# Patient Record
Sex: Female | Born: 1935 | Race: White | Hispanic: No | Marital: Married | Smoking: Former smoker
Health system: Southern US, Community
[De-identification: ages and names within clinical notes are randomized; demographics above are authoritative.]

## PROBLEM LIST (undated history)

## (undated) DIAGNOSIS — I2510 Atherosclerotic heart disease of native coronary artery without angina pectoris: Secondary | ICD-10-CM

## (undated) DIAGNOSIS — G9340 Encephalopathy, unspecified: Secondary | ICD-10-CM

## (undated) DIAGNOSIS — N390 Urinary tract infection, site not specified: Secondary | ICD-10-CM

## (undated) DIAGNOSIS — I7300 Raynaud's syndrome without gangrene: Secondary | ICD-10-CM

## (undated) DIAGNOSIS — M797 Fibromyalgia: Secondary | ICD-10-CM

## (undated) DIAGNOSIS — E785 Hyperlipidemia, unspecified: Secondary | ICD-10-CM

## (undated) DIAGNOSIS — I252 Old myocardial infarction: Secondary | ICD-10-CM

## (undated) DIAGNOSIS — E2740 Unspecified adrenocortical insufficiency: Secondary | ICD-10-CM

## (undated) DIAGNOSIS — C679 Malignant neoplasm of bladder, unspecified: Secondary | ICD-10-CM

## (undated) DIAGNOSIS — R600 Localized edema: Secondary | ICD-10-CM

## (undated) DIAGNOSIS — I10 Essential (primary) hypertension: Secondary | ICD-10-CM

## (undated) DIAGNOSIS — E119 Type 2 diabetes mellitus without complications: Secondary | ICD-10-CM

## (undated) DIAGNOSIS — G20 Parkinson's disease: Secondary | ICD-10-CM

## (undated) DIAGNOSIS — W19XXXA Unspecified fall, initial encounter: Secondary | ICD-10-CM

## (undated) DIAGNOSIS — K2270 Barrett's esophagus without dysplasia: Secondary | ICD-10-CM

## (undated) DIAGNOSIS — M1990 Unspecified osteoarthritis, unspecified site: Secondary | ICD-10-CM

## (undated) DIAGNOSIS — G20A1 Parkinson's disease without dyskinesia, without mention of fluctuations: Secondary | ICD-10-CM

## (undated) DIAGNOSIS — F419 Anxiety disorder, unspecified: Secondary | ICD-10-CM

## (undated) DIAGNOSIS — M419 Scoliosis, unspecified: Secondary | ICD-10-CM

## (undated) DIAGNOSIS — K279 Peptic ulcer, site unspecified, unspecified as acute or chronic, without hemorrhage or perforation: Secondary | ICD-10-CM

## (undated) DIAGNOSIS — Z8551 Personal history of malignant neoplasm of bladder: Secondary | ICD-10-CM

## (undated) HISTORY — PX: APPENDECTOMY: SHX54

## (undated) HISTORY — DX: Urinary tract infection, site not specified: N39.0

## (undated) HISTORY — PX: ABDOMINAL HYSTERECTOMY: SHX81

## (undated) HISTORY — PX: TONSILLECTOMY: SUR1361

---

## 1998-07-11 ENCOUNTER — Ambulatory Visit (HOSPITAL_COMMUNITY): Admission: RE | Admit: 1998-07-11 | Discharge: 1998-07-11 | Payer: Self-pay | Admitting: Gastroenterology

## 1998-08-30 ENCOUNTER — Ambulatory Visit (HOSPITAL_COMMUNITY): Admission: RE | Admit: 1998-08-30 | Discharge: 1998-08-30 | Payer: Self-pay | Admitting: Interventional Cardiology

## 2000-09-01 ENCOUNTER — Encounter: Admission: RE | Admit: 2000-09-01 | Discharge: 2000-09-01 | Payer: Self-pay | Admitting: Urology

## 2000-09-01 ENCOUNTER — Encounter: Payer: Self-pay | Admitting: Urology

## 2000-09-04 ENCOUNTER — Encounter: Payer: Self-pay | Admitting: Urology

## 2000-09-08 ENCOUNTER — Ambulatory Visit (HOSPITAL_COMMUNITY): Admission: RE | Admit: 2000-09-08 | Discharge: 2000-09-08 | Payer: Self-pay | Admitting: Urology

## 2000-09-08 ENCOUNTER — Encounter: Payer: Self-pay | Admitting: Urology

## 2001-06-15 ENCOUNTER — Encounter: Payer: Self-pay | Admitting: *Deleted

## 2001-06-15 ENCOUNTER — Ambulatory Visit (HOSPITAL_COMMUNITY): Admission: RE | Admit: 2001-06-15 | Discharge: 2001-06-15 | Payer: Self-pay | Admitting: *Deleted

## 2003-03-08 ENCOUNTER — Inpatient Hospital Stay (HOSPITAL_COMMUNITY): Admission: EM | Admit: 2003-03-08 | Discharge: 2003-03-11 | Payer: Self-pay | Admitting: Emergency Medicine

## 2003-03-08 ENCOUNTER — Encounter: Payer: Self-pay | Admitting: Emergency Medicine

## 2003-05-05 ENCOUNTER — Ambulatory Visit (HOSPITAL_COMMUNITY): Admission: RE | Admit: 2003-05-05 | Discharge: 2003-05-05 | Payer: Self-pay | Admitting: Family Medicine

## 2003-05-05 ENCOUNTER — Encounter: Payer: Self-pay | Admitting: Family Medicine

## 2003-05-11 ENCOUNTER — Ambulatory Visit (HOSPITAL_COMMUNITY): Admission: RE | Admit: 2003-05-11 | Discharge: 2003-05-11 | Payer: Self-pay | Admitting: Gastroenterology

## 2003-05-12 ENCOUNTER — Encounter (INDEPENDENT_AMBULATORY_CARE_PROVIDER_SITE_OTHER): Payer: Self-pay | Admitting: Specialist

## 2003-07-18 ENCOUNTER — Encounter: Payer: Self-pay | Admitting: *Deleted

## 2003-07-18 ENCOUNTER — Ambulatory Visit (HOSPITAL_COMMUNITY): Admission: RE | Admit: 2003-07-18 | Discharge: 2003-07-18 | Payer: Self-pay | Admitting: *Deleted

## 2003-08-21 ENCOUNTER — Ambulatory Visit (HOSPITAL_COMMUNITY): Admission: RE | Admit: 2003-08-21 | Discharge: 2003-08-21 | Payer: Self-pay | Admitting: Interventional Cardiology

## 2003-09-04 ENCOUNTER — Encounter (HOSPITAL_COMMUNITY): Admission: RE | Admit: 2003-09-04 | Discharge: 2003-11-04 | Payer: Self-pay | Admitting: Interventional Cardiology

## 2004-02-23 ENCOUNTER — Encounter: Admission: RE | Admit: 2004-02-23 | Discharge: 2004-05-07 | Payer: Self-pay | Admitting: Neurology

## 2004-03-01 ENCOUNTER — Ambulatory Visit (HOSPITAL_COMMUNITY): Admission: RE | Admit: 2004-03-01 | Discharge: 2004-03-01 | Payer: Self-pay | Admitting: Family Medicine

## 2004-03-14 ENCOUNTER — Other Ambulatory Visit: Admission: RE | Admit: 2004-03-14 | Discharge: 2004-03-14 | Payer: Self-pay | Admitting: Obstetrics and Gynecology

## 2004-05-22 ENCOUNTER — Observation Stay (HOSPITAL_COMMUNITY): Admission: EM | Admit: 2004-05-22 | Discharge: 2004-05-23 | Payer: Self-pay | Admitting: Emergency Medicine

## 2004-10-16 ENCOUNTER — Encounter: Admission: RE | Admit: 2004-10-16 | Discharge: 2004-10-16 | Payer: Self-pay | Admitting: Neurology

## 2004-10-30 ENCOUNTER — Encounter: Admission: RE | Admit: 2004-10-30 | Discharge: 2004-10-30 | Payer: Self-pay | Admitting: Neurology

## 2004-11-14 ENCOUNTER — Encounter: Admission: RE | Admit: 2004-11-14 | Discharge: 2004-11-14 | Payer: Self-pay | Admitting: Neurology

## 2004-12-28 ENCOUNTER — Observation Stay (HOSPITAL_COMMUNITY): Admission: EM | Admit: 2004-12-28 | Discharge: 2004-12-30 | Payer: Self-pay | Admitting: Emergency Medicine

## 2005-02-11 ENCOUNTER — Ambulatory Visit (HOSPITAL_COMMUNITY): Admission: RE | Admit: 2005-02-11 | Discharge: 2005-02-11 | Payer: Self-pay | Admitting: Interventional Cardiology

## 2005-08-16 ENCOUNTER — Emergency Department (HOSPITAL_COMMUNITY): Admission: EM | Admit: 2005-08-16 | Discharge: 2005-08-16 | Payer: Self-pay | Admitting: Emergency Medicine

## 2006-11-20 ENCOUNTER — Ambulatory Visit (HOSPITAL_COMMUNITY): Admission: RE | Admit: 2006-11-20 | Discharge: 2006-11-20 | Payer: Self-pay | Admitting: Family Medicine

## 2008-02-21 ENCOUNTER — Inpatient Hospital Stay (HOSPITAL_COMMUNITY): Admission: EM | Admit: 2008-02-21 | Discharge: 2008-03-01 | Payer: Self-pay | Admitting: Emergency Medicine

## 2008-02-22 ENCOUNTER — Encounter (INDEPENDENT_AMBULATORY_CARE_PROVIDER_SITE_OTHER): Payer: Self-pay | Admitting: Internal Medicine

## 2008-03-04 ENCOUNTER — Ambulatory Visit: Payer: Self-pay | Admitting: Internal Medicine

## 2008-03-04 ENCOUNTER — Inpatient Hospital Stay (HOSPITAL_COMMUNITY): Admission: EM | Admit: 2008-03-04 | Discharge: 2008-03-14 | Payer: Self-pay | Admitting: Emergency Medicine

## 2008-03-08 ENCOUNTER — Ambulatory Visit: Payer: Self-pay | Admitting: Physical Medicine & Rehabilitation

## 2008-04-29 ENCOUNTER — Emergency Department (HOSPITAL_COMMUNITY): Admission: EM | Admit: 2008-04-29 | Discharge: 2008-04-29 | Payer: Self-pay | Admitting: Emergency Medicine

## 2008-05-08 ENCOUNTER — Inpatient Hospital Stay (HOSPITAL_COMMUNITY): Admission: EM | Admit: 2008-05-08 | Discharge: 2008-05-18 | Payer: Self-pay | Admitting: Emergency Medicine

## 2008-05-08 ENCOUNTER — Ambulatory Visit: Payer: Self-pay | Admitting: Pulmonary Disease

## 2008-05-09 ENCOUNTER — Ambulatory Visit: Payer: Self-pay | Admitting: Infectious Diseases

## 2008-05-11 ENCOUNTER — Encounter (INDEPENDENT_AMBULATORY_CARE_PROVIDER_SITE_OTHER): Payer: Self-pay | Admitting: Interventional Cardiology

## 2008-05-17 ENCOUNTER — Ambulatory Visit: Payer: Self-pay | Admitting: Physical Medicine & Rehabilitation

## 2008-09-09 ENCOUNTER — Emergency Department (HOSPITAL_COMMUNITY): Admission: EM | Admit: 2008-09-09 | Discharge: 2008-09-09 | Payer: Self-pay | Admitting: Emergency Medicine

## 2008-10-07 ENCOUNTER — Emergency Department (HOSPITAL_COMMUNITY): Admission: EM | Admit: 2008-10-07 | Discharge: 2008-10-07 | Payer: Self-pay | Admitting: Emergency Medicine

## 2008-10-28 ENCOUNTER — Emergency Department (HOSPITAL_COMMUNITY): Admission: EM | Admit: 2008-10-28 | Discharge: 2008-10-29 | Payer: Self-pay | Admitting: Emergency Medicine

## 2008-11-16 ENCOUNTER — Inpatient Hospital Stay (HOSPITAL_COMMUNITY): Admission: EM | Admit: 2008-11-16 | Discharge: 2008-11-16 | Payer: Self-pay | Admitting: Emergency Medicine

## 2008-11-16 ENCOUNTER — Encounter (INDEPENDENT_AMBULATORY_CARE_PROVIDER_SITE_OTHER): Payer: Self-pay | Admitting: Internal Medicine

## 2008-11-16 ENCOUNTER — Ambulatory Visit: Payer: Self-pay | Admitting: Surgery

## 2008-12-18 ENCOUNTER — Ambulatory Visit (HOSPITAL_COMMUNITY): Admission: RE | Admit: 2008-12-18 | Discharge: 2008-12-18 | Payer: Self-pay | Admitting: Orthopedic Surgery

## 2009-02-15 ENCOUNTER — Ambulatory Visit (HOSPITAL_COMMUNITY): Admission: RE | Admit: 2009-02-15 | Discharge: 2009-02-15 | Payer: Self-pay | Admitting: Urology

## 2009-04-23 ENCOUNTER — Ambulatory Visit (HOSPITAL_COMMUNITY): Admission: RE | Admit: 2009-04-23 | Discharge: 2009-04-23 | Payer: Self-pay | Admitting: Urology

## 2009-06-05 ENCOUNTER — Emergency Department (HOSPITAL_COMMUNITY): Admission: EM | Admit: 2009-06-05 | Discharge: 2009-06-05 | Payer: Self-pay | Admitting: Emergency Medicine

## 2009-06-18 ENCOUNTER — Encounter: Admission: RE | Admit: 2009-06-18 | Discharge: 2009-06-18 | Payer: Self-pay | Admitting: Family Medicine

## 2009-06-24 ENCOUNTER — Inpatient Hospital Stay (HOSPITAL_COMMUNITY): Admission: EM | Admit: 2009-06-24 | Discharge: 2009-06-27 | Payer: Self-pay | Admitting: Emergency Medicine

## 2009-07-30 ENCOUNTER — Ambulatory Visit (HOSPITAL_COMMUNITY): Admission: RE | Admit: 2009-07-30 | Discharge: 2009-07-30 | Payer: Self-pay | Admitting: Urology

## 2010-01-15 ENCOUNTER — Ambulatory Visit (HOSPITAL_COMMUNITY): Admission: RE | Admit: 2010-01-15 | Discharge: 2010-01-15 | Payer: Self-pay | Admitting: Urology

## 2010-01-18 ENCOUNTER — Ambulatory Visit (HOSPITAL_COMMUNITY): Admission: RE | Admit: 2010-01-18 | Discharge: 2010-01-18 | Payer: Self-pay | Admitting: Orthopedic Surgery

## 2010-11-23 ENCOUNTER — Encounter: Payer: Self-pay | Admitting: Neurology

## 2011-01-26 LAB — CBC
HCT: 34.2 % — ABNORMAL LOW (ref 36.0–46.0)
Hemoglobin: 11 g/dL — ABNORMAL LOW (ref 12.0–15.0)
RBC: 3.72 MIL/uL — ABNORMAL LOW (ref 3.87–5.11)
RDW: 15.9 % — ABNORMAL HIGH (ref 11.5–15.5)

## 2011-01-26 LAB — BASIC METABOLIC PANEL
CO2: 25 mEq/L (ref 19–32)
Calcium: 8.9 mg/dL (ref 8.4–10.5)
GFR calc Af Amer: 42 mL/min — ABNORMAL LOW (ref >60)
GFR calc non Af Amer: 34 mL/min — ABNORMAL LOW (ref >60)
Glucose, Bld: 105 mg/dL — ABNORMAL HIGH (ref 70–99)
Potassium: 5 mEq/L (ref 3.5–5.1)
Sodium: 139 mEq/L (ref 135–145)

## 2011-02-07 LAB — BASIC METABOLIC PANEL
BUN: 75 mg/dL — ABNORMAL HIGH (ref 6–23)
CO2: 31 mEq/L (ref 19–32)
Glucose, Bld: 97 mg/dL (ref 70–99)
Potassium: 4.4 mEq/L (ref 3.5–5.1)
Sodium: 141 mEq/L (ref 135–145)

## 2011-02-07 LAB — URINE CULTURE: Colony Count: 60000

## 2011-02-08 LAB — COMPREHENSIVE METABOLIC PANEL
ALT: 10 U/L (ref 0–35)
ALT: 17 U/L (ref 0–35)
Albumin: 3 g/dL — ABNORMAL LOW (ref 3.5–5.2)
Alkaline Phosphatase: 60 U/L (ref 39–117)
BUN: 63 mg/dL — ABNORMAL HIGH (ref 6–23)
CO2: 21 mEq/L (ref 19–32)
Calcium: 8.7 mg/dL (ref 8.4–10.5)
Chloride: 101 mEq/L (ref 96–112)
GFR calc Af Amer: 23 mL/min — ABNORMAL LOW (ref >60)
Glucose, Bld: 108 mg/dL — ABNORMAL HIGH (ref 70–99)
Glucose, Bld: 77 mg/dL (ref 70–99)
Potassium: 4.2 mEq/L (ref 3.5–5.1)
Potassium: 5 mEq/L (ref 3.5–5.1)
Sodium: 128 mEq/L — ABNORMAL LOW (ref 135–145)
Sodium: 131 mEq/L — ABNORMAL LOW (ref 135–145)
Total Bilirubin: 0.8 mg/dL (ref 0.3–1.2)
Total Protein: 5.8 g/dL — ABNORMAL LOW (ref 6.0–8.3)
Total Protein: 6 g/dL (ref 6.0–8.3)

## 2011-02-08 LAB — CULTURE, BLOOD (ROUTINE X 2)

## 2011-02-08 LAB — CBC
HCT: 30.1 % — ABNORMAL LOW (ref 36.0–46.0)
HCT: 32.9 % — ABNORMAL LOW (ref 36.0–46.0)
Hemoglobin: 10.3 g/dL — ABNORMAL LOW (ref 12.0–15.0)
Hemoglobin: 10.5 g/dL — ABNORMAL LOW (ref 12.0–15.0)
Hemoglobin: 11.1 g/dL — ABNORMAL LOW (ref 12.0–15.0)
MCHC: 34.2 g/dL (ref 30.0–36.0)
MCHC: 33.7 g/dL (ref 30.0–36.0)
MCV: 94.4 fL (ref 78.0–100.0)
Platelets: 209 10*3/uL (ref 150–400)
Platelets: 254 10*3/uL (ref 150–400)
Platelets: 227 K/uL (ref 150–400)
RBC: 2.94 MIL/uL — ABNORMAL LOW (ref 3.87–5.11)
RBC: 3.21 MIL/uL — ABNORMAL LOW (ref 3.87–5.11)
RBC: 3.49 MIL/uL — ABNORMAL LOW (ref 3.87–5.11)
RDW: 13.6 % (ref 11.5–15.5)
RDW: 13.7 % (ref 11.5–15.5)
RDW: 13.8 % (ref 11.5–15.5)
RDW: 13.6 % (ref 11.5–15.5)
WBC: 5.1 10*3/uL (ref 4.0–10.5)
WBC: 9.4 10*3/uL (ref 4.0–10.5)
WBC: 9.6 10*3/uL (ref 4.0–10.5)
WBC: 4.5 10*3/uL (ref 4.0–10.5)

## 2011-02-08 LAB — POCT I-STAT, CHEM 8
BUN: 34 mg/dL — ABNORMAL HIGH (ref 6–23)
Calcium, Ion: 1.29 mmol/L (ref 1.12–1.32)
Chloride: 108 mEq/L (ref 96–112)
Creatinine, Ser: 1.3 mg/dL — ABNORMAL HIGH (ref 0.4–1.2)
Glucose, Bld: 101 mg/dL — ABNORMAL HIGH (ref 70–99)
HCT: 34 % — ABNORMAL LOW (ref 36.0–46.0)
Hemoglobin: 11.6 g/dL — ABNORMAL LOW (ref 12.0–15.0)
Potassium: 4.7 meq/L (ref 3.5–5.1)
Sodium: 141 meq/L (ref 135–145)
TCO2: 23 mmol/L (ref 0–100)

## 2011-02-08 LAB — BASIC METABOLIC PANEL
BUN: 40 mg/dL — ABNORMAL HIGH (ref 6–23)
Calcium: 7.8 mg/dL — ABNORMAL LOW (ref 8.4–10.5)
Calcium: 8.2 mg/dL — ABNORMAL LOW (ref 8.4–10.5)
Creatinine, Ser: 1.04 mg/dL (ref 0.4–1.2)
GFR calc Af Amer: >60 mL/min (ref >60)
GFR calc non Af Amer: 38 mL/min — ABNORMAL LOW (ref >60)
Glucose, Bld: 78 mg/dL (ref 70–99)
Sodium: 139 mEq/L (ref 135–145)

## 2011-02-08 LAB — URINALYSIS, ROUTINE W REFLEX MICROSCOPIC
Bilirubin Urine: NEGATIVE
Nitrite: NEGATIVE
Specific Gravity, Urine: 1.014 (ref 1.005–1.030)
Urobilinogen, UA: 0.2 mg/dL (ref 0.0–1.0)
pH: 5.5 (ref 5.0–8.0)

## 2011-02-08 LAB — DIFFERENTIAL
Basophils Absolute: 0 K/uL (ref 0.0–0.1)
Basophils Relative: 1 % (ref 0–1)
Eosinophils Absolute: 0 10*3/uL (ref 0.0–0.7)
Eosinophils Absolute: 0 K/uL (ref 0.0–0.7)
Eosinophils Relative: 0 % (ref 0–5)
Lymphocytes Relative: 29 % (ref 12–46)
Lymphs Abs: 1 10*3/uL (ref 0.7–4.0)
Lymphs Abs: 1.3 10*3/uL (ref 0.7–4.0)
Monocytes Absolute: 0.8 10*3/uL (ref 0.1–1.0)
Monocytes Absolute: 0.5 10*3/uL (ref 0.1–1.0)
Monocytes Relative: 8 % (ref 3–12)
Monocytes Relative: 11 % (ref 3–12)
Neutro Abs: 2.7 K/uL (ref 1.7–7.7)
Neutrophils Relative %: 83 % — ABNORMAL HIGH (ref 43–77)
Neutrophils Relative %: 60 % (ref 43–77)

## 2011-02-08 LAB — URINE MICROSCOPIC-ADD ON

## 2011-02-08 LAB — D-DIMER, QUANTITATIVE: D-Dimer, Quant: 0.25 ug/mL-FEU (ref 0.00–0.48)

## 2011-02-08 LAB — LIPASE, BLOOD: Lipase: 11 U/L (ref 11–59)

## 2011-02-08 LAB — SODIUM, URINE, RANDOM: Sodium, Ur: 17 mEq/L

## 2011-02-08 LAB — T4, FREE: Free T4: 1.4 ng/dL (ref 0.80–1.80)

## 2011-02-08 LAB — OSMOLALITY, URINE: Osmolality, Ur: 503 mOsm/kg (ref 390–1090)

## 2011-02-08 LAB — CORTISOL: Cortisol, Plasma: 13.8 ug/dL

## 2011-02-08 LAB — CREATININE, URINE, RANDOM: Creatinine, Urine: 48.8 mg/dL

## 2011-02-08 LAB — URINE CULTURE

## 2011-02-10 LAB — BASIC METABOLIC PANEL
CO2: 26 mEq/L (ref 19–32)
Chloride: 107 mEq/L (ref 96–112)
Creatinine, Ser: 1.39 mg/dL — ABNORMAL HIGH (ref 0.4–1.2)
GFR calc Af Amer: 44 mL/min — ABNORMAL LOW (ref >60)

## 2011-02-10 LAB — HEMOGLOBIN AND HEMATOCRIT, BLOOD: HCT: 34.8 % — ABNORMAL LOW (ref 36.0–46.0)

## 2011-02-12 LAB — BASIC METABOLIC PANEL
CO2: 22 mEq/L (ref 19–32)
Calcium: 9.3 mg/dL (ref 8.4–10.5)
Creatinine, Ser: 1.07 mg/dL (ref 0.4–1.2)
GFR calc Af Amer: 60 mL/min — ABNORMAL LOW (ref >60)
GFR calc non Af Amer: 49 mL/min — ABNORMAL LOW (ref >60)
Glucose, Bld: 89 mg/dL (ref 70–99)

## 2011-02-12 LAB — GLUCOSE, CAPILLARY
Glucose-Capillary: 72 mg/dL (ref 70–99)
Glucose-Capillary: 84 mg/dL (ref 70–99)

## 2011-02-17 LAB — COMPREHENSIVE METABOLIC PANEL
ALT: 13 U/L (ref 0–35)
AST: 23 U/L (ref 0–37)
Calcium: 9.6 mg/dL (ref 8.4–10.5)
Creatinine, Ser: 1.23 mg/dL — ABNORMAL HIGH (ref 0.4–1.2)
GFR calc Af Amer: 51 mL/min — ABNORMAL LOW (ref >60)
Sodium: 140 mEq/L (ref 135–145)
Total Protein: 6.5 g/dL (ref 6.0–8.3)

## 2011-02-17 LAB — RAPID URINE DRUG SCREEN, HOSP PERFORMED
Amphetamines: NOT DETECTED
Benzodiazepines: POSITIVE — AB
Cocaine: NOT DETECTED
Tetrahydrocannabinol: NOT DETECTED

## 2011-02-17 LAB — BLOOD GAS, ARTERIAL
Acid-Base Excess: 1.7 mmol/L (ref 0.0–2.0)
Bicarbonate: 25.7 mEq/L — ABNORMAL HIGH (ref 20.0–24.0)
O2 Saturation: 94.6 %
TCO2: 23.4 mmol/L (ref 0–100)
pO2, Arterial: 72.3 mmHg — ABNORMAL LOW (ref 80.0–100.0)

## 2011-02-17 LAB — CK TOTAL AND CKMB (NOT AT ARMC)
CK, MB: 2.2 ng/mL (ref 0.3–4.0)
Relative Index: INVALID (ref 0.0–2.5)
Total CK: 38 U/L (ref 7–177)

## 2011-02-17 LAB — URINALYSIS, ROUTINE W REFLEX MICROSCOPIC
Ketones, ur: NEGATIVE mg/dL
Nitrite: NEGATIVE
Protein, ur: NEGATIVE mg/dL

## 2011-02-17 LAB — BASIC METABOLIC PANEL
CO2: 26 mEq/L (ref 19–32)
GFR calc non Af Amer: 35 mL/min — ABNORMAL LOW (ref >60)
Glucose, Bld: 108 mg/dL — ABNORMAL HIGH (ref 70–99)
Potassium: 4.3 mEq/L (ref 3.5–5.1)
Sodium: 138 mEq/L (ref 135–145)

## 2011-02-17 LAB — POCT CARDIAC MARKERS

## 2011-02-17 LAB — CBC
HCT: 38.4 % (ref 36.0–46.0)
Hemoglobin: 12.8 g/dL (ref 12.0–15.0)
RDW: 15.9 % — ABNORMAL HIGH (ref 11.5–15.5)

## 2011-02-17 LAB — DIFFERENTIAL
Basophils Absolute: 0.1 10*3/uL (ref 0.0–0.1)
Eosinophils Relative: 0 % (ref 0–5)
Lymphocytes Relative: 24 % (ref 12–46)
Lymphs Abs: 1.5 10*3/uL (ref 0.7–4.0)
Monocytes Absolute: 0.6 10*3/uL (ref 0.1–1.0)
Monocytes Relative: 10 % (ref 3–12)

## 2011-02-17 LAB — HEMOGLOBIN A1C: Hgb A1c MFr Bld: 6.3 % — ABNORMAL HIGH (ref 4.6–6.1)

## 2011-02-17 LAB — APTT: aPTT: 35 seconds (ref 24–37)

## 2011-02-17 LAB — GLUCOSE, CAPILLARY: Glucose-Capillary: 111 mg/dL — ABNORMAL HIGH (ref 70–99)

## 2011-02-17 LAB — PROTIME-INR
INR: 1.1 (ref 0.00–1.49)
Prothrombin Time: 14.7 seconds (ref 11.6–15.2)

## 2011-02-17 LAB — URINE CULTURE: Culture: NO GROWTH

## 2011-03-18 NOTE — Discharge Summary (Signed)
[name], [name]                 ACCOUNT NO.:  [id number]   MEDICAL RECORD NO.:  [id number]          PATIENT TYPE:  INP   LOCATION:  1438                         FACILITY:  [facility]   PHYSICIAN:  Hollice Espy, M.D.DATE OF BIRTH:  08/23/1928   DATE OF ADMISSION:  02/21/2008  DATE OF DISCHARGE:  03/01/2008                               DISCHARGE SUMMARY   PRIMARY CARE PHYSICIAN:  Dr. [name].   CONSULTANTS:  Dr. Avie Echevaria, neurology   DISCHARGE DIAGNOSES:  1. Urinary tract infection.  2. Septic shock.  3. Parkinson's with acute flare leading to joint rigidity.  4. Deconditioning.  5. Shortness of breath, possibly volume overload.  6. Protein/calorie malnutrition.  7. Anasarca, improving.  8. Diabetes mellitus.  9. History of coronary artery disease.  10.Hypertension.  11.Multiple drug allergies.   HOSPITAL COURSE:  1. In regard to the patient's UTI and sepsis, the patient has a      history of multiple drug allergies and is a 77 year old white      female who had been started on Macrobid for a UTI, but started to      feel worse despite the antibiotics started for several days as an      outpatient.  When she came to the emergency room on February 21, 2008,      she was found to be hypotensive with a blood pressure of 70/40.      She was given 4 liters of IV fluid and transiently started on      dopamine which increased her blood pressure 190/100, and heart rate      up to 140 and thus her dopamine was stopped.  As soon as her      dopamine was stopped, her blood pressure fell back down into the      70s systolic, so she was brought into the hospitalist service and      sent over to the ICU.  Because of her multiple drug allergies, the      patient was started on IV Zosyn.  Because of previous outpatient      antibiotics her urine cultures never grew anything positive, but      she showed all signs of systemic inflammatory response and early      signs of sepsis  from his urinary infection.  After being started on      dopamine and IV fluids for several days her pressure began to      improve.  She was able to be weaned off of dopamine and she      remained stable.  After several days, she was able to be      transferred up to the floor.  She remained stable from an      infectious disease standpoint and by March 01, 2008, will have      completed 10 days of IV Zosyn.  We have been unable to transition      her to a p.o. medication because we do not have cultures nor do we      have a  p.o. medication that she tolerated without possible allergy.  2. In regard to her Parkinson disease, the patient already had      problems prior to admission with muscle rigidity.  Dr. Sandria Manly from      neurology was consulted who saw the patient.  She previously had      been on selegiline and Neurontin.  The selegiline was restarted at      a lower dose and as patient has progressed during her      hospitalization from severe weakness where she could barely move      her legs and arms, to the point where she has increased      significantly in her upper body and upper extremities in terms of      strength, motor control and function, Dr. Sandria Manly has continued to      follow the patient and slightly adjusted her medications.  Please      see below for medication list.  The patient was evaluated by PT and      OT.  In spite of her improvement, she is felt to still be severely      deconditioned and will go from the hospital to a skilled nursing      facility for rehabilitation.  3. In regards to the patient's anasarca and protein/calorie      malnutrition, the patient was complaining of generalized swelling.      Her IV fluids were able to be weaned off after her renal failure      had improved.  Her initial renal failure was felt to be secondary      to hypotension and poor perfusion.  Her renal function was restored      to normal and over the next several several days,  her albumin      stores were checked and she was found be malnourished with an      albumin of 2.0.  It was felt likely that a lot of her edema was      secondary to third spacing of fluids from poor protein      malnutrition.  She was started on Megace which she has been      tolerating well.  Her appetite has slowly improved and she started      to do some auto diuresis.  She received multiple doses of Lasix in      addition and that has helped with some of her swelling.  4. In regard to the patient's renal failure, again this is felt to be      secondary to initial hypotension and shock.  With IV fluids this      has restored back to normal function.  5. In regard to her diabetes mellitus, metformin was held given her      poor p.o. intake, her hypoglycemia, her slightly elevated liver      enzymes on admission, and her poor functional state, along with her      history of CAD.  Her blood sugars have remained stable without      anything other than sliding-scale insulin, ranging anywhere from      the 110s to 150s.  Plan will be for the patient to at this time      discontinue her metformin, continue to watch her sugars and should      her CBG start to trend up, she may be started on different      medication, but we  will defer this to her PCP.  6. In regards to her history of CAD, the patient is continued on her      Plavix, simvastatin and aspirin.  This has been a stable medical      issues.  7. In regards to her shortness of breath, the patient was noted to be      complaining of some increased episodes of wheezing and shortness of      breath during her hospitalization for the first few days.  There      was a concern about aspiration, so we had a swallowing evaluation      which was negative, although they did recommend that especially      with her initial bed bound status that she sit upright, continue a      regular diet with thin liquids and supervision.  This has since       improved, and this did not appear to be an issue.  Initially there      was concern about the possibility of antibiotic allergy, however      she has remained on Zosyn with no other antibiotics and her      wheezing has resolved.  Also concerned about the possibility of      volume overload and with multiple doses of Lasix, however this did      not seem to change things either.  It is possible she may have been      having some trace aspiration with her limited strength, but once      she was able to sit more upright in a chair and eat, and with her      strength increased then she was able to feed herself.  The wheezing      and shortness breath seems to have resolved.  There were no signs      of any type of pneumonia based on x-ray.   From initial presentation, her disposition has improved.   ACTIVITY:  Her activity will be as per skilled nursing.   DIET:  Her discharge diet at this time will be carb-modified, heart  healthy diet.   FOLLOWUP:  She is to follow up with Dr. Sandria Manly and with Dr. Katrinka Blazing in the  next 1-2 weeks following discharge.   PLAN:  Plan will be for the patient go to skilled nursing facility.  Again her overall disposition is improved and she will follow up with  Dr. Sandria Manly as an outpatient.   DISCHARGE MEDICATIONS:  As of this dictation, her discharge medications  are as follows:  1. Xanax 0.5 p.o. q.h.s.  2. Neurontin 100 mg p.o. t.i.d.  3. Lisinopril 5 p.o. daily.  4. She was on p.o. Macrobid prior to coming in, this has been      discontinued as she completed a full 10-day course of IV Zosyn in      the hospital.  5. Metformin is recommended to be discontinued, as she has poor p.o.      intake and her history of CAD.  6. Prilosec 20 mg p.o. daily.  7. Plavix 75 p.o. daily.  8. Aspirin 81 p.o. daily.  9. Simvastatin 40 p.o. daily.  10.Mirapex 1 mg p.o. t.i.d. given at 7:00 a.m., 12:00 p.m. and 4:00      p.m., started by Dr. Sandria Manly.  11.Megace 40 mg p.o.  daily.  12.Eldepryl will be increased to 5 mg p.o. twice a day at 8:00 a.m.  and 12:00 p.m.  13.Sinemet one-half tablet of 25/100 mg p.o. t.i.d.  14.Albuterol inhaler 2 puffs four times a day p.r.n.      Hollice Espy, M.D.  Electronically Signed     SKK/MEDQ  D:  02/29/2008  T:  02/29/2008  Job:  [id number]   cc:   Genene Churn. Love, M.D.  Fax: [phone]   Dario Guardian, M.D.  Fax: [phone]

## 2011-03-18 NOTE — Consult Note (Signed)
[name], [name] NO.:  [id number]   MEDICAL RECORD NO.:  [id number]          PATIENT TYPE:  INP   LOCATION:  1238                         FACILITY:  [facility]   PHYSICIAN:  Genene Churn. Love, M.D.    DATE OF BIRTH:  1928/01/04   DATE OF CONSULTATION:  02/22/2008  DATE OF DISCHARGE:                                 CONSULTATION   CHIEF COMPLAINT:  This 77 year old right-handed white married female is  admitted to [facility] February 23, 2008 for evaluation of  hypotension and suspected urosepsis.   HISTORY OF PRESENT ILLNESS:  [name] has a history of generalized  weakness associated with dizziness and 24 hours of hematuria beginning  February 16, 2008.  She was seen by her personal physician, Dr. [name] and on Thursday February 18, 2008 by Dr. Darvin Neighbours urologist PA.  At  that time urine cultures were obtained.  She was not started on  antibiotics.  She was seen by me in my office on February 20, 2008, the  next day, at which time her white blood cell count turned out to be  greater than 20,000. Creatinine was over 2.5.  Her blood pressures was  in the 90-100 range.  She was thought most likely to have a urinary  tract infection.  She has multiple drug allergies and cultures were  still pending.  She was admitted February 21, 2008 with evidence of  urosepsis.  She was hypotensive with blood pressures in the 70-90  systolic range.  She was placed on Zosyn and a cortisol level was  obtained.  Her initial white blood cell count was 12,300 with hemoglobin  11.1 and platelets to 281 K.  Today on February 22, 2008 white blood cell  count 9800, hemoglobin 9.2, and the platelet count was 233 K.  Initial  sodium was 130, potassium 3.7, chloride 99.9, CO2 content 19 and glucose  of 137.  Her SGOT and SGPT were slightly elevated with values of 67 and  55 respectively which have become 70 and 55.  Her INR was 1.4.  It was  felt that she may have shock liver with mildly  elevated liver function  tests.  During the evening time she was treated with Zosyn and IV fluid  pushes for blood pressures in the 70 systolic range.   MEDICATIONS:  Her medications at home have been:  1. Lisinopril 5 mg one p.o. b.i.d.  2. Mirapex 1.5 mg t.i.d.  3. Selegiline 5 mg b.i.d.  4. Lipitor 10 mg daily.  5. Metoprolol 50 mg daily.  6. Alprazolam 0.5 mg nightly.  7. Plavix 75 mg per day.  8. Prilosec 20 mg per day.  9. Metformin 500 mg daily.  10.Zocor 40 mg daily.  11.Ocuvite vitamins.   Her medications in the hospital include:  1. Zosyn 3.375 grams q. 8 hours.  2. MiraLax 17 grams p.o. b.i.d.  3. Tylenol 650 mg q. 4 hours p.r.n.  4. Zocor 40 mg daily.  5. Xanax 0.5 mg nightly.  6. Sodium chloride 1000 mg q. 4 hours.  7. Protonix 40 mg daily.  8. Prinivil 5 mg daily.  9. Plavix 75 mg daily.  10.NovoLog 1-9 units subcu q. 4 hours.  11.Neurontin 100 mg p.o. t.i.d.  12.Lovenox 40 mg subcu daily.  13.Eldepryl 5 mg p.o. b.i.d.  14.Colace 100 mg b.i.d.  15.Aspirin 81 mg p.o. daily.   PHYSICAL EXAMINATION:  GENERAL:  Examination revealed a well-developed  white female.  VITAL SIGNS:  Blood pressure 74/36, heart rate was 63.  She was  afebrile.  NECK:  No bruits heard.  MENTAL STATUS:  She was alert and oriented x3.  She followed one, two  and three-step commands.  NEUROLOGIC:  Her cranial nerve examination revealed visual fields to be  full.  Both disks were seen and flat.  She is status post cataract  surgery bilaterally.  Extraocular movements were full.  Face was  symmetric.  Tongue was midline.  The uvula was midline.  Gags were  present.  Her voice was strong.  Her motor examination revealed mild  increased tone.  No cogwheeling.  Strength was 4+/5 in her upper and  lower extremities.  She had no resting tremor but had some mild  outstretched hand arm tremor.  She had absent ankle reflexes.  Right  plantar response was upgoing.  The left plantar response was  downgoing.   IMPRESSION:  1. Parkinson's.  Code 332.0.  2. Urosepsis with urinary tract infection and hematuria. Code 599.0.  3. History of coronary artery disease, code 429.2.  4. Multiple drug allergies to CIPRO, NITROFURANTOIN, OXYCODONE,      CODEINE, TRIMETHOPRIM, ETC.  5. Remote history of bladder cancer.  6. Hypotension.  7. Diabetes mellitus. Code 250.6.   PLAN:  Plan at this time is to restart her Parkinson's medicines at low  dose.           ______________________________  Genene Churn. Sandria Manly, M.D.     JML/MEDQ  D:  02/22/2008  T:  02/22/2008  Job:  [id number]

## 2011-03-18 NOTE — H&P (Signed)
[name], COTRELL                 ACCOUNT NO.:  [id number]   MEDICAL RECORD NO.:  [id number]          PATIENT TYPE:  INP   LOCATION:  0104                         FACILITY:  [facility]   PHYSICIAN:  Michiel Cowboy, MDDATE OF BIRTH:  Nov 17, 1927   DATE OF ADMISSION:  11/15/2008  DATE OF DISCHARGE:                              HISTORY & PHYSICAL   PRIMARY CARE Eliza Green:  Dario Guardian, M.D.   CHIEF COMPLAINT:  Altered mental status, vertigo, confusion.   Patient is a 77 year old female with a history of Parkinson's disorder,  recurrent urinary tract infection with a few admissions for urosepsis,  recurrent orthostatic hypotension, thought to be partially secondary to  Parkinson's disorder, and also secondary to adrenal insufficiency.  Patient had a lumbar fracture since her last discharge in July.  Has  lost over 30 pounds, felt secondary to decreased p.o. intake and overall  not feeling well.  The reason why her family brought her in today, the  past few days have been feeling somewhat more sleepy and overall not  well but in particular for the past 1 day, patient had a lot of vertigo,  difficulty in walking, speech which was in unfinished sentences, and a  few words spoken but otherwise not a full conversation.  The patient  denies any fevers but endorses some chills.  Had neck pain in the past  but not currently.  No meningismus.  No current headache.  No chest pain  or shortness of breath.  The patient endorses a poor appetite and a hard  time walking.  Also severe fatigue.  She feels like she is constantly  falling asleep.  A sensation of vertigo.  Otherwise review of systems is  unremarkable.   PAST MEDICAL HISTORY:  1. Arthritis.  2. History of bladder cancer.  3. History of fibromyalgia.  4. Hypertension.  5. History of paroxysmal atrial fibrillation while ill. No      anticoagulation at that time, given that a short duration of A fib,      but that should be  reconsidered if the patient develops prolonged      recurrent A fib.  6. Recurrent E. coli urinary tract infection.  7. Congestive heart failure with EF of 40-45%.  8. Diabetes mellitus, type 2, diet-controlled.  9. History of coronary artery disease, status post statin in 2005.  10.Drug-eluting stent placement.  11.History of severe scoliosis.  12.Parkinson's disease.  13.Adrenal insufficiency.  14.GERD.  15.History of Barrett's esophagus.  16.Raynaud's syndrome.  17.History of renal failure in the past.  Of note, per her physician,      she had a recent creatinine up to 2, now down to 1.5 after      encouragement of fluids.   SOCIAL HISTORY:  Patient lives with her husband, who recently had  undergone surgery.  Does not smoke or drink currently.   FAMILY HISTORY:  Noncontributory.   ALLERGIES:  CIPRO, CODEINE, HYDROCODONE, MACROBID, OXYCODONE,  PENICILLIN, SULFA, TRIMETHOPRIM.   MEDICATIONS:  1. Alprazolam 0.5 mg p.o. q.p.m.  2. Amantidine 100 mg at 1 p.m.  3. Sinemet 25/100 mg b.i.d.  4. Lisinopril 5 mg p.o. daily.  5. Mirapex 1 mg at 9 a.m. and 1 p.m. and 0.5 mg at 6 p.m.  6. __________ 5 mg p.o. at 9 a.m. and 1 p.m.  7. Zocor 40 mg daily.  8. Furosemide 40 mg daily.  9. Midodrine 5 mg b.i.d.  10.KCL supplement over the counter.  11.Aspirin 81 mg daily.  12.Calcium with vitamin D.  13.Beta carotene.  14.Prilosec as needed.  15.Ocuvite.  16.Ferrous sulfate 325 mg daily.  17.Multivitamins.  18.Tums.  19.Patient uses p.r.n. hydrocodone, Skelaxin, Senna, and albuterol.  20.Patient is currently holding Neurontin and hydrochlorothiazide.   PHYSICAL EXAMINATION:  VITALS:  Temperature 95.9, blood pressure 114/63,  now up to 181/66.  Patient has a history of labile blood pressure.  Pulse 67, respirations 22, satting at 97% on room air.  Patient appears to be in no acute distress.  Currently, she is sleeping  but arousable.  Able to carry on a conversation, drifts off  back to  sleep.  Head nontraumatic.  Somewhat dry mucous membranes.  LUNGS:  Clear to auscultation bilaterally.  HEART:  Regular rate and rhythm.  No murmurs, rubs or gallops.  ABDOMEN:  Soft, nontender, nondistended.  LOWER EXTREMITIES:  Without clubbing, cyanosis or edema.  Patient appears to be neurologically intact.   LABS:  White blood cell count 6.2, hemoglobin 12.8.  Sodium 138,  potassium 4.3, creatinine 1.45, calcium 10.9.  UTOX positive for  benzodiazepines, which patient is prescribed.  Alcohol level less than  5.  UA negative.  ABGs 7.421/40/72.3.   CT scan of the head showed left maxillary sinus disease.   CT scan of the neck unchanged from prior.   Chest x-ray showing cardiomegaly, otherwise unremarkable.   Of note, no EKG was obtained.   ASSESSMENT/PLAN:  This is a 77 year old female with recurrent urinary  tract infections and overall loss of weight.  New symptoms of vertigo  and confusion.  Unsteady gait and difficulty speaking as well as a  history of paroxysmal atrial fibrillation in the past and a history of  severe Parkinson's disease and renal insufficiency with a labile blood  pressure.  1. Altered mental status:  Differential could  be brought, could be      underlying infection, although currently no sources.  Will hold off      antibiotics and to determine what the sources.  Also could be the      possibility related to a transient ischemic attack and      cerebrovascular accident, given unsteady gait, which is new, as      well as difficulty in speaking well.  Had a TIA/CVA workup done.      Patient had recent echocardiogram.  Will skip that but will do an      MRI/MRA of the brain.  Carotid Dopplers.  Patient has a history of      A fib in the past.  Will obtain a 12-lead EKG to confirm sinus      rhythm.  Does not appear to be irregular currently.  Will admit to      telemetry.  Cycle cardiac enzymes.  This could be typical anginal      symptoms,  consistent with myocardial infarction, but much less      likely.  Check fasting lipid panel, hemoglobin A1C.  Check TSH.  2. History of diabetes:  Will continue sliding scale.  3. Labile systolic blood pressure:  Will  avoid over-treatment.      Continue home meds.  Otherwise will not start on anything new.      Patient usually has very frequent fluctuations of her blood      pressure throughout the day.  4. Acute renal failure:  By comparison to labs before, seemed to be      improving, since down from 2 to 1.5.  Will continue gentle IV      fluids.  Will obtain renal ultrasound in the a.m. to determine if      there is any evidence of obstruction or current nephrotoxics that      could contribute to this.  5. Weight loss:  Patient will likely need to have a workup done for      this.  Will check morning pre-albumin to see where patient's stands      nutrionally.  Consider nutrition consult.  Also consider cancer      workup.  6. Parkinson's:  Continue her home medications as prescribed.  7. Prophylaxis:  Protonix.  SCDs.  8. Code status:  Per family, patient is DNR/DNI but allowed placement      of central line the last admission.      Michiel Cowboy, MD  Electronically Signed     AVD/MEDQ  D:  11/16/2008  T:  11/16/2008  Job:  [id number]   cc:   Dario Guardian, M.D.  Fax: [phone]

## 2011-03-18 NOTE — Op Note (Signed)
NAMEKEYERRA, LAMERE                 ACCOUNT NO.:  [id number]   MEDICAL RECORD NO.:  [id number]          PATIENT TYPE:  AMB   LOCATION:  DAY                          FACILITY[facility]  [facility]   PHYSICIAN:  Martina Sinner, MD DATE OF BIRTH:  [birth year]   DATE OF PROCEDURE:  02/15/2009  DATE OF DISCHARGE:                               OPERATIVE REPORT   PREOPERATIVE DIAGNOSIS:  Neurogenic bladder, refractory urge  incontinence, muscle spasm.   POSTOPERATIVE DIAGNOSIS:  Neurogenic bladder, refractory urge  incontinence, muscle spasm.   PROCEDURE PERFORMED:  Cystoscopy, hydrodistention, Botox injection  therapy.   DESCRIPTION OF PROCEDURE:  Ms. Nicol Herbig has refractory urge  incontinence with the above diagnosis.  She has multiple allergies.  She  was prepped and draped in usual fashion.  Gentamicin was given prior to  procedure.   The ACMI scope was utilized.  She had a few white flecks in her urine.  I sent her urine for culture.  Bladder mucosa and trigone were otherwise  normal.  She was hydrodistended to 550 mL and on reinspection there was  no glomerulations.  I injected 20 units of Botox instilled in 20 cc of  normal saline using my usual template at 5 and 7 o'clock and cephalad to  the trigone burying the trigone.  There was no bleeding.  Bladder was  emptied.  The patient was taken to recovery room.           ______________________________  Martina Sinner, MD  Electronically Signed     SAM/MEDQ  D:  02/15/2009  T:  02/15/2009  Job:  [id number]

## 2011-03-18 NOTE — Discharge Summary (Signed)
[name], [name]                 ACCOUNT NO.:  [id number]   MEDICAL RECORD NO.:  [id number]          PATIENT TYPE:  INP   LOCATION:  1424                         FACILITY:  [facility]   PHYSICIAN:  Hollice Espy, M.D.DATE OF BIRTH:  [birth year]   DATE OF ADMISSION:  06/24/2009  DATE OF DISCHARGE:  06/27/2009                               DISCHARGE SUMMARY   ATTENDING PHYSICIAN:  Hollice Espy, M.D.   PRIMARY CARE PHYSICIAN:  Dr. [name].   DISCHARGE DIAGNOSES:  1. Ischemic colitis.  2. Elevated TSH level.  3. Acute renal failure secondary #1, now resolved.  4. History of hypertension.  5. History of coronary artery disease with a minimally decreased      ejection fraction.  6. History of chronic back pain.  7. Hypotension secondary to #1, now resolved.   DISCHARGE MEDICATIONS:  The only medication change the patient will have  will be aspirin which is being increased from 81 to 325.  The patient  will continue the rest for medicines.   1. Xanax 0.5 p.o. q.h.s.  2. Amantadine 100 p.o. at 1:00 p.m.  3. Carbidopa/levodopa 25/100.  4. Lisinopril 5 p.o. daily,  5. Mirapex 0.75 at 9 a.m.,1 p.m., 6:00 p.m.  6. Eldepryl 5 mg p.o. b.i.d.  7. Zocor 40 p.o. q.h.s.  8. Os-Cal 500 p.o. b.i.d.  9. Fish oil p.o. b.i.d.  10.Prilosec over-the-counter daily,  11.Ocuvite p.o. daily.  12.Iron 325 p.o. daily.  13.Multivitamin p.o. daily.  14.Vicodin p.r.n.  15.Senokot p.r.n.  16.Albuterol p.r.n.  17.Meclizine p.r.n.   HOSPITAL COURSE:  The patient is a 77 year old white female, past  medical history of CAD, who presented on August 22, complaining of  weakness.  She was found to have abdominal pain, fevers, chills.  She  had no white count.  A CT scan of the abdomen showed signs consistent  with a colitis.  She had no diarrhea or blood in her stool and the  findings, given her heart history, were more consistent with ischemic  colitis, rather than an infectious process.  No  antibiotics were  started.  The patient was made n.p.o.  She was also found to be in acute  renal failure from severe dehydration.  Her BUN on admission was noted  to be 64  with creatinine 2.45.  Previously her renal function had been  normal.  The patient was started on IV fluids, made n.p.o.  Again,  antibiotics were held.  She had no fevers and over the next several days  her renal function continued to improve.  She started feeling better.  She was started on clear liquids on August 24.  This was advanced and by  the evening of August 24 she was tolerating solid food.  By August 25,  her renal function is completely normalized and she did quite well.  She  was continued on all of her medicines except for aspirin, which was  increased to 325, given signs of ischemic colitis, and her ACE  inhibitor, given her renal dysfunction.  By August 25 she was doing much  better,  tolerating p.o., and felt to be medically stable for discharge.  During initial workup, a TSH level was drawn.  This returned back on  August 24, elevated at 7.58.  Given her acute illness, we will go ahead  and check a free T4 level and have plans for the patient to follow-up  appointment with Dr. [name].  At that time Dr. Katrinka Blazing can  determine, based on her T4 level, whether or not to repeat labs or start  her on some low-dose Synthroid medication.  The rest of the patient's  medical issues were stable during this hospitalization.   DISPOSITION:  The patient's overall disposition is improved.   ACTIVITY:  Activity will be slowly increased.   DISCHARGE DIET:  Heart healthy diet.   She is being discharged to home.  She will follow up with PCP, Dr.  [name], in one week's time.      Hollice Espy, M.D.  Electronically Signed     SKK/MEDQ  D:  06/27/2009  T:  06/27/2009  Job:  [id number]   cc:   [name], M.D.  Fax: [phone]

## 2011-03-18 NOTE — H&P (Signed)
[name], [name] NO.:  [id number]   MEDICAL RECORD NO.:  [id number]          PATIENT TYPE:  EMS   LOCATION:  MAJO                         FACILITY:  [facility]   PHYSICIAN:  Hollice Espy, M.D.DATE OF BIRTH:  November 07, 1927   DATE OF ADMISSION:  05/08/2008  DATE OF DISCHARGE:                              HISTORY & PHYSICAL   PRIMARY CARE PHYSICIAN:  Dario Guardian, M.D.   NEUROLOGIST:  Evie Lacks, M.D.   ENDOCRINOLOGIST:  Dorisann Frames, M.D.   CHIEF COMPLAINT:  Drowsiness.   HISTORY OF PRESENT ILLNESS:  Patient is a 77 year old white female with  a past medical history of urosepsis.  In fact, she was just discharged  approximately six weeks ago for the same, at that time growing out VRE  as well as a history of Parkinson's disease and diabetes, who followed  up with her urologist one week ago.  At that time, she had lab work  done, including a urologist.  Her husband has noted that over the course  of July 4th weekend, she has been somewhat drowsy.  He was concerned  about the recurrence of another urinary infection.  She appeared to be  rallying and doing well on Sunday the 5th; however, today she appeared  to be more somnolent, so he brought her into the emergency room.  In the  emergency room, she had labs drawn, and she was found to have a BUN of  64 with a creatinine of 1.9 with her baseline being a creatinine of 1.2.  Her white count was elevated at 19.2 with a 97% shift.  A urinalysis was  done which noted large blood and moderate leukocytes; however, this was  nitrite negative.  Patient was noted to be initially hypotensive with a  systolic blood pressure in the 60s.  She received a fluid bolus and  still remained hypotensive, so we started briefly on Levophed.  Her  pressures climbed up into the 140s; however, when this was weaned off,  she fell back down into the 80s systolic.  She has been continued on IV  fluids since and pressures around 85  now currently.  The patient herself  is quite drowsy.  She is not able to give me any kind of review of  systems.  Her previous history is obtained both from old medical records  plus talking to her family.   The ER attending spoke with her urologist and reviewed her lab work.  She is found to have apparently sensitivities on the urine taken one  week ago, came back sensitive to tobramycin, which has been already  ordered in the emergency room.  Patient has had no elevated  temperatures, but she has had brief spikes in her heart rate in the 140s  while on the Levophed.   PAST MEDICAL HISTORY:  History of urosepsis with previous UTIs with VRE,  diabetes mellitus, Parkinson's disease, GERD, history of adrenal  insufficiency, labile blood pressure, history of CAD, hypertension,  fibromyalgia, Barrett's esophagus, dehydration, and Raynaud's syndrome.   MEDICATIONS:  1. Aspirin 81.  2.  Sinemet 25/100 3 times a day at 7, noon, and 5 p.m.  3. Xanax 0.5 mg p.o. nightly.  4. Iron 325 p.o. daily.  5. Neurontin 100 p.o. t.i.d.  6. Megace 400 p.o. daily.  7. Plavix 75, which is currently on hold, as the patient is getting      spinal injections.  8. Protonix 40.  9. Selegiline 5 mg p.o. b.i.d.  10.Mirapex 1 mg at 7, noon, and 0.5 mg at 5 p.m.  11.Beta-carotene 1 tab p.o. daily.  12.Prednisone 5 mg in the morning, 2.5 mg at noon.  13.Midodrine 2.5 at 7 a.m. and 5 p.m.  14.Zocor 40.  15.Ventolin inhaler q.4h. p.r.n.   Patient has multiple allergies, including CIPRO, CODEINE, MACRODANTIN,  OXYCODONE, SULFA, ADHESIVE, and LATEX.   SOCIAL HISTORY:  She currently lives at home, cared by family.  No  tobacco, alcohol, or drug use.   FAMILY HISTORY:  Noncontributory.   PHYSICAL EXAMINATION:  VITALS ON ADMISSION:  O2 sat 96% on 4 liters.  Blood pressure initially 68/48, up to 145/62 on Levophed, now down to  80/50.  Respirations 26, now down to 18.  Heart rate 88.  GENERAL:  She is somewhat  drowsy.  Briskly alert and oriented x1.  HEENT:  Normocephalic and atraumatic.  Mucous membranes are dry.  She has no carotid bruits.  HEART:  Regular rate and rhythm.  S1 and S2.  LUNGS:  Decreased breath sounds secondary to body habitus.  ABDOMEN:  Soft, obese, nontender.  Positive bowel sounds.  EXTREMITIES:  No clubbing or cyanosis.  She has about 1+ pitting edema  from the knees down.   LAB WORK:  Sodium 132, potassium 3.8, chloride 107, bicarb 17.  She has  no elevated anion gap.  BUN 64, creatinine 1.97, glucose 146.  White  count 19.2, H&H 11.6 and 35, MCV 91, platelet count 192.  UA notes 100  of glucose, large hemoglobin, small bilirubin, greater than 300 protein,  moderate leukocyte esterase with too-numerous-to-count red cells, 21-50  white cells, many bacteria, and few epithelial.  Reportedly at the  urologist's, her sensitivities were sensitive to tobramycin.   ASSESSMENT/PLAN:  1. Urinary tract infection with early signs of sepsis, intravenous      fluids, intravenous antibiotics.  No signs of vancomycin-resistant      enterococci yet.  Placement to step-down.  2. Acute renal failure secondary to #1:  Hydrate.  3. Parkinson's disease:  Sinemet, if she is able to take it.  4. Diabetes mellitus:  Sliding scale insulin.      Hollice Espy, M.D.  Electronically Signed     SKK/MEDQ  D:  05/08/2008  T:  05/08/2008  Job:  [id number]   cc:   Evie Lacks, MD  Lucrezia Starch. Earlene Plater, M.D.  Dario Guardian, M.D.

## 2011-03-18 NOTE — H&P (Signed)
NAMEALVIRA, [name]                 ACCOUNT NO.:  [id number]   MEDICAL RECORD NO.:  [id number]          PATIENT TYPE:  INP   LOCATION:  0102                         [facility]ACILITY:  [facility]   PHYSICIAN:  Donalynn Furlong, MD      DATE OF BIRTH:  [birth year]   DATE OF ADMISSION:  06/24/2009  DATE OF DISCHARGE:                              HISTORY & PHYSICAL   PRIMARY CARE PHYSICIAN:  Dario Guardian, M.D.   CHIEF COMPLAINT:  Fever, chills, abdominal pain, diarrhea.   HISTORY OF PRESENT ILLNESS:  [name] is a 77 year old Caucasian  female who lives with her husband.  She presented to [facility]  Emergency Department today after having a 3-day episode of diarrhea.  Initially she had diarrhea x 15 times on the first day, then 2 to 3  bowel movements on the second and none today.  She also has some  weakness, and her blood pressure was found to be on the lower side at  home.  She also complained of fever up to 100.8 Fahrenheit associated  with the chills.  She has cramping type of abdominal pain in the lower  abdomen on the lower left side and right side, which is improving now.  She denies any pain at the time of encounter.  The patient mentions that  she also noticed some blood yesterday in the stool, otherwise she has  been stable.  She denies any cough or sputum production, chest pain.  She does have some shortness of breath due to dehydration and weakness.  The patient denies any urinary complaint.  She has chronic left more  than right swelling due to chronic phlebitis in both lower extremities.   PAST MEDICAL HISTORY:  Hypertension, arthritis, chronic back pain,  bladder cancer, fibromyalgia, myocardial infarction, osteoporosis,  Parkinson's disease, urinary tract infection, curvature of spine.   PAST SURGICAL HISTORY:  Bladder tacking, hysterectomy, and as per past  medical history.   FAMILY HISTORY:  Nothing remarkable.   SOCIAL HISTORY:  The patient denies any alcohol, drug,  tobacco use.  Lives with her husband.   DRUG ALLERGIES:  OXYCODONE, SULFA.  The patient denies any other drug  allergies at this time, even though she has listed many drug allergies.   PHYSICAL EXAMINATION:  VITAL SIGNS:  Blood pressure 82/52, pulse 68,  respirations 22, temperature 97.5, oxygen saturation 96% on room air.  GENERAL:  Alert, oriented x3.  Laying in bed without any acute distress.  CARDIOVASCULAR:  S1 and S2.  Regular.  No murmur, rub, gallop.  LUNGS:  Clear to auscultation bilaterally.  No wheezing, rhonchi, or  crackles.  ABDOMEN:  Nontender in the upper part.  There is tenderness in the lower  part of the abdomen, nondistended.  Bowel sounds minimal.  No deep organ  enlargement.  No guarding, rigidity or extra tenderness.  No CVA  tenderness.  EXTREMITIES:  Swelling of both lower extremities, left more than right.  Redness over the lower part of both shins noted with upper extremity  with good pulses.  No clubbing or cyanosis noted.  HEAD:  Normocephalic nontraumatic.  EYES:  Pupils react to light and accommodation.  Extraocular muscles  intact.  Oral cavity mucosa dry.  No thrush noted.  NECK:  No thyromegaly or JVD.  SKIN:  No rash or bruises.  NEUROLOGICAL:  Shows intracranial muscular strength, sensation, and  reflexes.   REVIEW OF SYSTEMS:  Positive as per HPI, otherwise negative review of  systems done for 14 system.   Lab work:  Urinalysis shows WBCs 21-50, few bacteria.  Lipase 11.  Chronic comprehensive metabolic panel unremarkable except sodium 131,  glucose 108, BUN 64, creatinine 2.45, albumin 3, SGOT 56, GFR 19.  WBC  10.6, hemoglobin 10.3, platelets 209.   Abdominal x-ray shows chronic wall thickness suspicious for colitis but  no evidence of bowel obstruction.  No pneumoperitoneum or bibasilar  atelectasis.   ASSESSMENT AND PLAN:  1. Abdominal pain, diarrhea, fever, chills:  Abdominal x-rays show      history of colitis in patient with history  of recent egg ingestion,      suggestive of acute gastroenteritis with colitis.  2. Leukocytosis.  3. Acute renal insufficiency, most likely due to dehydration.  4. Hyponatremia.  5. History of gastroesophageal reflux disease.  6. Hypertension.  7. Osteoarthritis.  8. Chronic back pain.  9. Bladder cancer.  10.Fibromyalgia.  11.Coronary artery disease.  12.Myocardial infarction.  13.Osteoporosis.  14.Parkinson's disease.  15.History of urinary tract infection.  16.History of curvature of the spine.  17.History of congestive heart failure.  18.History of bladder tacking.  19.History of hysterectomy.   PLAN:  Will admit the patient on telemetry bed under [facility] team with  the diagnoses of colitis, leukocytosis, acute renal insufficiency,  hyponatremia.  Will check CBC, CMP, urine lytes, urine creatinine.  Will  check renal ultrasound.  Will check stool for culture, ova and  parasites, C. Diff toxin.  Will get abdominal x-ray, 2 view, in the  morning.  Will get CT abdomen/pelvis with oral contrast now.  Will put  her on bedrest.  Will check vital signs q.4h.  Will keep her n.p.o.  except medicine, ice chips and water, normal saline for hydration to  improve her creatinine.  Will provide IV Nexium SCDs on the legs for GI  and DVT prophylaxis.  Will provide IV Zofran and Phenergan,  p.o. Tylenol, Ambien 81 p.r.n. for symptomatic control.  Further plan  according to workup pending.  She will continue to hold her own  medication at this time.  The patient is full code.  CT abdomen/pelvis  has been ordered and results pending.      Donalynn Furlong, MD  Electronically Signed     TVP/MEDQ  D:  06/24/2009  T:  06/24/2009  Job:  [id number]   cc:   Dario Guardian, M.D.  Fax: [phone]

## 2011-03-18 NOTE — H&P (Signed)
NAMEALIXIS, [name]                 ACCOUNT NO.:  [id number]   MEDICAL RECORD NO.:  [id number]          PATIENT TYPE:  INP   LOCATION:                               FACILITY:  [facility]   PHYSICIAN:  Michiel Cowboy, MDDATE OF BIRTH:  [birth year]   DATE OF ADMISSION:  02/21/2008  DATE OF DISCHARGE:                              HISTORY & PHYSICAL   ADDENDUM:  Please add to the work number [id number]. Please add medications:  1. Mirapex 1.5 mg p.o. daily.   1. Plavix 75 mg p.o. daily.  2. Lisinopril 5 mg p.o. daily, currently held.  3. L-thyroxine.  4. Iron.  5. Ocuvite.  6. B12.  7. Prilosec 20 mg p.o. daily.  8. Alprazolam 0.5 mg p.o. every night.  9. Metformin 500 mg p.o. daily.  10.Simvastatin 40 mg p.o. once a day.   THE PATIENT IS ALLERGIC TO OXYCONTIN, CEPHALEXIN, NITROFURANTOIN,  CIPROFLOXACIN, AND BACTRIM.      Michiel Cowboy, MD  Electronically Signed     AVD/MEDQ  D:  02/21/2008  T:  02/21/2008  Job:  [id number]

## 2011-03-18 NOTE — H&P (Signed)
NAMEJANESA, [name]                 ACCOUNT NO.:  [id number]   MEDICAL RECORD NO.:  [id number]          PATIENT TYPE:  INP   LOCATION:                               [facility]ACILITY:  [facility]   PHYSICIAN:  Michiel Cowboy, MDDATE OF BIRTH:  January 15, 1928   DATE OF ADMISSION:  02/21/2008  DATE OF DISCHARGE:                              HISTORY & PHYSICAL   PRIMARY CARE PHYSICIAN:  Dario Guardian, M.D.   CHIEF COMPLAINT:  Weakness.   HISTORY OF PRESENT ILLNESS:  The patient is a 77 year old female with  history of fibromyalgia and Parkinson's disease who, for the past few  weeks, has been progressively feeling more and more weak with worsening  difficulty walking.  She was seen by her neurologist as well as by her  primary care physician who felt she had a UTI and started her on  nitrofurantoin as well as Macrobid, and urologist felt that her weakness  overall was secondary to urinary tract infection.  The patient  progressed and then started to feel worse.  She presented to the  emergency department where she was found to be hypotensive down to  70s/40s.  The patient was given 4 units of IV fluids and transiently  started on dopamine after which blood pressure went to 190s/100, and her  heart rate went up to the 140s.  Dopamine was stopped.  At the time of  initial evaluation by [facility], blood pressure was up to 140s  but then transiently went down again to 70s/60s, and has been basically  up and down throughout her visit here in the ED.   PAST MEDICAL HISTORY:  Significant for:  1. Coronary artery disease.  2. Hypertension.  3. Dyslipidemia.  4. Parkinson's disease.  5. Fibromyalgia.  6. Barrett's esophagus.   SOCIAL HISTORY:  The patient currently lives at home with her husband.  Does not work, does not smoke or drink.   FAMILY HISTORY:  Noncontributory.   REVIEW OF SYSTEMS:  Unable to obtain complete Review of Systems but  otherwise negative except for as in HPI.   Negative for chills or fevers.  Positive for difficulty walking and constipation.  No chest pain, no  shortness of breath.  The patient is lethargic.   PHYSICAL EXAMINATION:  VITAL SIGNS:  Heart rate up to 140s, blood  pressure 70s/40s and up to 149/73.  Respirations 18.  Temperature 97.2.  GENERAL:  The patient appears to be in no acute distress.  HEART:  Rapid and somewhat irregular.  No murmurs, rubs, or gallops.  LUNGS:  Clear to auscultation anteriorly.  EXTREMITIES:  Lower extremities without edema.  ABDOMEN:  Soft, nontender, nondistended.   LABORATORY DATA:  White blood cell count 12.3, hemoglobin 11.1,  platelets 281.  Sodium 130, potassium 3.7, creatinine 3.21, AST slightly  elevated at 62, ALT 53.  UA:  Rare bacteria, 3-6 white blood cells,  leukocyte esterase positive.   Chest x-ray shows nodule which are new since CT scan 2 months ago.   EKG shows heart rate of 120s, irregularly irregular, occasional P waves  noted,  but majority of QRS did not have P waves.  Unclear but suspect  possible atrial fibrillation.  Will defer to cardiology for further  evaluation.   ASSESSMENT AND PLAN:  This is a 77 year old female with history of  Parkinson's disease and fibromyalgia with frequently fluctuating blood  pressure.  Etiology unclear, could be sepsis versus cardiac, although  less likely.  The patient responded to dopamine with heart rate up to  160s and blood pressure up to 190s.  Per further discussion with family,  per patient and family, they do not wish patient to be resuscitated or  to be on any pressors for a prolonged period of time.   Will treat for presumptive sepsis with antibiotics and IV fluids.  We  will admit to step-down.  Hold off on pressors for right now and see how  patient does.  If her blood pressure improves again, resolves  requirement for pressors.  Will discuss with family options of using  pressors alone instead of for resuscitation if necessary.   Will order  blood cultures and KUB.   For pulmonary nodules, will repeat chest x-ray.  May need followup CT of  chest in the next few months to see if there is progression.   Elevated liver function tests could be secondary to shock liver but will  get ultrasound.   For Parkinson's, continue home medications.   For coronary artery disease, continue aspirin and Plavix.  Would  recommend cardiology consult in the morning given transient hypotension  to see if there is a cardiogenic etiology to this.   For tachycardia, will repeat EKG.  Once in step-down, avoid metoprolol  for now since blood pressure is down.  Will discuss with cardiology in  the morning.  The patient did not want to have any defibrillation or  cardioversion done.  Will attempt to support with fluids and start beta  blocker if possible if blood pressure tolerates.   Prophylaxis with Protonix and Lovenox.   Dependency.  Will have PT and OT consult.  The patient may need to be  placed.   CODE STATUS:  The patient is limited code at this point, still  discussing if it is okay to do transient pressors,  but patient does not  wish to have a central line placed and does not wish to be intubated,  does not wish chest compression or defibrillation or cardioversion.      Michiel Cowboy, MD  Electronically Signed     AVD/MEDQ  D:  02/21/2008  T:  02/21/2008  Job:  [id number]   cc:   Dario Guardian, M.D.  Fax: [phone]

## 2011-03-18 NOTE — H&P (Signed)
NAMEALEXIE, LANNI NO.:  [id number]   MEDICAL RECORD NO.:  [id number]           PATIENT TYPE:   LOCATION:                                 FACILITY:   PHYSICIAN:  Gardiner Barefoot, MD    DATE OF BIRTH:  [birth year]   DATE OF ADMISSION:  DATE OF DISCHARGE:                              HISTORY & PHYSICAL   PRIMARY CARE PHYSICIAN:  Dario Guardian, M.D.   CHIEF COMPLAINT:  Syncope.   HISTORY OF PRESENT ILLNESS:  This is a 77 year old female with  Parkinson's disease and recently discharged from the hospital with  sepsis syndrome, who presents here with syncope episode that lasted  about 2 minutes.  The patient's husband reports that she was found  unresponsive for an episode that lasted about 2 minutes, and when the  EMS arrived, they found the patient notably hypotensive.  The patient,  otherwise, reports no recent fevers or other significant illnesses since  her hospitalization several days ago.  The patient never had an episode  like this in the past.  Of note, the only medication change in the  recent past has been her Eldepryl, which was increased during in her  hospitalization.   PAST MEDICAL HISTORY:  1. Parkinson's.  2. Malnutrition.  3. Diabetes.  4. CAD.  5. Hypotension.  6. Fibromyalgia.  7. Barrett esophagus.   MEDICATIONS:  Previous discharge include:  1. Xanax 0.5 q.h.s.  2. Neurontin 100 mg p.o. t.i.d.  3. Fosinopril 5 mg daily.  4. Prilosec 20 mg.  5. Plavix 75 mg.  6. Aspirin 81 mg daily.  7. Simvastatin 40 mg daily.  8. Mirapex 1 mg t.i.d.  9. Megace 40 mg daily.  10.Eldepryl 5 mg twice a day, which was increased.  11.Sinemet 1/2 tablet 25/100 p.o. t.i.d.  12.Albuterol inhaler 2 puffs p.r.n.   ALLERGIES:  Includes:  1. CODEINE.  2. CIPRO.  3. NITROFURANTOIN.  4. OXYCODONE.  5. TMP-SMZ.  6. LATEX.  7. ADHESIVE.   SOCIAL HISTORY:  Otherwise, her husband denies any alcohol, tobacco, or  drugs.   FAMILY HISTORY:   Noncontributory.   REVIEW OF SYSTEMS:  Negative except as per the history of present  illness.   PHYSICAL EXAMINATION:  VITAL SIGNS:  Temperature is 96.6, pulse is 63,  and blood pressure is 76/47, which increased to 114 systolic.  GENERAL:  The patient is awake, alert, oriented x3, and appears in no  acute distress.  CARDIOVASCULAR:  Bradycardia with regular rhythm.  No murmurs, rubs, or  gallops.  LUNGS:  Clear to auscultation bilaterally.  ABDOMEN:  Soft, nontender, and nondistended.  Positive bowel sounds and  no hepatosplenomegaly.  EXTREMITIES:  Without edema.   LABORATORY DATA:  WBC of 9.4, hemoglobin 10, and platelet 375.  Sodium  139, potassium 4.0, and glucose 120.  Creatinine 1.9 and BUN 43.  Chest x-ray, no acute disease.   ASSESSMENT AND PLAN:  1. Syncope.  We will admit the patient to assure that she has not had      myocardial infraction.  However, less likely,  this represents      medication-induced effect, but she has had increased in her      medication including the Eldepryl and was I believe started on      Mirapex during her last hospitalization.  We will consider      discussion with neurology regarding her medications with these non-      side effects particularly Eldepryl if this may be the cause.  We      will assure that her cardiac enzymes remain negative.  She,      otherwise, remained stable on the monitor.  We also hydrate the      patient as she does report decreased p.o. in the recent past..  2. Coronary artery disease.  We will continue with the patient  on      Plavix and aspirin as well as simvastatin.  3. Parkinson's.  We will hold her Parkinson's medications at this      time, pending review by neurology and rest of her workup.  Also, we      will hold her Xanax and other sedating medications while she is      monitored.      Gardiner Barefoot, MD  Electronically Signed     RWC/MEDQ  D:  03/04/2008  T:  03/05/2008  Job:  [id number]

## 2011-03-18 NOTE — Discharge Summary (Signed)
[name], [name]                 ACCOUNT NO.:  [id number]   MEDICAL RECORD NO.:  [id number]          PATIENT TYPE:  INP   LOCATION:  3710                         FACILITY:  [facility]   PHYSICIAN:  Kela Millin, M.D.DATE OF BIRTH:  May 15, 1928   DATE OF ADMISSION:  05/08/2008  DATE OF DISCHARGE:  05/18/2008                               DISCHARGE SUMMARY   DISCHARGE DIAGNOSES:  1. E-coli urinary tract infection.  2. E-coli sepsis/shock - secondary to #1.  3. Paroxysmal atrial fibrillation, new onset  4. Congestive heart failure, systolic - EF 40-45%.  5. Sacral insufficiency fracture.  6. Degenerative disk disease with radiculopathy.  7. Diabetes mellitus, type 2.  8. History of coronary artery disease.  9. History of severe scoliosis of lumbar spine.  10.Parkinson's disease.  11.History of adrenal insufficiency.  12.GERD.  13.Fibromyalgia.  14.History of Barrettes esophagus.  15.History of Raynaud's syndrome.  16.Acute renal failure, resolved.   PROCEDURES/STUDIES:  1. Left internal jugular central venous line on May 09, 2008, by Dr.      Marchelle [name] on May 09, 2008.  2. A 2-D echo on May 11, 2008 - overall left ventricular systolic      function moderately decreased.  EF 40-45%.  There was akinesis of      the entire inferior posterior/inferior apical wall.  The left      ventricular wall thickness was mildly increased.  Doppler      parameters consistent with elevated mean left arterial filling      pressure.  The inferior vena cava mildly dilated.  3. Renal ultrasound - normal appearance of the kidneys.  4. MRI of the lumbar spine without contrast - sacral insufficiency      fracture which appears acute/subacute.  Scoliosis and multilevel      disk degeneration.   CONSULTATIONS:  1. Infectious disease.  2. Critical care - Dr. Marchelle [name].  3. Cardiology - Dr. Verdis [name].   BRIEF HISTORY:  The patient is a 77 year old white female with the above-  listed medical  problems as well as a history of VRE, urinary tract  infections/urosepsis who presented with drowsiness.  It was noted that  she had been hospitalized about 6 weeks ago for urosepsis and she  followed up with her urologist the week prior to presentation.  Over the  July 4 weekend, her husband noted that she was more drowsy and was  concerned about recurrence of a urinary tract infection.  She seemed to  improve, but thereafter, again became more somnolent, and so he brought  her to the ER.  In the ER, she was found to have a BUN of 64 with a  creatinine of 1.9 - her baseline 1.2.  Her white cell count was elevated  at 19.2 with a neutrophil count of 97% and a urinalysis was consistent  with a urinary tract infection.  The patient was initially hypotensive  with her systolic blood pressure in the 60s, and after receiving a fluid  bolus, she remained hypotensive and so was started on Levophed, but  because she became very tachycardiac  with heart rate up to the 140s, she  was weaned off the Levophed and her systolic blood pressure again  dropped down to the 80s, and she was then continued on IV fluids  maintaining systolic blood pressure around 85.  The ER physician spoke  with the patient's urologist, who reviewed her lab work and indicated  that the urine that had been done about a week ago was sent for  sensitivities which was sensitive to tobramycin and the patient was  started on this while in the ER and admitted for further evaluation and  management.  Please see the full admission history and physical dictated  on May 08, 2008 by Dr. Rito [name] for the details of the admission  physical exam as well as the laboratory data.   HOSPITAL COURSE:  1. E-coli urinary tract infection and septic shock - upon admission,      the patient was started on a tobramycin as discussed above.      Infectious disease was consulted and they saw the patient and      discontinued the Tobramycin and the  patient was placed on imipenem      instead.  Urine as well as blood cultures were obtained.  As      already discussed above, she was hypotensive and received fluid      boluses and was started on Levophed, but because she became      tachycardiac, this was weaned off.  She was placed on      hydrocortisone as it was noted that she did have a history of      adrenal insufficiency and was on long-term hydrocortisone.      Critical Care was consulted and a central line was placed and they      followed the patient as well.  Subsequently, the urine and blood      cultures grew E-coli which were sensitive to Cipro.  The patient's      antibiotics were then changed to Cipro.  With the above      interventions, her blood pressures stabilized.  Her leukocytosis      resolved.  Her last white cell count prior to discharge is 8.4.      She has remained afebrile and hemodynamically stable.  She is      tolerating p.o. well and she will be discharged on oral Cipro to      complete the antibiotic course.  2. Paroxysmal atrial fibrillation - while being monitored in the      hospital, the patient developed atrial fibrillation.  Cardiac      enzymes were done and the troponins were elevated 0.10 and 0.14      with normal CKs.  A TSH was done and was within normal limits at      1.53.  Cardiology was consulted and Dr. Katrinka [name] followed the patient.      A 2-D echo cardiogram was done, and the results are as stated      above.  She was started on Lopressor, and her rate was controlled      on this.  Subsequently, the patient spontaneously converted to      normal sinus rhythm and has remained in normal sinus rhythm up to      the time of discharge today.  Dr. Michaelle [name] impression was that the      atrial fibrillation was likely precipitated by the stress of      illness and so he  stated that no chronic Coumadin is recommended      unless she spontaneously develops atrial fibrillation without      stress  cost.  3. Congestive heart failure - the patient developed dyspnea and the      BNP was done which was elevated.  She was diuresed with IV Lasix.      Cardiac enzymes were done as well as a 2-D echo and the results are      as stated above.  Following diuresis with Lasix, the patient's      symptoms resolved and Cardiology recommended that she be maintained      on hydrochlorothiazide upon discharge.  Her CHF is compensated at      this time.  4. Sacral insufficiency fracture - while in the hospital, the patient      was complaining of back pain and an MRI was done and the results as      stated above.  [facility], Dr. Thomasena Edis, saw the patient      and they recommended bed rest/wheelchair and pain management.  The      patient is to follow up with orthopedics.  5. Parkinson's - the patient was maintained on outpatient medications      during her hospital stay.  6. History of adrenal insufficiency - she was maintained on her      hydrocortisone during her hospital stay.  7. Diabetes mellitus - her Accu-Cheks were monitored and she was      covered with insulin during her hospital stay.  8. Acute renal failure - as stated above, the patient's BUN was      elevated at 64 with a creatinine of 1.9 on admission.  Renal      ultrasound was done and the results as stated above.  The      impression was that this the likely component of ATN secondary to      the severe hypotension upon admission.  The patient was hydrated      and the urinary tract infection treated as discussed above.  With      this intervention, the acute renal failure resolved.  Her BUN prior      to discharge 15 with a creatinine of 1.06.   DISCHARGE MEDICATIONS:  1. Cipro 500 mg one p.o. b.i.d. through July 24 (that is eight more      days).  2. Hydrochlorothiazide 12.5 mg p.o. daily.  3. Carbidopa/Levo 25/100 mg one p.o. t.i.d. (7:00 a.m., 12 noon and      5:00 p.m.).  4. Gabapentin 100 mg one p.o. t.i.d.   5. Hydrocortisone 5 mg p.o. q.a.m. and 2.5 mg at noon.  6. Iron sulfate 325 mg daily.  7. Mirapex 1 mg p.o. q. 7 a.m. and 0.5 mg p.o. q. 5 p.m.  8. Xanax 0.5 mg p.o. q.h.s.  9. Protonix 40 mg p.o. daily.  10.Zocor 40 mg p.o. q.h.s.  11.Selegiline 5 mg p.o. b.i.d.  12.Megace 400 mg p.o. daily.  13.Midodrine 2.5 mg p.o. q.a.m. and 5 mg p.o. q.p.m.  14.Albuterol MDI two puffs q.4 h p.r.n.  15.Beta carotene one p.o. daily.  16.Senokot one p.o. q.h.s. p.r.n.  17.Vicodin one p.o. q.4 h p.r.n.  18.Sliding scale insulin.  19.KCl 20 mEq p.o. daily.  20.The patient was previously on Plavix 75 mg and aspirin 81 mg, but      they were put on hold prior to admission per patient's report as  she was getting spinal injections.   FOLLOW-UP CARE:  1. [facility]/Dr. Thomasena Edis call [phone] for follow up      appointment.  2. Cardiology, Dr. Verdis [name] call [phone] for follow up      appointment.  3. Nursing home physician/primary care physician as scheduled.   DISCHARGE CONDITION:  Improved/stable.      Kela Millin, M.D.  Electronically Signed     ACV/MEDQ  D:  05/18/2008  T:  05/18/2008  Job:  [id number]   cc:   Dario Guardian, M.D.  Evie Lacks, MD  Dorisann Frames, M.D.  Lyn Records, M.D.

## 2011-03-18 NOTE — Discharge Summary (Signed)
[name], [name]                 ACCOUNT NO.:  [id number]   MEDICAL RECORD NO.:  [id number]          PATIENT TYPE:  INP   LOCATION:  1239                         FACILITY:  [facility]   PHYSICIAN:  Corinna L. Lendell Caprice, MDDATE OF BIRTH:  11-29-1927   DATE OF ADMISSION:  11/15/2008  DATE OF DISCHARGE:  11/16/2008                               DISCHARGE SUMMARY   DISCHARGE DIAGNOSES:  1. Resolved of vertigo.  2. Altered mental status probably secondary to medications.  3. Resolving acute renal insufficiency, most likely prerenal azotemia.      Needs outpatient followup.  4. Weight loss, follow up as an outpatient.  5. Parkinson's disease.  6. Polypharmacy.  7. History of bladder cancer.  8. Fibromyalgia.  9. Hypertension.  10.History of paroxysmal atrial fibrillation.  11.Recurrent urinary tract infections.  12.Congestive heart failure with ejection fraction of 40-45%.  13.Type 2 diabetes, diet control.  14.History of coronary artery disease with stent placement.  15.Adrenal insufficiency.  16.Gastroesophageal reflux disease.  17.History of Barrett's esophagus.  18.Raynaud's syndrome.   DISCHARGE MEDICATIONS:  Meclizine 25 mg every 6 hours as needed for  vertigo.  I recommend tapering down or off the Xanax, hydrocodone,  Skelaxin as tolerated.  Otherwise medications remain the same.  Please  see H and P and medicine reconciliation form for details.   LABS AND OTHER STUDIES:  Carotid Dopplers showed antegrade vertebral  artery flow.  No significant carotid artery stenosis.  Chest x-ray on  admission showed cardiomegaly, nothing acute.  CT brain on admission  showed nothing acute.  CT C-spine showed diffuse degenerative disk  disease.  MRI of the brain showed no acute intracranial abnormality,  acute and chronic sinusitis.  MRA of the brain is negative.  Renal  ultrasound negative.   HISTORY AND HOSPITAL COURSE:  Ms. Karam is a 77 year old white female  patient of Dr. Katrinka Blazing who  presented with altered mental status and  vertigo.  Please see H and P for details.  She is on multiple sedating  medications.  She was hypothermic on admission according to H and P with  a temperature of 95.9.  I am not sure how this temperature was taken.  Blood pressure 114/63, pulse 67, respiratory rate 22, oxygen saturation  97% on room air.  She was sleepy, but arousable.  Slightly dry mucous  membranes.  She had a nonfocal neurologic  examination.  She was admitted for further workup.  Her vertigo and  altered mental status resolved and she was requesting to go home.  She  had stable vital signs and was ambulating and tolerating a diet at the  time of discharge.  She had unremarkable labs and is stable to go home.      Corinna L. Lendell Caprice, MD  Electronically Signed     CLS/MEDQ  D:  12/14/2008  T:  12/14/2008  Job:  [id number]

## 2011-03-18 NOTE — Op Note (Signed)
[name], [name]                 ACCOUNT NO.:  [id number]   MEDICAL RECORD NO.:  [id number]          PATIENT TYPE:  AMB   LOCATION:  DAY                          FACILITY:  [facility]   PHYSICIAN:  Martina Sinner, MD DATE OF BIRTH:  [birth year]   DATE OF PROCEDURE:  04/23/2009  DATE OF DISCHARGE:                               OPERATIVE REPORT   PREOPERATIVE DIAGNOSIS:  Stress incontinence.   POSTOPERATIVE DIAGNOSIS:  Stress incontinence.   OPERATION/PROCEDURE:  1. Cystoscopy.  2. Transurethral collagen injection therapy.   INDICATIONS:  [name] has stress incontinence.  She has a lot of  urge symptoms __________ point pressure on urodynamics.  She has failed  __________treatments.   DESCRIPTION OF PROCEDURE:  The patient was prepped and draped in the  usual fashion.  The [facility] injection scope was utilized.  Bladder mucosa  and trigone were normal.  Clinically she did not have a urinary tract  infection.  With the bladder quite full, her urethra almost looked like  a stove pipe urethra, though it did coapt better at lower volumes.  I  injected three syringes of collagen at 5 and 7 o'clock.  The second  injection at 5 o'clock coapted the urethra beautifully.  I did a third  injection at 7 o'clock though things were closed down quite well with  the second injection.  Bladder was emptied through the red rubber  catheter.  The patient was given preoperative antibiotics day.  The  patient was sent to recovery room and followed as per protocol.           ______________________________  Martina Sinner, MD  Electronically Signed     SAM/MEDQ  D:  04/23/2009  T:  04/23/2009  Job:  [id number]

## 2011-03-18 NOTE — Consult Note (Signed)
[name], [name] NO.:  [id number]   MEDICAL RECORD NO.:  [id number]          PATIENT TYPE:  INP   LOCATION:  3710                         FACILITY:  [facility]   PHYSICIAN:  Lyn Records, M.D.   DATE OF BIRTH:  [birth year]   DATE OF CONSULTATION:  05/10/2008  DATE OF DISCHARGE:                                 CONSULTATION   CONCLUSIONS:  1. Paroxysmal atrial fibrillation with rapid ventricular response.  2. Urosepsis with gram-negative rod bacteremia.  3. Diabetes mellitus.  4. Hypertension.  5. History of parkinsonism.  6. Renal insufficiency.  7. Fibromyalgia.  8. Coronary atherosclerotic heart disease.  9. Barrett esophagus.  10.Gastroesophageal reflux disease.   RECOMMENDATIONS:  1. Metoprolol 5 mg IV and repeat in 10 minutes.  2. Metoprolol 25 mg p.o. q.8 h. as tolerated by heart rate and blood      pressure, keeping the heart rate greater than 55 and the systolic      pressure greater than 100.  3. Transthoracic echocardiogram to assess LV size and function and      valvular function.  Rule out vegetation and pericardial disease in      this patient with urosepsis.  4. Check BNP and cardiac ischemic markers given the patient's history      of coronary atherosclerosis.  5. Serial EKGs.   COMMENT:  The patient has had recurring hospital admissions for urinary  tract infections and urosepsis.  She is 77 years of age and has a  history of coronary artery disease, having previously undergone PTCA and  stenting of the circumflex in 2004, and the right coronary in 2005.  The  right coronary PCI was performed with a drug-eluting stent.  The patient  has residual known coronary disease with an 80% OM1 50% LAD, 50% mid to  distal right coronary.   The patient is short of breath, has had intermittent brief episodes of  shortness of breath over the past 6 months.  When she was noted to be in  atrial fibrillation today, she says only thing she felt was  shortness of  breath.  She did not have chest discomfort or palpitations.   Medications at the time of this consultation include Primaxin,  midodrine, Protonix, hydrocortisone, Lovenox, Xanax, and IV antibiotics  for urosepsis.   ALLERGIES:  CODEINE, CIPRO, NITROFURANTOIN, OXYCODONE, BACTRIM,  ADHESIVE, LATEX, PENICILLIN, and SULFA.   FAMILY HISTORY:  Positive for CAD.   SOCIAL HISTORY:  Negative for illicit drugs.  Does not drink alcohol.  Denies tobacco.   PHYSICAL EXAMINATION:  GENERAL:  The patient appears somewhat short of  breath.  VITAL SIGNS:  Her blood pressure is 130/60, heart rate initially 110  subsequently in the 80s, but varying with intermittent atrial  fibrillation noted on monitor.  HEENT:  Reveals pupils that are equal and reactive.  The patient's neck  is stubby and fat.  We are unable to fully assess the neck veins.  CHEST:  Clear.  CARDIAC:  No murmur.  No rub.  ABDOMEN:  Soft.  EXTREMITIES:  No edema.  Laboratory data reveals creatinine of 1.05, BUN is 38, potassium is 3.8,  TSH is 1.49.  Blood culture has been positive for E. coli.  Chest x-ray  reveals an enlarged heart size with evidence of a left central venous  line in place.  EKG demonstrates AFib with a rapid ventricular response,  but a subsequent EKG done at 5:25 demonstrates normal sinus rhythm,  atrial abnormality, no acute ST-T wave change.  Cardiac markers are  significant for BNP of 987.  Hemoglobin of 10.2, white blood cell count  of 21,900.  Troponin I is 0.1, CK-MB 4.3.   DISCUSSION:  Overall, the patient is ill.  Atrial fibrillation is likely  secondary to her intercurrent illness.  Given gram-negative sepsis, we  need to rule out endocarditis.  The patient is also in a degree of heart  failure currently and need some IV Lasix.  An echocardiogram will be  done to assess for valvular vegetations and pericardial effusion.      Lyn Records, M.D.  Electronically Signed      HWS/MEDQ  D:  05/10/2008  T:  05/11/2008  Job:  [id number]   cc:   Ramiro Harvest, MD  Dario Guardian, M.D.

## 2011-03-18 NOTE — Discharge Summary (Signed)
[name], [name]                 ACCOUNT NO.:  [id number]   MEDICAL RECORD NO.:  [id number]          PATIENT TYPE:  INP   LOCATION:  6741                         FACILITY:  [facility]   PHYSICIAN:  Michiel Cowboy, MDDATE OF BIRTH:  [birth year]   DATE OF ADMISSION:  03/04/2008  DATE OF DISCHARGE:  03/14/2008                               DISCHARGE SUMMARY   CONSULTATIONS:  Evie Lacks, M.D.   DISCHARGE DIAGNOSES:  1. Adrenal insufficiency.  2. Parkinson's disease.  3. Liable blood pressure.  4. Diabetes.  5. History of coronary artery disease.  6. Hypertension.  7. Fibromyalgia.  8. Barrett's esophagus.  9. Dehydration.  10.Urinary tract infection, grew VRE.  11.Raynaud's syndrome.   STUDIES:  Chest x-ray done Mar 04, 2008 showing no active cardiopulmonary  disease. CT scan of the abdomen done on Mar 09, 2008 showing no adrenal  masses but extensive atherosclerotic disease.  MRI of the brain done on  Mar 11, 2008 showing no pituitary abnormality.  Minimal small vessel  disease. No sign of irreversible process.   LABORATORY DATA:  At the time of discharge, creatinine 1.32, potassium  4.6, sodium 141, potassium 9.8.  The patient was noted to have a  cortisone level 1.7, increasing after 30 minutes to 13.2, and after 60  minutes to 16 after stimulation test.  ACTH level was noted to be less  than 5 showing resistant VRE (Enterococcus resistant to vancomycin).   HOSPITAL COURSE:  1. The patient is a 77 year old female with history of Parkinson's      disease, recent admission for hypertension, originally thought to      be secondary to sepsis. Presented again with syncopal-like event,      noted to be severely orthostatic.  Further work-up showed cortisone      level down to 1.7.  A cosyntropin stimulation test was performed      and was significant for adrenal insufficiency.  The patient had      ACTH checked that was low  which is consistent with central      (pituitary)   adrenal insufficiency at which point the patient had a      CT scan of her abdomen and MRI of her head which did not show any      pituitary lesions or adrenal abnormalities.  The patient was      started on prednisone 5 mg in the morning and 2.5 in the afternoon      as well as required midodrine to support her blood pressure.  We      stopped all her blood pressure medications secondary to severe      hypotension when the patient was standing up.  This improved her      symptomatically.  The patient continues to be technically      orthostatic but no longer feels lightheaded when she stands up.      Will continue to discharge her on midodrine and prednisone and have      her follow up with Dr. Talmage Nap in three to four weeks.  I have  discussed this case with Dr. Talmage Nap who is expecting the patient.      The patient to schedule the appointment.  2. History of Parkinson's disease.  Could be also contributing to the      orthostasis.  Dr. Sandria Manly was consulted.  The patient had an episode      of hallucinations which was also attributed to her medications for      Parkinson's disease.  Dr. Sandria Manly has adjusted her medication regimen      at the time of discharge.  The patient is asymptomatic.  Discharged      on Sinemet, selegiline and Mirapex according to schedule as      prescribed by Dr. Sandria Manly.  The patient is to follow up with Dr. Sandria Manly      in two weeks.  3. Urinary tract infection.  The patient has noted a UTI.  Urine      culture sent and grew VRE.  The patient was started on Linezolid      and needs to finish a two-week course.  4. Anxiety.  Continue Xanax as needed.  5. History of Barrett's esophagus.  Continue Protonix 40 mg p.o.      b.i.d.  6. History of iron-deficiency.  Continue iron.  7. History of coronary artery disease.  Continue Plavix and aspirin.      Currently off beta blockers.  8. History of Hyperlipidemia  Continue Zocor.  9. Raynaud's syndrome.  Etiology at this point  unclear.  The patient      will have further outpatient work-up for autoimmune disorders which      deferred to Dr. Katrinka Blazing as per discussion with family and patient.      Instructed the patient not to touch cold things if it can be      avoided, warm up hands in warm water and call M.D. if she has pain.      Will follow up with Dr. Sandria Manly.  10.Diabetes.  Mild.  Diet controlled.  The patient is to have her      blood sugars checked q.a.m.   DISCHARGE MEDICATIONS:  1.Xanax 0.5mg  po qhs  1. Aspirin 81mg  po qd  2. Sinemet 25/100 mg at 7:00 a.m., noon and 5:00 p.m.  3. Ferrous sulfate 325 mg p.o. daily.  4. Neurontin 100 mg p.o. t.i.d.  5. Linezolid 600 mg p.o. b.i.d. until May 20 the patient is to have 8      days.  6. Megace 400 mg p.o. daily.  7. Plavix 75mg  po qd  8. Protonix 40mg  Po q day  9. Selegiline 5 mg at 7 am and noon  10.Mirapex 1mg  po at 7 am and non and 0.5mg  at 5 pm  11.Betacarotene one tablet p.o. daily.  12.Prednisone 5 mg p.o. q.a.m. and 2.5 mg p.o. at noon.  14 Midodrin 2.5mg  at 7 am noon and 5 pm  1. Zocor 40mg  po qd  2. Ventolyn 5mg  INH q 4h PRN   FOLLOW UP:  1. The patient to follow up with Dr. Talmage Nap in three to four weeks.  2. The patient to follow up with Dr. Sandria Manly in two weeks.  3. The patient the follow up with Dr. Katrinka Blazing next week.   The patient is DNR/DNI.  Concerning physical therapy, the patient is to  have assisted supervision when trying to stand up. The patient to wear  compression stockings as soon as she wakes up and take them off before  she goes to  bed.  The patient to have assistance with that daily.      Michiel Cowboy, MD  Electronically Signed     AVD/MEDQ  D:  03/14/2008  T:  03/14/2008  Job:  [id number]   cc:   Dorisann Frames, M.D.  Evie Lacks, MD

## 2011-03-21 NOTE — Discharge Summary (Signed)
[name], [name]                           ACCOUNT NO.:  [id number]   MEDICAL RECORD NO.:  [id number]                   PATIENT TYPE:  INP   LOCATION:  2009                                 FACILITY:  [facility]   PHYSICIAN:  Lyn Records, M.D.                DATE OF BIRTH:  1928-01-07   DATE OF ADMISSION:  03/08/2003  DATE OF DISCHARGE:  03/11/2003                                 DISCHARGE SUMMARY   ADMISSION DIAGNOSES:  1. Acute ST-elevation lateral myocardial infarction.  2. Fibromyalgia.  3. Parkinson's.  4. History of hypertension.  5. Remote tobacco use.  6. Intolerance to codeine causing a rash.   DISCHARGE DIAGNOSES:  1. Acute ST-elevation lateral myocardial infarction  2. ERPCI circumflex (thrombus).  3. Ejection fraction about 40%.  4. Transient bradycardia/hypotension, resolved.  5. Favorable lipid profile-low dose statin therapy.  6. Fibromyalgia.  7. Parkinson's.  8. History of hypertension.  9. Remote tobacco use.  10.      Intolerance to codeine causing a rash.   HISTORY OF PRESENT ILLNESS:  Ms. [name] is a 77 year old married white  female remote patient of Dr. Katrinka [name]. She has had chest pains in the past and  had unremarkable heart catheterization several years ago. On the morning of  admission, she was woken with severe chest pain. Presented to the emergency  room and was found to have EKG changes consistent with acute lateral  myocardial infarction. She was brought to the catheterization lab by Dr.  Elease Hashimoto (on call for Dr. Katrinka [name]) for further evaluation.   PROCEDURE:  Emergent cardiac catheterization with intervention to the  circumflex on 03/08/03 by Dr. Elease Hashimoto.   COMPLICATIONS:  None.   CONSULTATIONS:  Cardiac rehabilitation Phase I and II.   COURSE IN HOSPITAL:  Ms. [name] was admitted to [facility] on  03/08/03 with acute myocardial infarction. She was taken urgently to the  cardiac catheterization by Dr. Elease Hashimoto. Preprocedure laboratories  showed a  potassium of 4.0, BUN of 23, creatinine 0.6; LFTs within normal limits; and  a hemoglobin of 14.0. INR 1.0. In the emergency room, EKG revealed sinus  rhythm with inferolateral ST elevation of approximately 3 mm and reciprocal  T wave inversion in I, aVL, and the anterior precordial leads. Dr. Elease Hashimoto  was on call and took the patient emergently to the cardiac catheterization.  This revealed an EF of 40% with apical lateral akinesis. Left main was  smooth and normal. The LAD was small with moderate disease-40 to 50%  proximal. Diagonals were small. The circumflex was a large vessel with  proximal 20 to 30% lesion. There is a 99% subtotal occlusion after the OM1.  There was sluggish flow TIMI II. The OM1 had a 70% stenosis at its origin,  and the OM2 had diffuse distal disease. The circumflex was large and  dominant with a native 50% lesion. PDA was very  small and had 70 to 80%  proximal stenosis, and the PLA had mild irregularities. Dr. Elease Hashimoto proceeded  with intervention to the circumflex lesion. Integrilin was bolused and  infused. He got a good angiographic result despite some persistent slow flow  during the procedure. At the end of the procedure, the patient's estimated  flow was improved. A Taxus Express II Monorail 3.0 x 20 mm stent was placed.   Postprocedure, the patient had several bursts of nonsustained ventricular  tachycardia-reperfusion arrhythmias for approximately 30 beats each. She had  borderline low blood pressure in the systolics of 80s and pulse in the 60s,  occasionally dropping below 50 but asymptomatic. We were limited with  medication treatment options as a result. Potassium levels remained stable  during the entire hospital stay.   Cardiac enzyme series as follows:  CK 104, 2,305, 2,487, 2,010. MB 3.6,  270.2, 274.6, 228.8. Relative index 3.5, 11.7, 11.0, 11.4. Troponin I of  0.01, 21.18, 55.64, 44.40.   Total cholesterol was 150, triglycerides 97, HDL  56, and LDL 75.   The patient was seen by cardiac rehab beginning 03/09/03. The patient  tolerated ambulation well and had no significant increase in arrhythmia or  ectopy. The patient progressed very well and later on 03/09/03 was transferred  to a telemetry bed out of the intensive care unit.   Dr. Katrinka [name] examined the patient on 03/10/03 (Friday) and felt that the patient  was doing extremely well. She had no chest pain or dyspnea, no rub on  cardiac exam, and groin has remained stable. He felt that there was no  evidence of mechanical complication despite no reflow and marked enzyme  elevation. He recommended optimizing ACE inhibitor and beta blocker therapy  over time. Plavix and aspirin. We will scheduled her for discharge to home  Saturday morning, 03/11/03, if no problems arise. We recommend Phase II  outpatient cardiac rehab and followup.   DISCHARGE MEDICATIONS:  1. Altace 2.5 mg a day.  2. Coreg  3.125 mg b.i.d.  3. Lipitor 10 mg a day at bedtime.  4. Protonix 40 mg a day.  5. Enteric-coated aspirin 325 mg a day.  6. Plavix 75 mg a day for nine months.  7. Nitroglycerin 0.4 mg one under the tongue every five minutes as needed     for chest pain. Call 911 if no relief after three.  8. Selegiline 5 mg one in the morning and one at lunch.  9. Pergolide 0.25 mg four times a day.  10.      Stool softener choice as needed.   ACTIVITY:  No strenuous activity, lifting over five pounds, or driving for  two days. No strenuous activity until see back by Dr. Katrinka [name].   DIET:  Low fat, low cholesterol, low salt diet (4 g).   WOUND CARE:  May shower. Hydrocortisone cream to irritated areas from  electrode patches twice daily until resolved.   FOLLOW UP:  She is asked to call the office with any problems or questions.   We recommend cardiac rehabilitation Phase II outpatient program.   She will follow up with Dr. Katrinka [name] Monday 5/17 at 2:30 or sooner if any problems or questions.  Prescriptions were written for nitroglycerin, Altace,  Coreg, Lipitor, Protonix, and Plavix.     Georgiann Cocker Jernejcic, P.A.                   Lyn Records, M.D.    TCJ/MEDQ  D:  03/10/2003  T:  03/13/2003  Job:  [id number]   cc:   Tama Headings. Marina Goodell, M.D.  [address]  [place]  [place] [address]  Fax: [phone]

## 2011-03-21 NOTE — Op Note (Signed)
[facility]  Patient:    [name], [name]                        MRN: [id number] Proc. Date: 09/08/00 Adm. Date:  [id number] Disc. Date: [id number] Attending:  Nelma Rothman Iii                           Operative Report  DIAGNOSIS:  Hematuria.  OPERATIVE PROCEDURE:  Cystourethroscopy, bilateral retrograde ureteral pyelograms, right ureteroscopy with basket stone extraction.  SURGEON:  Lucrezia Starch. Ovidio Hanger, M.D.  ANESTHESIA:  General endotracheal.  ESTIMATED BLOOD LOSS:  Negligible.  TUBES:  None.  COMPLICATIONS:  None.  INDICATIONS:   [name] is a lovely 77 year old white female with a history of transitional cell carcinoma of the bladder.  She presented for a tumor check and was found to have significant microscopic hematuria.  She underwent a work-up consisting of a cystourethroscopy, which revealed a normal bladder with some bloody efflux from the right ureteral orifice.  In addition, she underwent a CT scan of the abdomen and pelvis, which revealed a 1 cm cyst of the right lobe of the liver, a tiny lower pole left renal calculus, a duplex right kidney and some mild, chronic lung changes additionally.  No urinary calculi were seen in the ureteral tubes.  She has considered all options and after discussing risks, benefits and alternatives, elected to proceed with the above procedure.  PROCEDURE IN DETAIL:  Patient was placed in supine position.  After proper general endotracheal anesthesia, was placed in the dorsal lithotomy position. Prepped and draped with Betadine in a sterile fashion.  Cystourethroscopy was performed with a 22.5-French Olympus panendoscope utilizing the 12 and 70 degree lens.  The bladder was carefully inspected.  Efflux of clear urine was noted from the left ureteral orifice and clear urine was noted to be effluxing from the right ureteral orifice.  The bladder was smooth walled and there were no lesions.  She  has a significant descensus and prolapse, but the bladder and cystocele, but bladder was otherwise without lesions.  Under fluoroscopic guidance, bilateral retrograde ureteral pyelograms were performed with an 8-French cone-tip catheter.  The left pyelocalyceal and ureteral systems appeared to be normal and evacuated readily.  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  There was a question of a filling defect distally in the ureter, but the system drained well.  A 0.038-French Teflon-coated guidewire was placed into the right ureter and up to the collecting system and a short, thin Wolf ureteroscope was used to visualize the lower ureter and a yellowish stone was noted to be present without tumor.  The stone was grasped with a Segura basket and extracted intact and submitted for stone analysis and inspection of the lower third ureter with the ureteroscope revealed that there were no other lesions.  The ______ section of the upper ureteral orifices ureters were noted at the common stem and the Y-junction was well-visualized with the ureteroscope noted to be without lesions.  The ureteroscope was visually removed, the bladder was drained, the wire was removed and the patient was taken to recovery room stable. DD:  09/08/00 TD:  09/08/00 Job: [id number]

## 2011-03-21 NOTE — Cardiovascular Report (Signed)
NAMEGERRICA, [name]                           ACCOUNT NO.:  [id number]   MEDICAL RECORD NO.:  [id number]                   PATIENT TYPE:  OBV   LOCATION:  6532                                 FACILITY:  [facility]   PHYSICIAN:  Lesleigh Noe, M.D.            DATE OF BIRTH:  September 21, 1928   DATE OF PROCEDURE:  05/22/2004  DATE OF DISCHARGE:                              CARDIAC CATHETERIZATION   INDICATION:  Unstable angina pectoris 12-48 hours.   PROCEDURE PERFORMED:  1. Left heart catheterization.  2. Selective coronary angiography.  3. Left ventriculography.  4. Stent distal right coronary, Cypher drug-eluting.  5. Angio-Seal arteriotomy closure.   DESCRIPTION:  After informed consent, a 6-French sheath was placed in the  right femoral artery using modified Seldinger technique.  A 6-French A2  multipurpose catheter was used for hemodynamic recordings, left  ventriculography by hand injection and selective left and right coronary  angiography.  The patient tolerated the procedure without complications.  We  reviewed the digital displays in the room and realized that there was a new  distal right coronary stenosis that was not present at the time of cath in  October 2004.  After discussing the situation with the patient and her  husband, we decided to proceed with intervention on the distal right  coronary which had a new greater than 90% thrombus-containing lesion.  The  stent in the previously stented circumflex was widely patent.   We gave a single bolus followed by an infusion of integrilin, 4300 units of  IV heparin and 300 mg of Plavix orally.   We used a JR-4 6-French side hole guide catheter and an [employer] medium wire.  We performed angioplasty with a 2.5 x 13-mm long Voyager balloon.  We then  deployed a 13 x 2.5 mm Cypher stent to 15 atmospheres.  Two balloon  inflations were performed.  The angiographic result was felt to be very nice  with TIMI-3 flow noted.  Angio-Seal  arteriotomy closure was performed  without complications following documentation of sheath entry site with  angiography.   RESULTS:   I. HEMODYNAMIC DATA:  A.  Aortic pressure 190/70.   II. LEFT VENTRICULOGRAPHY:  The left ventricle is by hand injection.  There  is mild inferior wall hypokinesis.  EF is 65%.  No MR.   III. CORONARY ANGIOGRAPHY:  A.  Left main coronary:  Widely patent.  B.  Left anterior descending coronary:  The LAD is relatively small.  It  reaches the  left ventricular apex.  There is 60% mid vessel narrowing.  There is distal high grade obstruction proximal to the apex.  No significant  change in anatomy is noted compared to the prior study.  C.  Circumflex artery:  The circumflex artery is large.  It gives origin to  two obtuse marginals.  The first obtuse marginal contains an ostial 85-95%  stenosis.  The stent  margin starts beyond this branch and is widely patent.  The second obtuse marginal bifurcates.  The distal more proximal obtuse  marginal branch contains a 90% stenosis before further trifurcating.  D.  Right coronary:  The right coronary was noted to have some left-to-right  collateral flow with competitive flow.  The vessel contained an ostial 60%  narrowing, mid vessel 60% narrowing and a new 85-90% distal narrowing before  two left ventricular branches.  The PDA is not visible.  PDA on previous  angiograms is relatively small and maybe the branches filling by  collaterals.   IV. PERCUTANEOUS CORONARY INTERVENTION:  Distal right coronary 90%, 0%  following Cypher drug-eluting stent, TIMI-3 flow re-established.   CONCLUSIONS:  1. Successful stent of the distal right coronary from 90% to 0%.  2. Wide patency of the stent in the circumflex.  3. Moderate ostial and mid right coronary disease as well as mid and distal     LAD disease.  There is severe disease in the second obtuse marginal     branch beyond the stented region.  4. Normal left ventricular  function.   PLAN:  Plavix x6 months.  This should be continued prior to elective  surgery.                                               Lesleigh Noe, M.D.    HWS/MEDQ  D:  05/22/2004  T:  05/23/2004  Job:  [id number]   cc:   Tasia Catchings, M.D.  [address]  [place]  [place] [address]  Fax: [phone]

## 2011-03-21 NOTE — Consult Note (Signed)
[name], [name] NO.:  [id number]   MEDICAL RECORD NO.:  [id number]          PATIENT TYPE:  INP   LOCATION:  3735                         FACILITY:  [facility]   PHYSICIAN:  Elmore Guise., M.D.DATE OF BIRTH:  1928-03-19   DATE OF CONSULTATION:  12/29/2004  DATE OF DISCHARGE:                                   CONSULTATION   REASON FOR CONSULTATION:  Increasing lower extremity edema and abnormal  troponin.   HISTORY OF PRESENT ILLNESS:  The patient is a 77 year old white female with  past medical history of coronary artery disease (status post PCI March  2004), dyslipidemia, Parkinson's disease, who presents with a two-day  history of increasing lower extremity swelling as well as facial swelling.  The patient reports a recent vaginal hysterectomy last Monday, which she  described as a prolonged surgery lasting approximately 3-1/2 to four hours.  She reports that she continued to have IV fluid until she was discharged  from the hospital.  On initial discharge she did okay; however, over the  last two days she reports approximately a five-pound weight gain as well as  mild dyspnea on exertion and orthopnea.  She reports tooth pain yesterday;  however, this was improved with her pain medication.  No significant  exertional chest pain.  She does report wheezing last evening.  Husband  reports that other than her swelling, she initially did well.   REVIEW OF SYSTEMS:  As above.   CURRENT MEDICATIONS:  Aspirin, Mirapex, Lipitor, selegiline and Xanax.   ALLERGIES:  CODEINE.   FAMILY HISTORY:  Positive for coronary disease.   SOCIAL HISTORY:  She is married.  No tobacco or alcohol.   PHYSICAL EXAMINATION:  VITAL SIGNS:  She is afebrile, heart rate is 65,  blood pressure is 109/46, saturating 96% on room air.  GENERAL:  She is a very pleasant elderly white female, alert and oriented  x4, in no acute distress.  NECK:  Supple, no lymphadenopathy, 2+ carotids,  no JVD.  CHEST:  Lungs are clear.  CARDIAC:  Heart is regular with a soft 2/6 systolic ejection murmur, no S3  or S4 noted.  ABDOMEN:  Soft, nontender, nondistended.  EXTREMITIES:  Warm with 1+ edema.   LABORATORY DATA:  White count of 6.8, hemoglobin of 8.9, platelets of 320.  PT is 13.4, INR is 1.0.  BUN and creatinine are 20 and 1.0.  Potassium is  4.2.  BNP was 262.  Troponin I was 0.11, went to 0.09, CPK was 250 to 180  range, with an MB of 5.4 and 4.5.  Point of care markers in the ER were all  negative.  Chest x-ray showed no acute cardiopulmonary disease.  EKG showed  normal sinus rhythm with old lateral wall MI, no acute ST or T-wave changes.   IMPRESSION:  1.  Volume overload, most likely secondary to volume shifts from recent      surgery.  2.  History of coronary disease.   PLAN:  1.  Agree with her current therapy.  After PE is ruled out, would  discontinue Lovenox, continue Lasix and aspirin.  2.  Will check surface electrocardiogram in the morning to evaluate both      systolic and diastolic function.  3.  Her troponins are consistent with mild volume overload state, and will      treat this aggressively with diuresis.  4.  Please call with any clinical changes.  The patient will be followed by      Lyn Records, M.D., in the morning.      TWK/MEDQ  D:  12/29/2004  T:  12/30/2004  Job:  [id number]   cc:   Lesleigh Noe, M.D.  [address]  [place]  [place] [address]  Fax: [phone]

## 2011-03-21 NOTE — H&P (Signed)
   NAMEPRESLEY, GORA                           ACCOUNT NO.:  [id number]   MEDICAL RECORD NO.:  [id number]                   PATIENT TYPE:  INP   LOCATION:  2009                                 FACILITY:  [facility]   PHYSICIAN:  Lyn Records, M.D.                DATE OF BIRTH:  [birth year]   DATE OF ADMISSION:  03/08/2003  DATE OF DISCHARGE:  03/11/2003                                HISTORY & PHYSICAL   REASON FOR ADMISSION:  The patient is a 77 year old female with a history of  chest pain in the past.  She presented to the emergency room with chest pain  and EKG changes consistent with an acute lateral myocardial infarction.  The  patient states that her chest pain had started several hours before.  It was  not relieved with nitroglycerin.  The pain was substernal in quality and  described as a deep indigestion-like pain.  It woke her up from sleep.  She  was brought to the hospital for further evaluation.   CURRENT MEDICATIONS:  Not known.   ALLERGIES:  Not known.   PAST MEDICAL HISTORY:  Coronary artery disease.   FAMILY/SOCIAL HISTORY:  Not obtained due to the urgency of the admission.   PHYSICAL EXAMINATION:  GENERAL:  She is an elderly female in moderate  distress.  Her blood pressure was 147/78 with heart rate of 60.  HEENT:  Reveals 2+ carotids, no bruit.  There is no JVD or thyromegaly.  CHEST:  Lungs are clear to auscultation.  CARDIAC:  Heart regular rate.  S1, S2.  ABDOMEN:  Good bowel sounds, soft and nontender.  EXTREMITIES:  She has no clubbing, cyanosis or edema.  NEUROLOGIC:  Nonfocal.   LABORATORY AND ACCESSORY DATA:  EKG reveals normal sinus rhythm.  She has ST  segment elevation in the lateral leads.  Laboratory data is pending.    IMPRESSION AND PLAN:  The patient presents with some clinical symptoms and  EKG findings consistent with acute anterolateral myocardial infarction.  We  will proceed directly to heart catheterization.  We have discussed the  risks, benefits and options of the procedure and she understands and agrees.     Vesta Mixer, M.D.                    Lyn Records, M.D.    PJN/MEDQ  D:  04/12/2003  T:  04/12/2003  Job:  [id number]   cc:   Lesleigh Noe, M.D.  [address]  [address]  [place]  [place] [address]  Fax: [phone]   Tama Headings. Marina Goodell, M.D.  [address]  [place]  [place] [address]  Fax: [phone]

## 2011-03-21 NOTE — Cardiovascular Report (Signed)
[name], [name]                           ACCOUNT NO.:  [id number]   MEDICAL RECORD NO.:  [id number]                   PATIENT TYPE:  INP   LOCATION:  1823                                 FACILITY:  [facility]   PHYSICIAN:  Vesta Mixer, M.D.              DATE OF BIRTH:  1927-12-06   DATE OF PROCEDURE:  03/08/2003  DATE OF DISCHARGE:                              CARDIAC CATHETERIZATION   INDICATIONS FOR PROCEDURE:  The patient is an elderly female with a history  of fibromyalgia.  She has had chest pains in the past and had an  unremarkable heart catheterization several years ago.  This morning, she  awoke with severe chest pain.  She presented to the emergency room and was  found to have EKG changes consistent with an acute lateral wall myocardial  infarction.  She was brought to the catheterization lab for further  evaluation.   PROCEDURE:  Left heart catheterization with coronary angiography.   HEMODYNAMICS:  Left ventricular pressure was 136/28 with an aortic pressure  of 133/71.   ANGIOGRAPHY:   CORONARY ANGIOGRAPHY:  1. The left main coronary artery is relatively smooth and normal.  2. The left anterior descending artery is relatively small.  There is     moderate to severe calcification in the LAD and left main.  The LAD has     moderate irregularities in the proximal segment of about 50%.  The mid     LAD has a 40% to 50% stenosis, and then the LAD tapers fairly quickly.  3. The left circumflex artery is a very large vessel.  There is a 20%     proximal stenosis.  Just after giving off a moderate-sized first marginal     branch, there is a 99.9% subtotal occlusion.  This appears to be a plaque     rupture.  There is extensive thrombus formation within this lesion.  The     remainder of the circumflex provides flow to a second obtuse marginal.     There are diffuse irregularities in the second obtuse marginal artery     with narrowings between 70% and 80%.  The flow  through this lesion is     somewhat sluggish, and it would be graded TIMI grade 2.  4. The right coronary artery is a fairly large vessel.  There are mild to     moderate irregularities in the proximal segment.  The mid RCA has a 50%     stenosis.  The distal right coronary artery has minor luminal     irregularities.  There is a 70% stenosis in the takeoff of a very small     post descending artery.  This vessel is quite small and is not a     candidate for angioplasty.   LEFT VENTRICULOGRAPHY:  The left ventriculogram was performed in a 30-degree  RAO position.  It revealed  anteroapical akinesis.  The ejection fraction is  approximately 40.   PERCUTANEOUS TRANSLUMINAL CORONARY ANGIOPLASTY:  The left main was engaged  using a 7-French Judkins left 4 guide.  The patient was given a double bolus  Integrilin drip.  She had already been given heparin in the emergency room.  Her ACT was 271.   The left circumflex was easily wired using a Trooper guidewire.  This was  followed by a 3.5 x 15-mm Quantum Maverick.  A total of 3 inflations were  performed at the circumflex lesion, 5 atmospheres for 21 seconds, 8  atmospheres for 30 seconds, and 8 atmospheres for 26 seconds.  This resulted  in significant improvement of the vessel lumen but with some sluggish flow.  Verapamil 50 mcg was given down the left circumflex artery.  This improved  the flow to some degree.  At this point, a 3.0 x 20-mm Taxus stent was  placed across the lesion.  It was deployed at 16 atmospheres for 37 seconds.  Post dilatation was achieved using the 3.5 x 15-mm Quantum Maverick.  It was  inflated up to 12 atmospheres for 27 seconds in the mid stent.  It was then  pulled proximally and was inflated up to 8 atmospheres for 25 seconds.  This  resulted in a very nice angiographic result.  The flow was still somewhat  sluggish but was noted to be gradually improving.  More and more of the  distal circumflex artery was now  able to be visualized.  The patient did  have some pain following the procedure, but the pain was gradually  improving.   COMPLICATIONS:  None.   CONCLUSIONS:  1. Successful percutaneous transluminal coronary angioplasty and stenting of     the left circumflex artery.  2. Mild to moderate left ventricular dysfunction secondary to this acute     lateral wall myocardial infarction.  3. Diffuse coronary artery disease involving the left anterior descending     artery and right coronary artery.  None of these other lesions are     amenable to angioplasty at this time.  She also has significant     calcification in the proximal vessel.   We will continue with aggressive medical therapy.                                               Vesta Mixer, M.D.    PJN/MEDQ  D:  03/08/2003  T:  03/08/2003  Job:  [id number]   cc:   Lesleigh Noe, M.D.  [address]  [address]  [place]  [place] [address]  Fax: [phone]   Tama Headings. Marina Goodell, M.D.  [address]  [place]  [place] [address]  Fax: [phone]

## 2011-03-21 NOTE — Op Note (Signed)
NAME:  [name], [name]                           ACCOUNT NO.:  [id number]   MEDICAL RECORD NO.:  [id number]                   PATIENT TYPE:  AMB   LOCATION:  ENDO                                 FACILITY:  [facility]   PHYSICIAN:  Danise Edge, M.D.                DATE OF BIRTH:  [birth year]   DATE OF PROCEDURE:  05/11/2003  DATE OF DISCHARGE:                                 OPERATIVE REPORT   INDICATIONS FOR PROCEDURE:  The patient is a 77 year old female born [birth year].  The patient has coronary artery disease and chronically takes  Plavix and aspirin.  She has recently also been taking ibuprofen. She has  developed epigastric pain and is passing melenic appearing stool.   ENDOSCOPIST:  Danise Edge, M.D.   PREMEDICATION:  Versed 5 mg, Demerol 50 mg.   DESCRIPTION OF PROCEDURE:  After obtaining informed consent, the patient was  placed in the left lateral decubitus position.  I administered intravenous  Demerol and intravenous Versed to achieve conscious sedation for the  procedure.  The patient's blood pressure, oxygen saturation, and cardiac  rhythm were monitored throughout the procedure and documented in the medical  record.   The Olympus gastroscope was passed through the posterior hypopharynx and  into the proximal esophagus without difficulty.  The hypopharynx, larynx,  and vocal cords appeared normal.   Esophagoscopy.  The proximal and midsegments of the esophagus appear normal.  The new squamocolumnar junction is noted at 32 cm from the incisor teeth.  There is Barrett's appearing mucosa extending from 32 cm from the incisor  teeth to the esophagogastric junction at 35 cm from the incisor teeth.  There is an area of mucosal erosion in the Barrett's mucosa which could  represent either a Barrett's mucosal ulcer or a resolving Mallory-Weiss  tear.  Multiple biopsies were taken from the Barrett's segment.   Gastroscopy.  There is a hiatal hernia.  Retroflexed view  of the gastric  cardia and fundus was normal.  The gastric body appeared normal.  In the mid  gastric antrum on the posterior wall, there is a 3 mm prepyloric gastric  ulcer with small central exudate area surrounded by intensely red, eroded  mucosa, but no bleeding or visible vessel.  Biopsies were taken.  A biopsy  was also taken from the distal gastric antrum for CLOtest.   Duodenoscopy.  The duodenal bulb and descending duodenum appeared normal.   ASSESSMENT:  1. Barrett's appearing mucosa in the distal esophagus with an associated     erosion with exudative base, representing either a Barrett's ulcer or a     healing Mallory-Weiss tear.  2. A 3 mm ulcer in the gastric antrum which was biopsied, but appears     benign.  3. CLOtest to rule out Helicobacter pylori antral gastritis pending.    RECOMMENDATIONS:  1. Discontinue aspirin, but continue Plavix.  2. Start Prilosec 20 mg daily for 12 weeks.  3. Office visit with me in approximately two-three weeks.                                               Danise Edge, M.D.    MJ/MEDQ  D:  05/11/2003  T:  05/12/2003  Job:  [id number]   cc:   Lyn Records III, M.D.  [address]  [address]  [place]  [place] [address]  Fax: [phone]

## 2011-03-21 NOTE — H&P (Signed)
NAME:  [name], [name]                           ACCOUNT NO.:  [id number]   MEDICAL RECORD NO.:  [id number]                   PATIENT TYPE:  OBV   LOCATION:  1825                                 FACILITY:  [facility]   PHYSICIAN:  Lyn Records, M.D.                DATE OF BIRTH:  1928/08/29   DATE OF ADMISSION:  05/22/2004  DATE OF DISCHARGE:                                HISTORY & PHYSICAL   HISTORY OF PRESENT ILLNESS:  [name] is a 77 year old white woman who is  admitted to [facility] for further evaluation of chest and jaw  pain; the jaw pain has been her anginal equivalent in the past.   The patient has a history of coronary artery disease which dates back to May  of 2004.  At that time, she suffered an acute lateral myocardial infarction.  She went on to cardiac catheterization and then stenting of her circumflex.  In October of 2004, she underwent repeat cardiac catheterization because an  adenosine Cardiolite demonstrated ischemia.  This catheterization  demonstrated an ejection fraction of approximately 55%.  There was a mid LAD  lesion of 50%, a patent circumflex stent, a 70% first obtuse marginal  lesion, a 60% mid right coronary artery lesion and a 70% PDA lesion.  Medical therapy was advised.  Her course has been uncomplicated until  tonight.  While packing the dishwasher, she experienced the onset of jaw  pain.  This was soon followed by an ache across her upper anterior chest.  There was no other radiation.  There was no dyspnea, diaphoresis, or nausea.  There appeared to be no exacerbating or ameliorating factors.  The  discomfort was unrelated to position, activity, meals, or respirations.  Three nitroglycerin tablets had no effect on her pain.  Hence, she presented  to the emergency department.  At this time, her chest and jaw pain have  improved, though not resolved.  The total duration of discomfort has been  approximately 3 hours.  The patient reports  that the jaw discomfort is the  same as that which heralded her acute myocardial infarction.   There is no history of congestive heart failure or arrhythmia.   The patient has a history of hypertension and dyslipidemia, both currently  under treatment.  There is also a family history of coronary artery disease.  There is no history of smoking or diabetes mellitus.   PAST MEDICAL HISTORY:  Other medical problems include:  1. Parkinson's disease.  2. Fibromyalgia.  3. Barrett's esophagus.  4. A history of peptic ulcer disease.  5. A history of chronic lower extremity edema.   MEDICATIONS:  The patient is on a number of medications for the  aforementioned problems.  These include:  1. Mirapex 1.5 mg p.o. t.i.d.  2. Selegiline 5 mg p.o. b.i.d.  3. Plavix 75 mg p.o. daily.  4. Aspirin 81 mg p.o.  daily.  5. Lipitor 20 mg p.o. daily.   ALLERGIES:  She is reportedly allergic to CODEINE.   PREVIOUS OPERATIONS:  None.  The patient is scheduled to undergo a  hysterectomy in 2 days at [facility].   SIGNIFICANT INJURIES:  None.   SOCIAL HISTORY:  The patient lives with her husband.  She does not work.  She neither smokes nor drinks.   FAMILY HISTORY:  As described above.   REVIEW OF SYSTEMS:  Review of systems reveals no new problems related to her  head, eyes, ears, nose, mouth, throat, lungs, gastrointestinal system,  genitourinary system, or extremities.  There is no history of neurologic or  psychiatric disorder.  There is no history of fever, chills, or weight loss.   PHYSICAL EXAMINATION:  VITAL SIGNS:  Blood pressure 154/71.  Pulse 66 and  regular.  Respirations 22.  Temperature 96.7.  GENERAL:  The patient was an obese, elderly white woman in no discomfort.  She was alert, oriented, appropriate, and responsive.  HEENT:  Head, eyes, nose, and mouth were normal.  NECK:  The neck was without thyromegaly or adenopathy.  Carotid pulses were  palpable  bilaterally and without bruits.  CARDIAC:  Examination revealed a normal S1 and S2.  An S4 was present.  There was no S3, murmur, rub, or click.  Cardiac rhythm was regular.  No  chest wall tenderness was noted.  LUNGS:  The lungs were clear.  ABDOMEN:  The abdomen was soft and nontender.  There was no mass,  tenderness, hepatosplenomegaly, bruit, distention, rebound, guarding, or  rigidity.  Bowel sounds were normal.  BREASTS, PELVIC, AND RECTAL:  Examinations were not performed as they were  not pertinent to the reason for acute care hospitalization.  EXTREMITIES:  The extremities were without edema, deviation, or deformity.  Radial and dorsalis pedal pulses were palpable bilaterally.  NEUROLOGIC:  Brief screening neurologic survey was unremarkable.   LABORATORY AND ACCESSORY CLINICAL DATA:  The first set of cardiac markers  revealed a CK-MB of 1.4, myoglobin 73.4, troponin less than 0.05.  The  second set of markers revealed a CK-MB of 1.1, myoglobin 60.2, and troponin  less than 0.05.  The third set revealed a CK-MB of 1.6, myoglobin 78.6, and  troponin less than 0.05.  Potassium is 4.4, BUN 21, and creatinine 0.8.   The chest radiograph revealed cardiomegaly but no evidence of active  disease, according to the radiologist.   The electrocardiogram revealed normal sinus rhythm with evidence of a prior  lateral myocardial infarction.  The remaining studies were pending at the  time of this dictation.   IMPRESSION:  1. Jaw and chest pain; rule out unstable angina.  2. Coronary artery disease.  Status post lateral myocardial infarction in     May of 2004 resulting in circumflex stent.  Last cardiac catheterization     in October of 2004 due to known positive adenosine Cardiolite; see     details above.  3. Hypertension.  4. Dyslipidemia.  5. Parkinson's disease.  6. Fibromyalgia.  7. Barrett's esophagus and history of peptic ulcer disease. 8. Chronic lower extremity edema.    PLAN:  1. Telemetry.  2. Serial cardiac enzymes.  3. Aspirin.  4. Intravenous nitroglycerin.  5. Intravenous heparin.  6. Further measures per Dr. Lyn Records.      Quita Skye. Waldon Reining, MD  Lyn Records, M.D.    MSC/MEDQ  D:  05/22/2004  T:  05/22/2004  Job:  [id number]   cc:   Lyn Records III, M.D.  [address]  [address]  [place]  [place] [address]  Fax: [phone]

## 2011-03-21 NOTE — Cardiovascular Report (Signed)
[name], WIN NO.:  [id number]   MEDICAL RECORD NO.:  [id number]          PATIENT TYPE:  OIB   LOCATION:  2899                         FACILITY:  [facility]   PHYSICIAN:  Lyn Records III, M.D.DATE OF BIRTH:  11/02/1928   DATE OF PROCEDURE:  02/11/2005  DATE OF DISCHARGE:                              CARDIAC CATHETERIZATION   INDICATIONS FOR PROCEDURE:  Ms. Revels has a history of coronary  atherosclerosis. She underwent circumflex stenting during an acute  myocardial infarction in May 2004. She had distal right coronary stent  implantation in July 2005. Over the past 5 days, has had some recurring  discomfort in the chest. This radiated up to her neck, and this caused  hospitalization at [facility] in [place] over the weekend. Her markers  were negative for myocardial infarction, but the doctors recommended she  have a repeat catheterization performed.   PROCEDURE PERFORMED:  1.  Left heart catheterization.  2.  Selective coronary angiography.  3.  Left ventriculography.  4.  AngioSeal arteriotomy closure.   DESCRIPTION:  After informed consent, a 6-French sheath was placed in the  right femoral artery using modified Seldinger technique. A 6-French A2  multipurpose catheter was used for hemodynamic recordings, left  ventriculography by hand injection, and selective right coronary  angiography. A #4 left Judkins catheter was used for left coronary  angiography. The patient tolerated the procedure without significant  complications. AngioSeal arteriotomy closure was performed after iliac  visualization indicated appropriate anatomy.   RESULTS:  1.  Hemodynamic data.      1.  Aortic pressure 148/59.      2.  Left ventricular pressure 152/18.  2.  Left ventriculography:  Overall LV function was normal. No mitral      regurgitation was noted,  3.  Coronary angiography.      1.  Left main coronary:  Calcification is noted. The vessel is widely   patent.      2.  Left anterior descending coronary:  Calcification is noted. The          vessel contains proximal mid and distal diffuse disease. Proximally          in the mid vessel, there is up to 50% narrowing, distally up to 70%          narrowing. No high-grade obstruction is noted. Two diagonal branches          arise from the LAD, the first arising proximally, and then there is          a more distal diagonal branch that is smaller. No significant          obstruction is seen.      3.  Circumflex artery:  Circumflex artery is large. It gives origin to a          small to moderate first obtuse marginal and contains ostial 85-90%          narrowing. This is chronic and was present at the time of her to          infarction  in 2004. The stent just below this is widely patent as          was the distal vessel that branches into a second obtuse marginal          and the continuation of the circumflex. The continuation of the          circumflex contains ostial 99% stenosis and is jailed by the stent.          The bifurcating second obtuse marginal is patent with the exception          of the medial most branch which contains a distal 99% stenosis          before a trifurcation. This is too small to angioplasty. Distal left-          to-right collaterals to the right coronary are noted, filling a LV          branch or possibly the PDA. This is a chronic finding in comparison          to the prior angiograms.      4.  Right coronary:  The right coronary artery is a dominant vessel. It          has been previously stented distally. It appears that the PDA is          occluded. The distal LV branches are patent. The stented region is          widely patent. There is mid vessel 40% narrowing. There is distal 50-          60% narrowing. There is proximal eccentric 60-70% narrowing. None of          these regions of stenosis are significantly different when compared          to the post-stent  angiogram performed in July 2005. The proximal          most lesion in the right coronary appears somewhat suspicious          although angiographically this is unchanged compared to July.   CONCLUSION:  1.  No significant change in coronary anatomy when compared to the post      stent pictures performed in July of 2005.  2.  Normal left ventricular function.  3.  An 80% first obtuse marginal, diffuse 50% left anterior descending      stenosis, multiple 50-70% stenoses in the proximal mid and distal right      coronary. The distal right coronary artery stent is widely patent. The      posterior descending fills late left-to-right. This is a chronic      finding.   PLAN:  Continue medical therapy, add Plavix, clinical follow-up. If  continued recurrent symptoms, consider stent of the proximal right coronary.      HWS/MEDQ  D:  02/11/2005  T:  02/11/2005  Job:  [id number]   cc:   Tasia Catchings, M.D.  [address]  [place]  [place] [address]  Fax: [phone]

## 2011-03-28 ENCOUNTER — Ambulatory Visit (HOSPITAL_COMMUNITY): Payer: Medicare Other | Attending: Orthopedic Surgery

## 2011-03-28 ENCOUNTER — Other Ambulatory Visit: Payer: Self-pay | Admitting: Orthopedic Surgery

## 2011-03-28 DIAGNOSIS — M810 Age-related osteoporosis without current pathological fracture: Secondary | ICD-10-CM | POA: Insufficient documentation

## 2011-03-28 LAB — CREATININE, SERUM: GFR calc Af Amer: 39 mL/min — ABNORMAL LOW (ref >60)

## 2011-05-23 ENCOUNTER — Other Ambulatory Visit: Payer: Self-pay | Admitting: Family Medicine

## 2011-05-23 DIAGNOSIS — R634 Abnormal weight loss: Secondary | ICD-10-CM

## 2011-05-28 ENCOUNTER — Other Ambulatory Visit: Payer: Self-pay | Admitting: Family Medicine

## 2011-05-28 ENCOUNTER — Ambulatory Visit
Admission: RE | Admit: 2011-05-28 | Discharge: 2011-05-28 | Disposition: A | Discharge status: Home / Self Care | Payer: Medicare Other | Source: Ambulatory Visit | Attending: Family Medicine | Admitting: Family Medicine

## 2011-05-28 DIAGNOSIS — R634 Abnormal weight loss: Secondary | ICD-10-CM

## 2011-07-29 LAB — COMPREHENSIVE METABOLIC PANEL
ALT: 55 — ABNORMAL HIGH
AST: 20
Albumin: 1.8 — ABNORMAL LOW
Albumin: 2 — ABNORMAL LOW
Alkaline Phosphatase: 87
BUN: 13
BUN: 77 — ABNORMAL HIGH
CO2: 19
Chloride: 115 — ABNORMAL HIGH
Chloride: 99
Creatinine, Ser: 0.96
Creatinine, Ser: 2.56 — ABNORMAL HIGH
GFR calc Af Amer: >60
GFR calc non Af Amer: 14 — ABNORMAL LOW
Glucose, Bld: 137 — ABNORMAL HIGH
Potassium: 3.7
Sodium: 130 — ABNORMAL LOW
Total Bilirubin: 0.7
Total Bilirubin: 0.9
Total Protein: 4.2 — ABNORMAL LOW
Total Protein: 4.9 — ABNORMAL LOW
Total Protein: 5.2 — ABNORMAL LOW

## 2011-07-29 LAB — LIPID PANEL
Cholesterol: 67
HDL: 18 — ABNORMAL LOW
Triglycerides: 107

## 2011-07-29 LAB — CBC
HCT: 26.1 — ABNORMAL LOW
HCT: 27.4 — ABNORMAL LOW
HCT: 32.5 — ABNORMAL LOW
Hemoglobin: 11.1 — ABNORMAL LOW
MCV: 88.1
MCV: 88.9
Platelets: 233
Platelets: 305
Platelets: 395
RBC: 3.6 — ABNORMAL LOW
RBC: 3.7 — ABNORMAL LOW
RDW: 14.8
RDW: 15.1
WBC: 12.3 — ABNORMAL HIGH
WBC: 9
WBC: 9.6

## 2011-07-29 LAB — BASIC METABOLIC PANEL
BUN: 36 — ABNORMAL HIGH
BUN: 50 — ABNORMAL HIGH
BUN: 62 — ABNORMAL HIGH
BUN: 9
CO2: 18 — ABNORMAL LOW
CO2: 32
Calcium: 7.9 — ABNORMAL LOW
Calcium: 8.1 — ABNORMAL LOW
Chloride: 115 — ABNORMAL HIGH
Chloride: 117 — ABNORMAL HIGH
Chloride: 99
Creatinine, Ser: 1.44 — ABNORMAL HIGH
Creatinine, Ser: 1.87 — ABNORMAL HIGH
GFR calc Af Amer: 32 — ABNORMAL LOW
GFR calc non Af Amer: 26 — ABNORMAL LOW
GFR calc non Af Amer: 29 — ABNORMAL LOW
GFR calc non Af Amer: 35 — ABNORMAL LOW
Glucose, Bld: 104 — ABNORMAL HIGH
Glucose, Bld: 136 — ABNORMAL HIGH
Glucose, Bld: 175 — ABNORMAL HIGH
Potassium: 3.2 — ABNORMAL LOW
Potassium: 3.8
Sodium: 140

## 2011-07-29 LAB — CULTURE, BLOOD (ROUTINE X 2): Culture: NO GROWTH

## 2011-07-29 LAB — FERRITIN: Ferritin: 202 (ref 10–291)

## 2011-07-29 LAB — URINE CULTURE: Colony Count: NO GROWTH

## 2011-07-29 LAB — POCT CARDIAC MARKERS
Myoglobin, poc: >500
Myoglobin, poc: >500
Myoglobin, poc: >500
Operator id: 4661
Operator id: 5362

## 2011-07-29 LAB — DIFFERENTIAL
Basophils Absolute: 0
Basophils Relative: 0
Eosinophils Absolute: 0
Monocytes Relative: 11
Neutrophils Relative %: 81 — ABNORMAL HIGH

## 2011-07-29 LAB — CARDIAC PANEL(CRET KIN+CKTOT+MB+TROPI)
CK, MB: 16.3 — ABNORMAL HIGH
Relative Index: 5.2 — ABNORMAL HIGH
Total CK: 315 — ABNORMAL HIGH
Troponin I: 0.05

## 2011-07-29 LAB — URINE MICROSCOPIC-ADD ON

## 2011-07-29 LAB — IRON AND TIBC
Iron: 20 — ABNORMAL LOW
Saturation Ratios: 12 — ABNORMAL LOW
TIBC: 168 — ABNORMAL LOW
UIBC: 148

## 2011-07-29 LAB — VITAMIN B12: Vitamin B-12: >2000 — ABNORMAL HIGH (ref 211–911)

## 2011-07-29 LAB — URINALYSIS, ROUTINE W REFLEX MICROSCOPIC
Bilirubin Urine: NEGATIVE
Ketones, ur: NEGATIVE
Specific Gravity, Urine: 1.016
pH: 5.5

## 2011-07-29 LAB — PROTIME-INR: INR: 1.4

## 2011-07-29 LAB — CK TOTAL AND CKMB (NOT AT ARMC): Total CK: 498 — ABNORMAL HIGH

## 2011-07-29 LAB — CORTISOL: Cortisol, Plasma: 20.3

## 2011-07-29 LAB — TSH: TSH: 1.498

## 2011-07-30 LAB — BASIC METABOLIC PANEL
BUN: 22
BUN: 22
BUN: 23
BUN: 28 — ABNORMAL HIGH
CO2: 20
CO2: 20
CO2: 20
CO2: 22
Calcium: 10
Calcium: 9
Calcium: 9.3
Calcium: 9.5
Calcium: 9.6
Calcium: 9.6
Calcium: 9.7
Chloride: 108
Chloride: 108
Chloride: 112
Creatinine, Ser: 0.86
Creatinine, Ser: 1.03
Creatinine, Ser: 1.05
Creatinine, Ser: 1.32 — ABNORMAL HIGH
GFR calc Af Amer: 47 — ABNORMAL LOW
GFR calc Af Amer: >60
GFR calc Af Amer: >60
GFR calc Af Amer: >60
GFR calc non Af Amer: 39 — ABNORMAL LOW
GFR calc non Af Amer: 39 — ABNORMAL LOW
GFR calc non Af Amer: 51 — ABNORMAL LOW
GFR calc non Af Amer: 52 — ABNORMAL LOW
GFR calc non Af Amer: >60
Glucose, Bld: 109 — ABNORMAL HIGH
Glucose, Bld: 109 — ABNORMAL HIGH
Glucose, Bld: 88
Glucose, Bld: 95
Potassium: 4
Potassium: 4.3
Potassium: 4.5
Sodium: 139
Sodium: 139
Sodium: 140
Sodium: 140
Sodium: 141

## 2011-07-30 LAB — POCT CARDIAC MARKERS
CKMB, poc: 2.8
Myoglobin, poc: 250
Operator id: 151321
Troponin i, poc: <0.05

## 2011-07-30 LAB — URINE CULTURE: Colony Count: >=100000

## 2011-07-30 LAB — CBC
HCT: 28.1 — ABNORMAL LOW
HCT: 32.1 — ABNORMAL LOW
HCT: 34 — ABNORMAL LOW
HCT: 36
Hemoglobin: 10.6 — ABNORMAL LOW
Hemoglobin: 10.7 — ABNORMAL LOW
Hemoglobin: 11.3 — ABNORMAL LOW
Hemoglobin: 11.4 — ABNORMAL LOW
Hemoglobin: 11.9 — ABNORMAL LOW
Hemoglobin: 9.5 — ABNORMAL LOW
MCHC: 33.1
MCHC: 33.3
MCHC: 33.4
MCHC: 34.2
MCHC: 34.3
MCHC: 34.5
MCV: 88.7
MCV: 89
MCV: 89.3
MCV: 89.5
Platelets: 364
Platelets: 367
Platelets: 375
RBC: 3.16 — ABNORMAL LOW
RBC: 3.36 — ABNORMAL LOW
RBC: 3.45 — ABNORMAL LOW
RBC: 3.62 — ABNORMAL LOW
RBC: 3.63 — ABNORMAL LOW
RBC: 3.72 — ABNORMAL LOW
RBC: 4.01
RDW: 14.4
RDW: 14.9
RDW: 15.1
WBC: 6.7
WBC: 7
WBC: 9
WBC: 9.4

## 2011-07-30 LAB — URINALYSIS, ROUTINE W REFLEX MICROSCOPIC
Bilirubin Urine: NEGATIVE
Glucose, UA: NEGATIVE
Hgb urine dipstick: NEGATIVE
Ketones, ur: NEGATIVE
Nitrite: NEGATIVE
Protein, ur: 30 — AB
Specific Gravity, Urine: 1.018
Urobilinogen, UA: 0.2
pH: 5.5

## 2011-07-30 LAB — CULTURE, BLOOD (ROUTINE X 2)
Culture: NO GROWTH
Culture: NO GROWTH
Culture: NO GROWTH

## 2011-07-30 LAB — POCT I-STAT, CHEM 8
BUN: 43 — ABNORMAL HIGH
Calcium, Ion: 1.05 — ABNORMAL LOW
Chloride: 106
Creatinine, Ser: 1.9 — ABNORMAL HIGH
Glucose, Bld: 120 — ABNORMAL HIGH
HCT: 32 — ABNORMAL LOW
Hemoglobin: 10.9 — ABNORMAL LOW
Potassium: 4
Sodium: 139
TCO2: 21

## 2011-07-30 LAB — COMPREHENSIVE METABOLIC PANEL
AST: 21
BUN: 37 — ABNORMAL HIGH
CO2: 21
Chloride: 108
Creatinine, Ser: 1.43 — ABNORMAL HIGH
GFR calc non Af Amer: 35 — ABNORMAL LOW
Total Bilirubin: 0.5

## 2011-07-30 LAB — DIFFERENTIAL
Basophils Absolute: 0.1
Basophils Relative: 1
Eosinophils Absolute: 0
Eosinophils Relative: 0
Lymphocytes Relative: 15
Lymphocytes Relative: 16
Lymphs Abs: 1.3
Lymphs Abs: 1.5
Monocytes Absolute: 0.7
Monocytes Relative: 10
Monocytes Relative: 7
Neutro Abs: 6.7
Neutro Abs: 7.1
Neutrophils Relative %: 74
Neutrophils Relative %: 76

## 2011-07-30 LAB — CARDIAC PANEL(CRET KIN+CKTOT+MB+TROPI)
CK, MB: 4.9 — ABNORMAL HIGH
CK, MB: 5.3 — ABNORMAL HIGH
Troponin I: 0.07 — ABNORMAL HIGH

## 2011-07-30 LAB — HEPATIC FUNCTION PANEL
ALT: 16
AST: 18
Albumin: 2.6 — ABNORMAL LOW
Bilirubin, Direct: 0.2

## 2011-07-30 LAB — URINE MICROSCOPIC-ADD ON

## 2011-07-30 LAB — SAMPLE TO BLOOD BANK

## 2011-07-30 LAB — ACTH: C206 ACTH: <5 — ABNORMAL LOW

## 2011-07-30 LAB — ACTH STIMULATION, 3 TIME POINTS: Cortisol, Base: 2

## 2011-07-30 LAB — CORTISOL-AM, BLOOD: Cortisol - AM: 1.7 — ABNORMAL LOW

## 2011-07-30 LAB — OCCULT BLOOD X 1 CARD TO LAB, STOOL: Fecal Occult Bld: NEGATIVE

## 2011-07-31 LAB — BASIC METABOLIC PANEL
BUN: 15
BUN: 38 — ABNORMAL HIGH
BUN: 55 — ABNORMAL HIGH
CO2: 21
CO2: 24
Calcium: 6.9 — ABNORMAL LOW
Calcium: 7.8 — ABNORMAL LOW
Calcium: 8 — ABNORMAL LOW
Calcium: 8.1 — ABNORMAL LOW
Calcium: 8.1 — ABNORMAL LOW
Calcium: 8.4
Chloride: 102
Chloride: 110
Chloride: 99
Creatinine, Ser: 0.76
Creatinine, Ser: 0.82
Creatinine, Ser: 1.59 — ABNORMAL HIGH
Creatinine, Ser: 1.97 — ABNORMAL HIGH
GFR calc Af Amer: 30 — ABNORMAL LOW
GFR calc Af Amer: >60
GFR calc Af Amer: >60
GFR calc Af Amer: >60
GFR calc non Af Amer: 31 — ABNORMAL LOW
GFR calc non Af Amer: 51 — ABNORMAL LOW
GFR calc non Af Amer: 58 — ABNORMAL LOW
GFR calc non Af Amer: 58 — ABNORMAL LOW
GFR calc non Af Amer: 58 — ABNORMAL LOW
GFR calc non Af Amer: >60
Glucose, Bld: 116 — ABNORMAL HIGH
Glucose, Bld: 120 — ABNORMAL HIGH
Glucose, Bld: 122 — ABNORMAL HIGH
Glucose, Bld: 133 — ABNORMAL HIGH
Potassium: 3.3 — ABNORMAL LOW
Potassium: 3.8
Potassium: 3.8
Potassium: 3.9
Potassium: 4.1
Sodium: 135
Sodium: 136
Sodium: 137
Sodium: 138
Sodium: 138
Sodium: 140

## 2011-07-31 LAB — DIFFERENTIAL
Basophils Absolute: 0
Basophils Absolute: 0
Basophils Absolute: 0
Basophils Absolute: 0
Basophils Absolute: 0
Basophils Absolute: 0
Basophils Relative: 0
Basophils Relative: 0
Eosinophils Absolute: 0
Eosinophils Absolute: 0.1
Eosinophils Absolute: 0.1
Eosinophils Relative: 1
Eosinophils Relative: 1
Lymphocytes Relative: 19
Lymphocytes Relative: 23
Lymphocytes Relative: 3 — ABNORMAL LOW
Lymphocytes Relative: 8 — ABNORMAL LOW
Lymphs Abs: 1.9
Lymphs Abs: 1.9
Monocytes Absolute: 0.5
Monocytes Absolute: 0.6
Monocytes Absolute: 0.6
Monocytes Relative: 0 — ABNORMAL LOW
Monocytes Relative: 7
Neutro Abs: 16.8 — ABNORMAL HIGH
Neutro Abs: 18.6 — ABNORMAL HIGH
Neutro Abs: 5.8
Neutro Abs: 7.4
Neutro Abs: 9.5 — ABNORMAL HIGH
Neutro Abs: 9.9 — ABNORMAL HIGH
Neutrophils Relative %: 80 — ABNORMAL HIGH
WBC Morphology: INCREASED

## 2011-07-31 LAB — MAGNESIUM
Magnesium: 1.7
Magnesium: 2.3

## 2011-07-31 LAB — CARDIAC PANEL(CRET KIN+CKTOT+MB+TROPI)
CK, MB: 3.3
CK, MB: 4.3 — ABNORMAL HIGH
Relative Index: INVALID
Relative Index: INVALID
Total CK: 12
Total CK: 12
Total CK: 18
Troponin I: 0.14 — ABNORMAL HIGH

## 2011-07-31 LAB — URINE CULTURE: Colony Count: >=100000

## 2011-07-31 LAB — URINALYSIS, ROUTINE W REFLEX MICROSCOPIC
Glucose, UA: 100 — AB
Protein, ur: >300 — AB
Specific Gravity, Urine: 1.019
Urobilinogen, UA: 1

## 2011-07-31 LAB — CBC
HCT: 26.6 — ABNORMAL LOW
HCT: 30.2 — ABNORMAL LOW
HCT: 31.3 — ABNORMAL LOW
HCT: 34.4 — ABNORMAL LOW
Hemoglobin: 10.2 — ABNORMAL LOW
Hemoglobin: 10.3 — ABNORMAL LOW
Hemoglobin: 10.6 — ABNORMAL LOW
Hemoglobin: 9.5 — ABNORMAL LOW
MCHC: 33.8
MCHC: 34.5
MCHC: 35.2
MCV: 90.7
MCV: 91
MCV: 91.8
Platelets: 135 — ABNORMAL LOW
Platelets: 148 — ABNORMAL LOW
Platelets: 148 — ABNORMAL LOW
Platelets: 159
Platelets: 182
Platelets: 189
Platelets: 192
RBC: 3.31 — ABNORMAL LOW
RDW: 15.9 — ABNORMAL HIGH
RDW: 16 — ABNORMAL HIGH
RDW: 16.1 — ABNORMAL HIGH
RDW: 16.2 — ABNORMAL HIGH
RDW: 16.2 — ABNORMAL HIGH
RDW: 16.3 — ABNORMAL HIGH
RDW: 16.4 — ABNORMAL HIGH
WBC: 10
WBC: 19.2 — ABNORMAL HIGH
WBC: 21.5 — ABNORMAL HIGH
WBC: 21.9 — ABNORMAL HIGH
WBC: 8.4

## 2011-07-31 LAB — CULTURE, BLOOD (ROUTINE X 2)
Culture: NO GROWTH
Culture: NO GROWTH

## 2011-07-31 LAB — B-NATRIURETIC PEPTIDE (CONVERTED LAB)
Pro B Natriuretic peptide (BNP): 1070 — ABNORMAL HIGH
Pro B Natriuretic peptide (BNP): 987 — ABNORMAL HIGH

## 2011-07-31 LAB — URINE MICROSCOPIC-ADD ON

## 2011-08-01 LAB — CBC
HCT: 31 — ABNORMAL LOW
Hemoglobin: 10.6 — ABNORMAL LOW
MCV: 91.9
RDW: 16.1 — ABNORMAL HIGH

## 2011-08-01 LAB — BASIC METABOLIC PANEL
Chloride: 99
GFR calc non Af Amer: 50 — ABNORMAL LOW
Glucose, Bld: 102 — ABNORMAL HIGH
Potassium: 4.4
Sodium: 135

## 2011-08-05 LAB — URINALYSIS, ROUTINE W REFLEX MICROSCOPIC
Glucose, UA: NEGATIVE
Ketones, ur: NEGATIVE
Nitrite: NEGATIVE
Specific Gravity, Urine: 1.019
pH: 5.5

## 2011-08-05 LAB — DIFFERENTIAL
Lymphocytes Relative: 22
Lymphs Abs: 1.4
Neutrophils Relative %: 66

## 2011-08-05 LAB — URINE CULTURE: Colony Count: 5000

## 2011-08-05 LAB — COMPREHENSIVE METABOLIC PANEL
CO2: 26
Calcium: 10.9 — ABNORMAL HIGH
Creatinine, Ser: 1.75 — ABNORMAL HIGH
GFR calc Af Amer: 34 — ABNORMAL LOW
GFR calc non Af Amer: 28 — ABNORMAL LOW
Glucose, Bld: 141 — ABNORMAL HIGH

## 2011-08-05 LAB — CBC
Hemoglobin: 13.5
MCHC: 32.8
MCV: 91.2
RBC: 4.52
RDW: 15.4

## 2011-08-08 LAB — DIFFERENTIAL
Basophils Absolute: 0 10*3/uL (ref 0.0–0.1)
Eosinophils Relative: 0 % (ref 0–5)
Lymphocytes Relative: 21 % (ref 12–46)
Lymphs Abs: 1.3 10*3/uL (ref 0.7–4.0)
Monocytes Absolute: 0.7 10*3/uL (ref 0.1–1.0)
Neutro Abs: 4.1 10*3/uL (ref 1.7–7.7)

## 2011-08-08 LAB — CBC
HCT: 35.7 % — ABNORMAL LOW (ref 36.0–46.0)
Hemoglobin: 11.7 g/dL — ABNORMAL LOW (ref 12.0–15.0)
RBC: 3.97 MIL/uL (ref 3.87–5.11)
RDW: 15.4 % (ref 11.5–15.5)
WBC: 6.1 10*3/uL (ref 4.0–10.5)

## 2011-08-08 LAB — POCT I-STAT, CHEM 8
BUN: 38 mg/dL — ABNORMAL HIGH (ref 6–23)
Calcium, Ion: 1.25 mmol/L (ref 1.12–1.32)
Chloride: 102 mEq/L (ref 96–112)
Creatinine, Ser: 1.9 mg/dL — ABNORMAL HIGH (ref 0.4–1.2)
TCO2: 25 mmol/L (ref 0–100)

## 2011-08-08 LAB — URINALYSIS, ROUTINE W REFLEX MICROSCOPIC
Bilirubin Urine: NEGATIVE
Glucose, UA: NEGATIVE mg/dL
Hgb urine dipstick: NEGATIVE
Ketones, ur: NEGATIVE mg/dL
Protein, ur: NEGATIVE mg/dL
Urobilinogen, UA: 0.2 mg/dL (ref 0.0–1.0)

## 2011-08-08 LAB — B-NATRIURETIC PEPTIDE (CONVERTED LAB): Pro B Natriuretic peptide (BNP): 392 pg/mL — ABNORMAL HIGH (ref 0.0–100.0)

## 2011-08-08 LAB — URINE CULTURE: Culture: NO GROWTH

## 2011-09-04 ENCOUNTER — Encounter: Payer: Self-pay | Admitting: *Deleted

## 2011-09-04 ENCOUNTER — Emergency Department (INDEPENDENT_AMBULATORY_CARE_PROVIDER_SITE_OTHER): Payer: Medicare Other

## 2011-09-04 ENCOUNTER — Emergency Department (HOSPITAL_BASED_OUTPATIENT_CLINIC_OR_DEPARTMENT_OTHER)
Admission: EM | Admit: 2011-09-04 | Discharge: 2011-09-04 | Disposition: A | Discharge status: Home / Self Care | Payer: Medicare Other | Attending: Emergency Medicine | Admitting: Emergency Medicine

## 2011-09-04 DIAGNOSIS — Y92009 Unspecified place in unspecified non-institutional (private) residence as the place of occurrence of the external cause: Secondary | ICD-10-CM | POA: Insufficient documentation

## 2011-09-04 DIAGNOSIS — T148XXA Other injury of unspecified body region, initial encounter: Secondary | ICD-10-CM

## 2011-09-04 DIAGNOSIS — Z8739 Personal history of other diseases of the musculoskeletal system and connective tissue: Secondary | ICD-10-CM | POA: Insufficient documentation

## 2011-09-04 DIAGNOSIS — G20 Parkinson's disease: Secondary | ICD-10-CM | POA: Insufficient documentation

## 2011-09-04 DIAGNOSIS — G20A1 Parkinson's disease without dyskinesia, without mention of fluctuations: Secondary | ICD-10-CM | POA: Insufficient documentation

## 2011-09-04 DIAGNOSIS — I252 Old myocardial infarction: Secondary | ICD-10-CM | POA: Insufficient documentation

## 2011-09-04 DIAGNOSIS — W64XXXA Exposure to other animate mechanical forces, initial encounter: Secondary | ICD-10-CM | POA: Insufficient documentation

## 2011-09-04 DIAGNOSIS — S8010XA Contusion of unspecified lower leg, initial encounter: Secondary | ICD-10-CM | POA: Insufficient documentation

## 2011-09-04 DIAGNOSIS — M79609 Pain in unspecified limb: Secondary | ICD-10-CM

## 2011-09-04 HISTORY — DX: Parkinson's disease without dyskinesia, without mention of fluctuations: G20.A1

## 2011-09-04 HISTORY — DX: Old myocardial infarction: I25.2

## 2011-09-04 HISTORY — DX: Parkinson's disease: G20

## 2011-09-04 HISTORY — DX: Scoliosis, unspecified: M41.9

## 2011-09-04 HISTORY — DX: Unspecified osteoarthritis, unspecified site: M19.90

## 2011-09-04 HISTORY — DX: Malignant neoplasm of bladder, unspecified: C67.9
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
## 2011-10-13 ENCOUNTER — Ambulatory Visit (INDEPENDENT_AMBULATORY_CARE_PROVIDER_SITE_OTHER): Payer: Medicare Other

## 2011-10-13 DIAGNOSIS — D649 Anemia, unspecified: Secondary | ICD-10-CM

## 2011-10-13 DIAGNOSIS — R404 Transient alteration of awareness: Secondary | ICD-10-CM

## 2011-10-13 DIAGNOSIS — R609 Edema, unspecified: Secondary | ICD-10-CM

## 2011-10-13 DIAGNOSIS — R4182 Altered mental status, unspecified: Secondary | ICD-10-CM

## 2012-03-22 ENCOUNTER — Other Ambulatory Visit: Payer: Self-pay | Admitting: Family Medicine

## 2012-03-22 DIAGNOSIS — R410 Disorientation, unspecified: Secondary | ICD-10-CM

## 2012-03-22 NOTE — Progress Notes (Signed)
Her daughter [name] reports that [name] is again having some dizziness/ fatigue which can indicate a UTI for her- they will bring her in for a UA/ micro today

## 2012-04-20 ENCOUNTER — Emergency Department (HOSPITAL_BASED_OUTPATIENT_CLINIC_OR_DEPARTMENT_OTHER): Payer: Medicare Other

## 2012-04-20 ENCOUNTER — Emergency Department (HOSPITAL_BASED_OUTPATIENT_CLINIC_OR_DEPARTMENT_OTHER)
Admission: EM | Admit: 2012-04-20 | Discharge: 2012-04-20 | Disposition: A | Discharge status: Home / Self Care | Payer: Medicare Other | Source: Home / Self Care | Attending: Emergency Medicine | Admitting: Emergency Medicine

## 2012-04-20 ENCOUNTER — Encounter (HOSPITAL_BASED_OUTPATIENT_CLINIC_OR_DEPARTMENT_OTHER): Payer: Self-pay | Admitting: *Deleted

## 2012-04-20 DIAGNOSIS — Y92009 Unspecified place in unspecified non-institutional (private) residence as the place of occurrence of the external cause: Secondary | ICD-10-CM | POA: Insufficient documentation

## 2012-04-20 DIAGNOSIS — M129 Arthropathy, unspecified: Secondary | ICD-10-CM | POA: Insufficient documentation

## 2012-04-20 DIAGNOSIS — S0083XA Contusion of other part of head, initial encounter: Secondary | ICD-10-CM

## 2012-04-20 DIAGNOSIS — W010XXA Fall on same level from slipping, tripping and stumbling without subsequent striking against object, initial encounter: Secondary | ICD-10-CM | POA: Insufficient documentation

## 2012-04-20 DIAGNOSIS — G20A1 Parkinson's disease without dyskinesia, without mention of fluctuations: Secondary | ICD-10-CM | POA: Insufficient documentation

## 2012-04-20 DIAGNOSIS — G20 Parkinson's disease: Secondary | ICD-10-CM | POA: Insufficient documentation

## 2012-04-20 DIAGNOSIS — S0003XA Contusion of scalp, initial encounter: Secondary | ICD-10-CM | POA: Insufficient documentation

## 2012-04-20 DIAGNOSIS — I252 Old myocardial infarction: Secondary | ICD-10-CM | POA: Insufficient documentation

## 2012-04-20 DIAGNOSIS — Z8551 Personal history of malignant neoplasm of bladder: Secondary | ICD-10-CM | POA: Insufficient documentation

## 2012-04-20 DIAGNOSIS — M4120 Other idiopathic scoliosis, site unspecified: Secondary | ICD-10-CM | POA: Insufficient documentation

## 2012-04-20 NOTE — ED Notes (Signed)
Pt to room 7 in w/c, pt and husband report that she got up to use the restroom this morning and lost her balance, hitting her head on the tile floor. Pt and husband deny that pt had loc, pt has large contusion/hematoma to right temple/forhead area. Moe + x 4 ext, husband states pt has seemed at her baseline ms since fall. Pt is awake and alert and denies any pain, or ha.

## 2012-04-20 NOTE — ED Provider Notes (Signed)
History     CSN: [id number]  Arrival date & time 04/20/12  1224   First MD Initiated Contact with Patient 04/20/12 1258      Chief Complaint  Patient presents with  . Head Injury  . Fall     HPI Pt to room 7 in w/c, pt and husband report that she got up to use the restroom this morning and lost her balance, hitting her head on the tile floor. Pt and husband deny that pt had loc, pt has large contusion/hematoma to right temple/forhead area. Moe + x 4 ext, husband states pt has seemed at her baseline ms since fall. Pt is awake and alert and denies any pain, or ha  Past Medical History  Diagnosis Date  . MI, old   . Parkinson disease   . Scoliosis   . Arthritis   . Bladder cancer     Past Surgical History  Procedure Date  . Tonsillectomy   . Appendectomy   . Abdominal hysterectomy     History reviewed. No pertinent family history.  History  Substance Use Topics  . Smoking status: Never Smoker   . Smokeless tobacco: Not on file  . Alcohol Use: No    OB History    Grav Para Term Preterm Abortions TAB SAB Ect Mult Living                  Review of Systems  All other systems reviewed and are negative.    Allergies  Codeine  Home Medications   Current Outpatient Rx  Name Route Sig Dispense Refill  . AMANTADINE HCL 100 MG PO CAPS Oral Take 100 mg by mouth 2 (two) times daily.    . ASPIRIN 325 MG PO TABS Oral Take 325 mg by mouth daily.    Marland Kitchen CALCIUM CARBONATE 1250 MG PO TABS Oral Take 1 tablet by mouth daily.    Marland Kitchen CARBIDOPA-LEVODOPA 10-100 MG PO TABS Oral Take 1 tablet by mouth 3 (three) times daily.    Marland Kitchen CLONAZEPAM 0.5 MG PO TABS Oral Take 0.5 mg by mouth at bedtime as needed.    . DONEPEZIL HCL 5 MG PO TABS Oral Take 5 mg by mouth at bedtime as needed.    . FUROSEMIDE 40 MG PO TABS Oral Take 40 mg by mouth daily.    Marland Kitchen MIRABEGRON ER 50 MG PO TB24 Oral Take by mouth daily.    Marland Kitchen OMEPRAZOLE 10 MG PO CPDR Oral Take 10 mg by mouth daily.    . TRAMADOL HCL 50 MG  PO TABS Oral Take 50 mg by mouth every 6 (six) hours as needed.      Pulse 79  Temp 98.4 F (36.9 C) (Oral)  Resp 18  Physical Exam  Nursing note and vitals reviewed. Constitutional: She is oriented to person, place, and time. She appears well-developed and well-nourished. No distress.  HENT:  Head: Normocephalic.    Eyes: Pupils are equal, round, and reactive to light.  Neck: Normal range of motion.  Cardiovascular: Normal rate and intact distal pulses.   Pulmonary/Chest: No respiratory distress.  Abdominal: Normal appearance. She exhibits no distension.  Musculoskeletal: Normal range of motion.  Neurological: She is alert and oriented to person, place, and time. She has normal strength. No cranial nerve deficit or sensory deficit. GCS eye subscore is 4. GCS verbal subscore is 5. GCS motor subscore is 6.  Skin: Skin is warm and dry. No rash noted.  Psychiatric: She has a normal  mood and affect. Her behavior is normal.    ED Course  Procedures (including critical care time)  Labs Reviewed - No data to display Ct Head Wo Contrast  04/20/2012  *RADIOLOGY REPORT*  Clinical Data: Fall, swelling and bruising  CT HEAD WITHOUT CONTRAST  Technique:  Contiguous axial images were obtained from the base of the skull through the vertex without contrast.  Comparison: 1/49/10  Findings: There is scalp swelling and subcutaneous stranding in the right frontal region.  A  subcutaneous hematoma in the right frontal region measures 2.8 x 1 cm.  No intracranial hemorrhage, mass effect or midline shift. No skull fracture is identified.  No acute infarction.  No mass lesion is noted on this unenhanced scan.  Stable cerebral atrophy.  There is mucosal thickening with almost complete opacification of the right maxillary sinus.  The mastoid air cells are unremarkable.  Atherosclerotic calcifications of carotid siphon are again noted.  Atherosclerotic calcifications of vertebral arteries.  Stable  periventricular chronic white matter disease.  IMPRESSION: No acute intracranial abnormality.  There is soft tissue swelling and scalp hematoma in the right frontal region, hematoma measures 2.8 x 1 cm.  Stable cerebral atrophy and chronic white matter disease. Basilar thickening with almost complete opacification of the right maxillary sinus.  Original Report Authenticated By: Natasha Mead, M.D.     1. Traumatic hematoma of forehead       MDM         Nelia Shi, MD 04/20/12 [id number]

## 2012-04-20 NOTE — ED Notes (Signed)
Pt husband states that pt has had mental status decline over the last few weeks due to her parkinson's and has seen her neurologist and her pcp and had her medications adjusted several times. Mr. Torbert states that pt ms has not changed from her baseline in the last 3 weeks. Pt noted to be sleepy, arouses to spoken name and light tactile stim, follows commands, but is confused to time, place, and situation.

## 2012-04-20 NOTE — Discharge Instructions (Signed)
Contusion A contusion is a deep bruise. Contusions happen when an injury causes bleeding under the skin. Signs of bruising include pain, puffiness (swelling), and discolored skin. The contusion may turn blue, purple, or yellow. HOME CARE   Put ice on the injured area.   Put ice in a plastic bag.   Place a towel between your skin and the bag.   Leave the ice on for 15 to 20 minutes, 3 to 4 times a day.   Only take medicine as told by your doctor.   Rest the injured area.   If possible, raise (elevate) the injured area to lessen puffiness.  GET HELP RIGHT AWAY IF:   You have more bruising or puffiness.   You have pain that is getting worse.   Your puffiness or pain is not helped by medicine.  MAKE SURE YOU:   Understand these instructions.   Will watch your condition.   Will get help right away if you are not doing well or get worse.  Document Released: 04/07/2008 Document Revised: 10/09/2011 Document Reviewed: 08/25/2011 [employer] Patient Information 2012 [employer].Head Injury, Adult You have had a head injury that does not appear serious at this time. A concussion is a state of changed mental ability, usually from a blow to the head. You should take clear liquids for the rest of the day and then resume your regular diet. You should not take sedatives or alcoholic beverages for as long as directed by your caregiver after discharge. After injuries such as yours, most problems occur within the first 24 hours. SYMPTOMS These minor symptoms may be experienced after discharge:  Memory difficulties.   Dizziness.   Headaches.   Double vision.   Hearing difficulties.   Depression.   Tiredness.   Weakness.   Difficulty with concentration.  If you experience any of these problems, you should not be alarmed. A concussion requires a few days for recovery. Many patients with head injuries frequently experience such symptoms. Usually, these problems disappear without  medical care. If symptoms last for more than one day, notify your caregiver. See your caregiver sooner if symptoms are becoming worse rather than better. HOME CARE INSTRUCTIONS   During the next 24 hours you must stay with someone who can watch you for the warning signs listed below.  Although it is unlikely that serious side effects will occur, you should be aware of signs and symptoms which may necessitate your return to this location. Side effects may occur up to 7 - 10 days following the injury. It is important for you to carefully monitor your condition and contact your caregiver or seek immediate medical attention if there is a change in your condition. SEEK IMMEDIATE MEDICAL CARE IF:   There is confusion or drowsiness.   You can not awaken the injured person.   There is nausea (feeling sick to your stomach) or continued, forceful vomiting.   You notice dizziness or unsteadiness which is getting worse, or inability to walk.   You have convulsions or unconsciousness.   You experience severe, persistent headaches not relieved by over-the-counter or prescription medicines for pain. (Do not take aspirin as this impairs clotting abilities). Take other pain medications only as directed.   You can not use arms or legs normally.   There is clear or bloody discharge from the nose or ears.  MAKE SURE YOU:   Understand these instructions.   Will watch your condition.   Will get help right away if you are   not doing well or get worse.  Document Released: 10/20/2005 Document Revised: 10/09/2011 Document Reviewed: 09/07/2009 [employer] Patient Information 2012 [employer] 

## 2012-04-21 ENCOUNTER — Emergency Department (HOSPITAL_COMMUNITY): Payer: Medicare Other

## 2012-04-21 ENCOUNTER — Inpatient Hospital Stay (HOSPITAL_COMMUNITY): Payer: Medicare Other

## 2012-04-21 ENCOUNTER — Inpatient Hospital Stay (HOSPITAL_COMMUNITY)
Admission: EM | Admit: 2012-04-21 | Discharge: 2012-04-28 | DRG: 682 | Disposition: A | Discharge status: Skilled Nursing Facility | Payer: Medicare Other | Attending: Family Medicine | Admitting: Family Medicine

## 2012-04-21 ENCOUNTER — Encounter (HOSPITAL_COMMUNITY): Payer: Self-pay

## 2012-04-21 DIAGNOSIS — E119 Type 2 diabetes mellitus without complications: Secondary | ICD-10-CM

## 2012-04-21 DIAGNOSIS — Z66 Do not resuscitate: Secondary | ICD-10-CM | POA: Diagnosis present

## 2012-04-21 DIAGNOSIS — D649 Anemia, unspecified: Secondary | ICD-10-CM | POA: Diagnosis present

## 2012-04-21 DIAGNOSIS — E860 Dehydration: Secondary | ICD-10-CM | POA: Diagnosis present

## 2012-04-21 DIAGNOSIS — I129 Hypertensive chronic kidney disease with stage 1 through stage 4 chronic kidney disease, or unspecified chronic kidney disease: Secondary | ICD-10-CM | POA: Diagnosis present

## 2012-04-21 DIAGNOSIS — R296 Repeated falls: Secondary | ICD-10-CM

## 2012-04-21 DIAGNOSIS — E876 Hypokalemia: Secondary | ICD-10-CM | POA: Diagnosis present

## 2012-04-21 DIAGNOSIS — G9340 Encephalopathy, unspecified: Secondary | ICD-10-CM | POA: Diagnosis present

## 2012-04-21 DIAGNOSIS — G20A1 Parkinson's disease without dyskinesia, without mention of fluctuations: Secondary | ICD-10-CM | POA: Diagnosis present

## 2012-04-21 DIAGNOSIS — I7300 Raynaud's syndrome without gangrene: Secondary | ICD-10-CM

## 2012-04-21 DIAGNOSIS — E785 Hyperlipidemia, unspecified: Secondary | ICD-10-CM

## 2012-04-21 DIAGNOSIS — K279 Peptic ulcer, site unspecified, unspecified as acute or chronic, without hemorrhage or perforation: Secondary | ICD-10-CM

## 2012-04-21 DIAGNOSIS — E2740 Unspecified adrenocortical insufficiency: Secondary | ICD-10-CM

## 2012-04-21 DIAGNOSIS — Z8551 Personal history of malignant neoplasm of bladder: Secondary | ICD-10-CM

## 2012-04-21 DIAGNOSIS — N289 Disorder of kidney and ureter, unspecified: Secondary | ICD-10-CM

## 2012-04-21 DIAGNOSIS — K5900 Constipation, unspecified: Secondary | ICD-10-CM | POA: Diagnosis present

## 2012-04-21 DIAGNOSIS — G20 Parkinson's disease: Secondary | ICD-10-CM

## 2012-04-21 DIAGNOSIS — I2510 Atherosclerotic heart disease of native coronary artery without angina pectoris: Secondary | ICD-10-CM

## 2012-04-21 DIAGNOSIS — F29 Unspecified psychosis not due to a substance or known physiological condition: Secondary | ICD-10-CM

## 2012-04-21 DIAGNOSIS — F411 Generalized anxiety disorder: Secondary | ICD-10-CM | POA: Diagnosis present

## 2012-04-21 DIAGNOSIS — E43 Unspecified severe protein-calorie malnutrition: Secondary | ICD-10-CM | POA: Diagnosis present

## 2012-04-21 DIAGNOSIS — R4182 Altered mental status, unspecified: Secondary | ICD-10-CM | POA: Diagnosis present

## 2012-04-21 DIAGNOSIS — N179 Acute kidney failure, unspecified: Principal | ICD-10-CM | POA: Diagnosis present

## 2012-04-21 DIAGNOSIS — F419 Anxiety disorder, unspecified: Secondary | ICD-10-CM | POA: Diagnosis present

## 2012-04-21 DIAGNOSIS — M797 Fibromyalgia: Secondary | ICD-10-CM

## 2012-04-21 DIAGNOSIS — N189 Chronic kidney disease, unspecified: Secondary | ICD-10-CM | POA: Diagnosis present

## 2012-04-21 DIAGNOSIS — K2270 Barrett's esophagus without dysplasia: Secondary | ICD-10-CM

## 2012-04-21 DIAGNOSIS — R600 Localized edema: Secondary | ICD-10-CM

## 2012-04-21 DIAGNOSIS — I10 Essential (primary) hypertension: Secondary | ICD-10-CM

## 2012-04-21 DIAGNOSIS — IMO0001 Reserved for inherently not codable concepts without codable children: Secondary | ICD-10-CM | POA: Diagnosis present

## 2012-04-21 DIAGNOSIS — W19XXXA Unspecified fall, initial encounter: Secondary | ICD-10-CM

## 2012-04-21 HISTORY — DX: Essential (primary) hypertension: I10

## 2012-04-21 HISTORY — DX: Type 2 diabetes mellitus without complications: E11.9

## 2012-04-21 HISTORY — DX: Peptic ulcer, site unspecified, unspecified as acute or chronic, without hemorrhage or perforation: K27.9

## 2012-04-21 HISTORY — DX: Raynaud's syndrome without gangrene: I73.00

## 2012-04-21 HISTORY — DX: Unspecified adrenocortical insufficiency: E27.40

## 2012-04-21 HISTORY — DX: Hyperlipidemia, unspecified: E78.5

## 2012-04-21 HISTORY — DX: Unspecified fall, initial encounter: W19.XXXA

## 2012-04-21 HISTORY — DX: Repeated falls: R29.6

## 2012-04-21 HISTORY — DX: Barrett's esophagus without dysplasia: K22.70

## 2012-04-21 HISTORY — DX: Localized edema: R60.0

## 2012-04-21 HISTORY — DX: Personal history of malignant neoplasm of bladder: Z85.51

## 2012-04-21 HISTORY — DX: Anxiety disorder, unspecified: F41.9

## 2012-04-21 HISTORY — DX: Fibromyalgia: M79.7

## 2012-04-21 HISTORY — DX: Atherosclerotic heart disease of native coronary artery without angina pectoris: I25.10

## 2012-04-21 LAB — COMPREHENSIVE METABOLIC PANEL
ALT: 6 U/L (ref 0–35)
Alkaline Phosphatase: 92 U/L (ref 39–117)
BUN: 60 mg/dL — ABNORMAL HIGH (ref 6–23)
CO2: 27 mEq/L (ref 19–32)
GFR calc Af Amer: 13 mL/min — ABNORMAL LOW (ref >90)
GFR calc non Af Amer: 11 mL/min — ABNORMAL LOW (ref >90)
Glucose, Bld: 80 mg/dL (ref 70–99)
Potassium: 3.6 mEq/L (ref 3.5–5.1)
Sodium: 140 mEq/L (ref 135–145)
Total Bilirubin: 0.4 mg/dL (ref 0.3–1.2)

## 2012-04-21 LAB — URINALYSIS, ROUTINE W REFLEX MICROSCOPIC
Bilirubin Urine: NEGATIVE
Glucose, UA: NEGATIVE mg/dL
Nitrite: NEGATIVE
Specific Gravity, Urine: 1.015 (ref 1.005–1.030)
pH: 6.5 (ref 5.0–8.0)

## 2012-04-21 LAB — URINE MICROSCOPIC-ADD ON

## 2012-04-21 LAB — CBC
HCT: 36.8 % (ref 36.0–46.0)
Hemoglobin: 12.1 g/dL (ref 12.0–15.0)
Hemoglobin: 11.9 g/dL — ABNORMAL LOW (ref 12.0–15.0)
MCV: 84.4 fL (ref 78.0–100.0)
Platelets: 276 10*3/uL (ref 150–400)
RBC: 4.32 MIL/uL (ref 3.87–5.11)
RBC: 4.29 MIL/uL (ref 3.87–5.11)
WBC: 6.3 10*3/uL (ref 4.0–10.5)

## 2012-04-21 LAB — PHOSPHORUS: Phosphorus: 4 mg/dL (ref 2.3–4.6)

## 2012-04-21 LAB — GLUCOSE, CAPILLARY
Glucose-Capillary: 69 mg/dL — ABNORMAL LOW (ref 70–99)
Glucose-Capillary: 181 mg/dL — ABNORMAL HIGH (ref 70–99)

## 2012-04-21 LAB — CREATININE, SERUM: GFR calc Af Amer: 16 mL/min — ABNORMAL LOW (ref >90)

## 2012-04-21 LAB — MAGNESIUM: Magnesium: 2.2 mg/dL (ref 1.5–2.5)

## 2012-04-21 LAB — HEMOGLOBIN A1C: Hgb A1c MFr Bld: 6.4 % — ABNORMAL HIGH (ref <5.7)

## 2012-04-21 MED ORDER — SODIUM CHLORIDE 0.9 % IV BOLUS (SEPSIS)
1000.0000 mL | Freq: Once | INTRAVENOUS | Status: AC
  Administered 2012-04-21: 1000 mL via INTRAVENOUS

## 2012-04-21 MED ORDER — ACETAMINOPHEN 650 MG RE SUPP: 650.0000 mg | Freq: Four times a day (QID) | RECTAL | Status: DC | PRN

## 2012-04-21 MED ORDER — NONFORMULARY OR COMPOUNDED ITEM
0.5000 | Freq: Every day | Status: DC
  Filled 2012-04-21 (×2): qty 1

## 2012-04-21 MED ORDER — LORAZEPAM 2 MG/ML IJ SOLN
0.5000 mg | Freq: Once | INTRAMUSCULAR | Status: AC
  Administered 2012-04-21: 0.5 mg via INTRAVENOUS
  Filled 2012-04-21: qty 1

## 2012-04-21 MED ORDER — DEXTROSE 50 % IV SOLN
1.0000 | Freq: Once | INTRAVENOUS | Status: AC
  Administered 2012-04-21: 50 mL via INTRAVENOUS
  Filled 2012-04-21: qty 50

## 2012-04-21 MED ORDER — SODIUM CHLORIDE 0.9 % IV SOLN
INTRAVENOUS | Status: DC
  Administered 2012-04-21: 100 mL/h via INTRAVENOUS
  Administered 2012-04-22: 02:00:00 via INTRAVENOUS

## 2012-04-21 MED ORDER — INSULIN ASPART 100 UNIT/ML ~~LOC~~ SOLN
0.0000 [IU] | Freq: Three times a day (TID) | SUBCUTANEOUS | Status: DC
  Administered 2012-04-22: 1 [IU] via SUBCUTANEOUS

## 2012-04-21 MED ORDER — NONFORMULARY OR COMPOUNDED ITEM
0.5000 | Freq: Every day | Status: DC
  Filled 2012-04-21: qty 1

## 2012-04-21 MED ORDER — ONDANSETRON HCL 4 MG PO TABS: 4.0000 mg | ORAL_TABLET | Freq: Four times a day (QID) | ORAL | Status: DC | PRN

## 2012-04-21 MED ORDER — ACETAMINOPHEN 325 MG PO TABS: 650.0000 mg | ORAL_TABLET | Freq: Four times a day (QID) | ORAL | Status: DC | PRN

## 2012-04-21 MED ORDER — SODIUM CHLORIDE 0.9 % IV SOLN
60.0000 mg | Freq: Once | INTRAVENOUS | Status: AC
  Administered 2012-04-21: 60 mg via INTRAVENOUS
  Filled 2012-04-21: qty 500

## 2012-04-21 MED ORDER — PANTOPRAZOLE SODIUM 40 MG PO TBEC
40.0000 mg | DELAYED_RELEASE_TABLET | Freq: Every day | ORAL | Status: DC
  Administered 2012-04-25 – 2012-04-28 (×4): 40 mg via ORAL
  Filled 2012-04-21 (×6): qty 1

## 2012-04-21 MED ORDER — METOPROLOL TARTRATE 12.5 MG HALF TABLET
12.5000 mg | ORAL_TABLET | Freq: Two times a day (BID) | ORAL | Status: DC
  Filled 2012-04-21 (×2): qty 1

## 2012-04-21 MED ORDER — HEPARIN SODIUM (PORCINE) 5000 UNIT/ML IJ SOLN
5000.0000 [IU] | Freq: Three times a day (TID) | INTRAMUSCULAR | Status: DC
  Administered 2012-04-21 – 2012-04-28 (×18): 5000 [IU] via SUBCUTANEOUS
  Filled 2012-04-21 (×23): qty 1

## 2012-04-21 MED ORDER — CARBIDOPA-LEVODOPA CR 25-100 MG PO TBCR
1.0000 | EXTENDED_RELEASE_TABLET | Freq: Every morning | ORAL | Status: DC
  Administered 2012-04-22 – 2012-04-23 (×2): 1 via ORAL
  Filled 2012-04-21 (×2): qty 1

## 2012-04-21 MED ORDER — CARBIDOPA-LEVODOPA CR 25-100 MG PO TBCR: 0.5000 | EXTENDED_RELEASE_TABLET | Freq: Two times a day (BID) | ORAL | Status: DC

## 2012-04-21 MED ORDER — TRAMADOL HCL 50 MG PO TABS
50.0000 mg | ORAL_TABLET | Freq: Two times a day (BID) | ORAL | Status: DC | PRN
  Filled 2012-04-21: qty 1

## 2012-04-21 MED ORDER — POLYETHYLENE GLYCOL 3350 17 G PO PACK
17.0000 g | PACK | Freq: Every day | ORAL | Status: DC | PRN
  Administered 2012-04-24: 17 g via ORAL
  Filled 2012-04-21: qty 1

## 2012-04-21 MED ORDER — SODIUM CHLORIDE 0.9 % IV SOLN
INTRAVENOUS | Status: AC
  Administered 2012-04-21: via INTRAVENOUS

## 2012-04-21 MED ORDER — OMEGA-3-ACID ETHYL ESTERS 1 G PO CAPS
2.0000 g | ORAL_CAPSULE | Freq: Every day | ORAL | Status: DC
  Administered 2012-04-22 – 2012-04-27 (×5): 2 g via ORAL
  Filled 2012-04-21 (×8): qty 2

## 2012-04-21 MED ORDER — SODIUM CHLORIDE 0.9 % IJ SOLN
3.0000 mL | Freq: Two times a day (BID) | INTRAMUSCULAR | Status: DC
  Administered 2012-04-24 – 2012-04-28 (×4): 3 mL via INTRAVENOUS

## 2012-04-21 MED ORDER — DOCUSATE SODIUM 100 MG PO CAPS
100.0000 mg | ORAL_CAPSULE | Freq: Two times a day (BID) | ORAL | Status: DC
  Administered 2012-04-22 – 2012-04-28 (×12): 100 mg via ORAL
  Filled 2012-04-21 (×15): qty 1

## 2012-04-21 MED ORDER — AMANTADINE HCL 100 MG PO CAPS
100.0000 mg | ORAL_CAPSULE | Freq: Every day | ORAL | Status: DC
  Administered 2012-04-22 – 2012-04-28 (×7): 100 mg via ORAL
  Filled 2012-04-21 (×7): qty 1

## 2012-04-21 MED ORDER — OCUVITE PO TABS
1.0000 | ORAL_TABLET | Freq: Every day | ORAL | Status: DC
  Administered 2012-04-22 – 2012-04-28 (×6): 1 via ORAL
  Filled 2012-04-21 (×9): qty 1

## 2012-04-21 MED ORDER — PRAMIPEXOLE DIHYDROCHLORIDE ER 0.375 MG PO TB24: 1.0000 | ORAL_TABLET | Freq: Every day | ORAL | Status: DC

## 2012-04-21 MED ORDER — MIRABEGRON ER 50 MG PO TB24
50.0000 mg | ORAL_TABLET | Freq: Every day | ORAL | Status: DC
  Administered 2012-04-22 – 2012-04-28 (×7): 50 mg via ORAL
  Filled 2012-04-21 (×8): qty 1

## 2012-04-21 MED ORDER — HYDRALAZINE HCL 20 MG/ML IJ SOLN
10.0000 mg | Freq: Four times a day (QID) | INTRAMUSCULAR | Status: DC | PRN
  Administered 2012-04-21 – 2012-04-27 (×3): 10 mg via INTRAVENOUS
  Filled 2012-04-21 (×4): qty 0.5

## 2012-04-21 MED ORDER — BISACODYL 5 MG PO TBEC
5.0000 mg | DELAYED_RELEASE_TABLET | Freq: Every day | ORAL | Status: DC | PRN
  Administered 2012-04-24: 5 mg via ORAL
  Filled 2012-04-21: qty 1

## 2012-04-21 MED ORDER — ONDANSETRON HCL 4 MG/2ML IJ SOLN: 4.0000 mg | Freq: Four times a day (QID) | INTRAMUSCULAR | Status: DC | PRN

## 2012-04-21 MED ORDER — DONEPEZIL HCL 5 MG PO TABS: 5.0000 mg | ORAL_TABLET | Freq: Every day | ORAL | Status: DC

## 2012-04-21 MED ORDER — ASPIRIN 325 MG PO TABS
325.0000 mg | ORAL_TABLET | Freq: Every day | ORAL | Status: DC
  Administered 2012-04-22 – 2012-04-28 (×7): 325 mg via ORAL
  Filled 2012-04-21 (×8): qty 1

## 2012-04-21 MED ORDER — CLONAZEPAM 0.5 MG PO TABS
0.5000 mg | ORAL_TABLET | Freq: Every day | ORAL | Status: DC
  Administered 2012-04-22 – 2012-04-27 (×6): 0.5 mg via ORAL
  Filled 2012-04-21 (×6): qty 1

## 2012-04-21 MED ORDER — ALUM & MAG HYDROXIDE-SIMETH 200-200-20 MG/5ML PO SUSP
30.0000 mL | Freq: Four times a day (QID) | ORAL | Status: DC | PRN
  Filled 2012-04-21: qty 30

## 2012-04-21 NOTE — H&P (Signed)
[name] MRN: [id number] DOB/AGE: [birth year] 76 y.o. Primary Care Physician:SMITH,CANDACE THIELE, MD Admit date: 04/21/2012 Chief Complaint: Altered mental status/increasing confusion HPI:  [name] is a 78 year old Caucasian female with history of Parkinson's disease, hypertension, hyperlipidemia, diabetes, coronary artery disease, Barrett's esophagus, history of peptic ulcer disease, prior history of adrenal insufficiency which has since resolved per family, who presents to the ED with a 3 week history of worsening confusion and falls. Patient is alert to self and place and year however during conversation with the patient she extubates confusion. Most of the history is obtained from the ED records and per patient's husband. Per patient's husband patient lives at home with her and over the past 3 weeks has been having worsening confusion increased tremor status, worsening ataxia and multiple falls. Patient fell 2 days prior to admission was seen in the ED one day prior to admission head CT which was done was negative patient was subsequently sent home. On the day of admission patient's husband states that patient was difficult to arouse and confused. EMS was called and patient was subsequently brought to the ED. Patient denies any fever, no chills, no nausea, no vomiting, no chest pain, no shortness of breath, no abdominal pain, no diarrhea, no dysuria, no cough. Patient does endorse some constipation with hard stools and generalized weakness. Per patient's husband has selegiline was discontinued per neurologist and patient started on Aricept recently. Per husband since discontinuation of patient's selegiline her ataxia has worsened and she had increased tremors. Patient was seen in the ED lab work which was obtained did show patient did have an elevated calcium of 13 and a BUN/creatinine of 60/3.5. Patient was given a liter bolus of IV fluids. Will call to admit the patient for further evaluation and  management.  Past Medical History  Diagnosis Date  . MI, old   . Parkinson disease   . Scoliosis   . Arthritis   . Bladder cancer   . HTN (hypertension) 04/21/2012  . Hyperlipidemia 04/21/2012  . DM (diabetes mellitus) 04/21/2012  . Parkinson's disease 04/21/2012  . CAD (coronary artery disease) 04/21/2012  . Barrett esophagus 04/21/2012  . PUD (peptic ulcer disease) 04/21/2012  . Adrenal insufficiency 04/21/2012  . Raynaud's disease 04/21/2012  . Anxiety 04/21/2012  . Fibromyalgia 04/21/2012  . Hx of bladder cancer 04/21/2012  . Edema of lower extremity 04/21/2012    Chronic lower extremity edema  . Falls 04/21/2012    Past Surgical History  Procedure Date  . Tonsillectomy   . Appendectomy   . Abdominal hysterectomy     Prior to Admission medications   Medication Sig Start Date End Date Taking? Authorizing Provider  amantadine (SYMMETREL) 100 MG capsule Take 100 mg by mouth daily.    Yes Historical Provider, MD  aspirin 325 MG tablet Take 325 mg by mouth daily.   Yes Historical Provider, MD  CALCIUM-VITAMIN D PO Take 1 tablet by mouth 2 (two) times daily.   Yes Historical Provider, MD  carbidopa-levodopa (SINEMET CR) 25-100 MG per tablet Take 0.5-1 tablets by mouth 2 (two) times daily. 1 tablet in the morning 0.5 tablet in the evening   Yes Historical Provider, MD  Cholecalciferol (VITAMIN D3) 2000 UNITS TABS Take 1 tablet by mouth 2 (two) times daily.   Yes Historical Provider, MD  clonazePAM (KLONOPIN) 0.5 MG tablet Take 0.5 mg by mouth at bedtime.    Yes Historical Provider, MD  donepezil (ARICEPT) 5 MG tablet Take 5 mg by mouth  daily.    Yes Historical Provider, MD  furosemide (LASIX) 40 MG tablet Take 40 mg by mouth daily.   Yes Historical Provider, MD  mirabegron ER (MYRBETRIQ) 50 MG TB24 Take by mouth daily.   Yes Historical Provider, MD  Multiple Vitamins-Minerals (OCUVITE PO) Take 1 tablet by mouth every evening.   Yes Historical Provider, MD  omega-3 acid ethyl esters  (LOVAZA) 1 G capsule Take 2 g by mouth every evening.   Yes Historical Provider, MD  OMEPRAZOLE PO Take 1 capsule by mouth daily.   Yes Historical Provider, MD  Pramipexole Dihydrochloride (MIRAPEX ER) 0.375 MG TB24 Take 1 tablet by mouth every morning.   Yes Historical Provider, MD  traMADol (ULTRAM) 50 MG tablet Take 50 mg by mouth daily as needed. For pain   Yes Historical Provider, MD    Allergies:  Allergies  Allergen Reactions  . Codeine Hives    History reviewed. No pertinent family history.  Social History:  reports that she has never smoked. She does not have any smokeless tobacco history on file. She reports that she does not drink alcohol or use illicit drugs.  ROS: All systems reviewed with the patient and was positive as per HPI otherwise all other systems are negative.  PHYSICAL EXAM: Blood pressure 181/96, pulse 62, temperature 97.7 F (36.5 C), temperature source Oral, resp. rate 17, SpO2 93.00%. General: Well-developed well-nourished in no acute cardiopulmonary distress. HEENT: Normocephalic atraumatic. Pupils equal round and reactive to light and accommodation. Patient with a large area of ecchymosis around the right eye. Oropharynx is clear, no lesions no exudates. Extremely dry mucous membranes. No bruits, no goiter. Heart: Regular rate and rhythm, without murmurs, rubs, gallops. Lungs: Clear to auscultation bilaterally. Abdomen: Soft, nontender, nondistended, positive bowel sounds. Extremities: No clubbing cyanosis or edema with positive pedal pulses. Neuro: Alert and oriented to place self and time. Cranial nerves II through XII are grossly intact. Sensation is intact. 5 over 5 bilateral upper extremity strength. Father 5 bilateral lower extremity strength. Unable to elicit reflexes symmetrically and diffusely. Visual fields are intact. Gait not tested secondary to safety.     EKG: Normal sinus rhythm. Q waves in aVL. No significant change from prior EKG.  No  results found for this or any previous visit (from the past 240 hour(s)).   Lab results:  [facility] 04/21/12 0941  NA 140  K 3.6  CL 98  CO2 27  GLUCOSE 80  BUN 60*  CREATININE 3.50*  CALCIUM 13.7*  MG --  PHOS --    Basename 04/21/12 0941  AST 32  ALT 6  ALKPHOS 92  BILITOT 0.4  PROT 7.4  ALBUMIN 3.9   No results found for this basename: LIPASE:2,AMYLASE:2 in the last 72 hours  Basename 04/21/12 0941  WBC 5.9  NEUTROABS --  HGB 12.1  HCT 36.8  MCV 85.2  PLT 294   No results found for this basename: CKTOTAL:3,CKMB:3,CKMBINDEX:3,TROPONINI:3 in the last 72 hours No components found with this basename: POCBNP:3 No results found for this basename: DDIMER in the last 72 hours No results found for this basename: HGBA1C:2 in the last 72 hours No results found for this basename: CHOL:2,HDL:2,LDLCALC:2,TRIG:2,CHOLHDL:2,LDLDIRECT:2 in the last 72 hours No results found for this basename: TSH,T4TOTAL,FREET3,T3FREE,THYROIDAB in the last 72 hours No results found for this basename: VITAMINB12:2,FOLATE:2,FERRITIN:2,TIBC:2,IRON:2,RETICCTPCT:2 in the last 72 hours Imaging results:  Dg Chest 2 View  04/21/2012  *RADIOLOGY REPORT*  Clinical Data: Fall, confusion, question infiltrate, history Parkinson's, bladder cancer  CHEST - 2 VIEW  Comparison: 09/30/2011  Findings: Enlargement of cardiac silhouette. Calcified tortuous aorta. Small hiatal hernia. Pulmonary vascularity normal. Thoracolumbar scoliosis and mild kyphosis. Minimal linear scarring versus subsegmental atelectasis at lingula. Lungs appear emphysematous but otherwise clear. No pleural effusion or pneumothorax. Osseous demineralization.  IMPRESSION: Enlargement of cardiac silhouette. Hiatal hernia. Minimal linear scarring versus subsegmental atelectasis at lingula.  Original Report Authenticated By: Lollie Marrow, M.D.   Ct Head Wo Contrast  04/21/2012  *RADIOLOGY REPORT*  Clinical Data: Altered mental status.  CT HEAD WITHOUT  CONTRAST  Technique:  Contiguous axial images were obtained from the base of the skull through the vertex without contrast.  Comparison: CT head 04/20/2012.  Findings: A right periorbital scalp hematoma is slightly decreased relative to the prior study.  The right maxillary sinus is opacified.  This does not appear related to the trauma.  The right globe and orbit is otherwise intact.  Moderate generalized atrophy is stable.  Mild diffuse white matter hypoattenuation is evident.  The ventricles are proportionate to the degree of atrophy.  There is no significant extra-axial fluid collection.  IMPRESSION:  1.  Slight decrease in size of a right periorbital hematoma. 2.  No acute intracranial abnormality or significant interval change. 2.  Stable atrophy and white matter disease.  Original Report Authenticated By: Jamesetta Orleans. MATTERN, M.D.   Ct Head Wo Contrast  04/20/2012  *RADIOLOGY REPORT*  Clinical Data: Fall, swelling and bruising  CT HEAD WITHOUT CONTRAST  Technique:  Contiguous axial images were obtained from the base of the skull through the vertex without contrast.  Comparison: 1/49/10  Findings: There is scalp swelling and subcutaneous stranding in the right frontal region.  A  subcutaneous hematoma in the right frontal region measures 2.8 x 1 cm.  No intracranial hemorrhage, mass effect or midline shift. No skull fracture is identified.  No acute infarction.  No mass lesion is noted on this unenhanced scan.  Stable cerebral atrophy.  There is mucosal thickening with almost complete opacification of the right maxillary sinus.  The mastoid air cells are unremarkable.  Atherosclerotic calcifications of carotid siphon are again noted.  Atherosclerotic calcifications of vertebral arteries.  Stable periventricular chronic white matter disease.  IMPRESSION: No acute intracranial abnormality.  There is soft tissue swelling and scalp hematoma in the right frontal region, hematoma measures 2.8 x 1 cm.  Stable  cerebral atrophy and chronic white matter disease. Basilar thickening with almost complete opacification of the right maxillary sinus.  Original Report Authenticated By: Natasha Mead, M.D.   Impression/Plan:  Principal Problem:  *Encephalopathy acute Active Problems:  Hypercalcemia  Renal failure (ARF), acute on chronic  Dehydration  HTN (hypertension)  Hyperlipidemia  DM (diabetes mellitus)  Parkinson's disease  CAD (coronary artery disease)  Barrett esophagus  PUD (peptic ulcer disease)  Anxiety  Fibromyalgia  Falls   #1 acute encephalopathy Likely secondary to hypercalcemia with a calcium level of 13. Patient is on oral calcium supplements and vitamin D. Per husband patient had a colonoscopy and a mammogram done however not sure of the actual results but states it was not abnormal. Patient does have altered mental status, constipation, dehydration. Will admit the patient to telemetry. CT of the head was negative. Chest x-ray which was done was negative. Will check an ionized calcium. Check an intact PTH. Check a phosphorus level, magnesium, vitamin D. levels, TSH, MRI of the head. Will hold patient's oral vitamin D supplements and oral calcium supplements. We'll place patient  on IV fluids. Will hold patient's Lasix. Will give patient IV pamidronate. And follow.  #2 hypercalcemia Questionable etiology. May be secondary to exogenous calcium supplementation. Per husband patient has had a colonoscopy and a mammogram done not sure exactly how far back however states that were not abnormal. Chest x-ray which was done was negative. CT of the head which was done was negative. Will check MRI of the head, check a TSH, check a phosphorus level, check a magnesium level, check ionized calcium, check an intact PTH. We'll place on IV fluids. Hold patient's Lasix. Given a dose of IV pamidronate.Follow.  #3 acute on chronic renal failure Patient's last creatinine from 03/28/2011 was 1.54. I suspect  patient does have a baseline chronic kidney disease. Patient's current creatinine is 3.50. Likely secondary to a prerenal azotemia secondary to dehydration, in the setting of diuretics. Will check a urine sodium and a urine creatinine. Will check a renal ultrasound. We'll place a Foley catheter. Hydrated with IV fluids and follow. Monitor closely for volume overload as patient does have an EF of less than 50%. Will hold patient's Lasix.  #4 Parkinson's disease Per family patient has had a worsening of her ataxia and increasing the tremors after her medications were changed by her neurologist. Will continue patient carbidopa and levodopa as well as her amantadine and Aricept. Will consult with neurology for further evaluation and recommendations.  #5 anxiety Continue home dose clonazepam.  #6 hyperlipidemia Check a fasting lipid panel.  #7 hypertension Patient's systolic blood pressure in the ED is elevated. Patient does not seem to be on any antihypertensive medications from her med rec list. Will place patient on low-dose Lopressor and titrate as tolerated.  #8 history of peptic ulcer disease We'll place on a PPI.  #9 type 2 diabetes Check a hemoglobin A1c. Place on sliding scale insulin.  #10 history of coronary artery disease Stable. Will place patient on a low-dose beta blocker. Continue home dose aspirin. Will hold patient's Lasix secondary to problem #1 and will resume once patient is euvolemic.  #11 prophylaxis PPI for GI prophylaxis, heparin for DVT prophylaxis.  #12 CODE STATUS DO NOT RESUSCITATE.   Leeandre Nordling [phone] p 04/21/2012, 4:02 PM

## 2012-04-21 NOTE — ED Provider Notes (Signed)
History     CSN: [id number]  Arrival date & time 04/21/12  0909   First MD Initiated Contact with Patient 04/21/12 0913       Chief Complaint  Patient presents with  . Altered Mental Status    (Consider location/radiation/quality/duration/timing/severity/associated sxs/prior treatment) Patient is a 78 y.o. female presenting with altered mental status. The history is provided by the patient. The history is limited by the condition of the patient.  Altered Mental Status Pertinent negatives include no abdominal pain, chest pain, headaches, neck pain or weakness.  78 y/o female with baseline dementia secondary to parkinson's presenting for change in mental status. Pt fell yesterday while in the restroom (No LOC) and was [facility] with negative head CT. Pt presents is more difficult to arouse than normal as per family. Pt denies pain, HA, NV. As per husband/caregiver, pt has been having worsening of condition x3 weeks (depression, memory, hallucinations, ataxia). Her neurologic (Dr. Sandria Manly) recently started Effexor and donepezil x6 days ago. Husband called 911 because she was difficult to rouse this AM with marked confusion from baseline. Although she has improved she is still not at baseline as per husband. Pt has fallen 5 times in the last week.   Past Medical History  Diagnosis Date  . MI, old   . Parkinson disease   . Scoliosis   . Arthritis   . Bladder cancer     Past Surgical History  Procedure Date  . Tonsillectomy   . Appendectomy   . Abdominal hysterectomy     No family history on file.  History  Substance Use Topics  . Smoking status: Never Smoker   . Smokeless tobacco: Not on file  . Alcohol Use: No    OB History    Grav Para Term Preterm Abortions TAB SAB Ect Mult Living                  Review of Systems  HENT: Negative for neck pain.   Eyes: Negative for visual disturbance.  Cardiovascular: Negative for chest pain.  Gastrointestinal: Negative for  abdominal pain.  Musculoskeletal: Negative for back pain.  Neurological: Negative for weakness and headaches.  Psychiatric/Behavioral: Positive for altered mental status.    Allergies  Codeine  Home Medications   Current Outpatient Rx  Name Route Sig Dispense Refill  . AMANTADINE HCL 100 MG PO CAPS Oral Take 100 mg by mouth 2 (two) times daily.    . ASPIRIN 325 MG PO TABS Oral Take 325 mg by mouth daily.    Marland Kitchen CALCIUM CARBONATE 1250 MG PO TABS Oral Take 1 tablet by mouth daily.    Marland Kitchen CARBIDOPA-LEVODOPA 10-100 MG PO TABS Oral Take 1 tablet by mouth 3 (three) times daily.    Marland Kitchen CLONAZEPAM 0.5 MG PO TABS Oral Take 0.5 mg by mouth at bedtime as needed.    . DONEPEZIL HCL 5 MG PO TABS Oral Take 5 mg by mouth at bedtime as needed.    . FUROSEMIDE 40 MG PO TABS Oral Take 40 mg by mouth daily.    Marland Kitchen MIRABEGRON ER 50 MG PO TB24 Oral Take by mouth daily.    Marland Kitchen OMEPRAZOLE 10 MG PO CPDR Oral Take 10 mg by mouth daily.    . TRAMADOL HCL 50 MG PO TABS Oral Take 50 mg by mouth every 6 (six) hours as needed.      BP 162/101  Pulse 56  Temp 97.7 F (36.5 C) (Oral)  Resp 13  SpO2 99%  Physical Exam  Nursing note and vitals reviewed. Constitutional: She appears well-developed and well-nourished. No distress.  HENT:  Head: Normocephalic.  Right Ear: External ear normal.  Left Ear: External ear normal.  Nose: Nose normal.  Mouth/Throat: Oropharynx is clear and moist.       Significant ecchymosis to right eye. EOMI intact   Eyes: Conjunctivae and EOM are normal. Pupils are equal, round, and reactive to light.  Neck: Normal range of motion. Neck supple.       No Step offs or midline tenderness  Cardiovascular: Normal rate, regular rhythm and normal heart sounds.   Pulmonary/Chest: Effort normal and breath sounds normal.  Abdominal: Soft. Bowel sounds are normal.  Musculoskeletal: Normal range of motion.  Neurological: She is alert.       CN III - XII intact. Pt oriented to self, place and  month, not day. Pt confused about if she came by ambulance or with husband  Skin: Skin is warm and dry.  Psychiatric: She has a normal mood and affect.    ED Course  Procedures (including critical care time)  Labs Reviewed  URINALYSIS, ROUTINE W REFLEX MICROSCOPIC - Abnormal; Notable for the following:    Leukocytes, UA TRACE (*)     All other components within normal limits  COMPREHENSIVE METABOLIC PANEL - Abnormal; Notable for the following:    BUN 60 (*)     Creatinine, Ser 3.50 (*)     Calcium 13.7 (*)     GFR calc non Af Amer 11 (*)     GFR calc Af Amer 13 (*)     All other components within normal limits  URINE MICROSCOPIC-ADD ON - Abnormal; Notable for the following:    Squamous Epithelial / LPF FEW (*)     All other components within normal limits  CBC   Dg Chest 2 View  04/21/2012  *RADIOLOGY REPORT*  Clinical Data: Fall, confusion, question infiltrate, history Parkinson's, bladder cancer  CHEST - 2 VIEW  Comparison: 09/30/2011  Findings: Enlargement of cardiac silhouette. Calcified tortuous aorta. Small hiatal hernia. Pulmonary vascularity normal. Thoracolumbar scoliosis and mild kyphosis. Minimal linear scarring versus subsegmental atelectasis at lingula. Lungs appear emphysematous but otherwise clear. No pleural effusion or pneumothorax. Osseous demineralization.  IMPRESSION: Enlargement of cardiac silhouette. Hiatal hernia. Minimal linear scarring versus subsegmental atelectasis at lingula.  Original Report Authenticated By: Lollie Marrow, M.D.   Ct Head Wo Contrast  04/21/2012  *RADIOLOGY REPORT*  Clinical Data: Altered mental status.  CT HEAD WITHOUT CONTRAST  Technique:  Contiguous axial images were obtained from the base of the skull through the vertex without contrast.  Comparison: CT head 04/20/2012.  Findings: A right periorbital scalp hematoma is slightly decreased relative to the prior study.  The right maxillary sinus is opacified.  This does not appear related to  the trauma.  The right globe and orbit is otherwise intact.  Moderate generalized atrophy is stable.  Mild diffuse white matter hypoattenuation is evident.  The ventricles are proportionate to the degree of atrophy.  There is no significant extra-axial fluid collection.  IMPRESSION:  1.  Slight decrease in size of a right periorbital hematoma. 2.  No acute intracranial abnormality or significant interval change. 2.  Stable atrophy and white matter disease.  Original Report Authenticated By: Jamesetta Orleans. MATTERN, M.D.   Ct Head Wo Contrast  04/20/2012  *RADIOLOGY REPORT*  Clinical Data: Fall, swelling and bruising  CT HEAD WITHOUT CONTRAST  Technique:  Contiguous  axial images were obtained from the base of the skull through the vertex without contrast.  Comparison: 1/49/10  Findings: There is scalp swelling and subcutaneous stranding in the right frontal region.  A  subcutaneous hematoma in the right frontal region measures 2.8 x 1 cm.  No intracranial hemorrhage, mass effect or midline shift. No skull fracture is identified.  No acute infarction.  No mass lesion is noted on this unenhanced scan.  Stable cerebral atrophy.  There is mucosal thickening with almost complete opacification of the right maxillary sinus.  The mastoid air cells are unremarkable.  Atherosclerotic calcifications of carotid siphon are again noted.  Atherosclerotic calcifications of vertebral arteries.  Stable periventricular chronic white matter disease.  IMPRESSION: No acute intracranial abnormality.  There is soft tissue swelling and scalp hematoma in the right frontal region, hematoma measures 2.8 x 1 cm.  Stable cerebral atrophy and chronic white matter disease. Basilar thickening with almost complete opacification of the right maxillary sinus.  Original Report Authenticated By: Natasha Mead, M.D.     1. Hypercalcemia   2. Renal insufficiency      Date: 04/21/2012  Rate: 68  Rhythm: normal sinus rhythm  QRS Axis: normal   Intervals: normal  ST/T Wave abnormalities: normal  Conduction Disutrbances:nonspecific intraventricular conduction delay  Narrative Interpretation:   Old EKG Reviewed: none available and unchanged   MDM  78 y/o female with parkinson's presenting for reduced function from baseline. Pt has head trauma yesterday with negative CT. Neuro exam is normal and today's CT unremarkable. BUN 60 and Cr 3.5. liter bolus given will recheck Ca. As per husband, Pt has been taking Ca2+ supplements. Pt had Cr of 1.5 in 12/2010.  Pt will be admitted to hospitalist under [facility] service for renal failure and hypercalcemia. Pt will be admitted to tele under team 4 Dr. Charlesetta Shanks.        Wynetta Emery, PA-C 04/21/12 1305  Avea Mcgowen, PA-C 04/21/12 1328

## 2012-04-21 NOTE — ED Notes (Signed)
Report called to Luz,RN on 5700.  Pt to go to 5715.

## 2012-04-21 NOTE — ED Provider Notes (Signed)
Medical screening examination/treatment/procedure(s) were conducted as a shared visit with non-physician practitioner(s) and myself.  I personally evaluated the patient during the encounter  Kathleen Likins, MD 04/21/12 1540 

## 2012-04-21 NOTE — ED Notes (Signed)
Admitting MD, Janee Morn, at the bedside.  MD, Janee Morn informed that patient's blood glucose is 69mg /dl.

## 2012-04-21 NOTE — ED Notes (Signed)
Attending MD, Janee Morn of admitting team aware of current blood pressure.  Medication requested from pharmacy due to not carried in ED

## 2012-04-21 NOTE — ED Notes (Signed)
Patient with alterd mental status and full body tremors.  Patient with facial grimaces.  Patient ltheragic but able to be aroused.  Patient able to recognize family members upon awakening.  Patient in and out of sleep.  GCS=11.  Admitting team paged.

## 2012-04-21 NOTE — Consult Note (Addendum)
[facility]  Consult Note    Date: 04/21/2012         Patient: [name]  MRN: [id number]   DOB: [birth year]  Age/ Sex: 78 y.o., female   PCP: Allean Found, MD     Referring physician: Dr. Janee Morn ([facility])  Reason for consult: Parkinson Disease     Chief Complaint: altered mental status.  History of Present Illness: Patient is a 78 y.o. female with a PMHx of Parkison disease (followed as an outpatient by Dr. Sandria Manly), DMII (last A1c 6.3 in 2010), nonobstructive CAD, adrenal insufficiency, who presents to [facility] for evaluation of 3 week history of increased tremors, worsening ataxia with intermittent falls and worsening confusion that has been acutely worst over the last 6 days. On the morning of admission, the patient's husband found her to be essentially unarousable, and subsequently called 911. During the ED course, routine labs revealed elevated Ca level to 13.7, which is thought likely to be the cause of worsening mental status, therefore, internal medicine team has been contacted to admit.   Neurology team is consulted to aide in management of patient's baseline severe Parkinson disease. In regards to her PD, the patient was diagnosed approximately 11 years ago. Since that time, she has been tried on various medications including Mirapex, Sinemet, and Selegiline and was previously well maintained on this regimen. She has experienced gradual decline of her motor skills over the last 1 year - specifically with increasing rigidity, difficulty with ambulation, and difficulty with feeding herself as a result. This has progressively worsened over last 3-4 weeks. At baseline, the patient requires help with some ADLs including cooking/ cleaning/ help with dressing, but is able to bathe independently. She uses a walker at baseline.  Last week, the patient was seen by her primary neurologist , at which time she and family complained of worsening depression symptoms (feeling  low all of the time), increase in hallucinations. In response to these complaints, the patient was discontinued of her Selegiline and started on Effexor and Aricept. The family feels that her symptoms have acutely worsened since starting the Effexor and Aricept / stopping the Selegiline. She has experienced increased falls since last week, approximately 4-5 episodes and has sustained various injuries as a result.    Review of Systems: Constitutional:  denies fever, chills, diaphoresis, appetite change and fatigue.  Respiratory: denies SOB, DOE, cough, chest tightness, and wheezing.  Cardiovascular: denies chest pain, palpitations and leg swelling  Gastrointestinal: denies nausea, vomiting, abdominal pain, diarrhea, constipation, blood in stool.  Genitourinary: denies dysuria, urgency, frequency, hematuria, flank pain and difficulty urinating.    Medications: Outpatient medications: Medication Sig  . amantadine (SYMMETREL) 100 MG capsule Take 100 mg by mouth daily.   Marland Kitchen aspirin 325 MG tablet Take 325 mg by mouth daily.  Marland Kitchen CALCIUM-VITAMIN D PO Take 1 tablet by mouth 2 (two) times daily.  . carbidopa-levodopa (SINEMET CR) 25-100 MG per tablet Take 0.5-1 tablets by mouth 2 (two) times daily. 1 tablet in the morning 0.5 tablet in the evening  . Cholecalciferol (VITAMIN D3) 2000 UNITS TABS Take 1 tablet by mouth 2 (two) times daily.  . clonazePAM (KLONOPIN) 0.5 MG tablet Take 0.5 mg by mouth at bedtime.   . donepezil (ARICEPT) 5 MG tablet Take 5 mg by mouth daily.   . furosemide (LASIX) 40 MG tablet Take 40 mg by mouth daily.  . mirabegron ER (MYRBETRIQ) 50 MG TB24 Take by mouth daily.  . Multiple Vitamins-Minerals (OCUVITE  PO) Take 1 tablet by mouth every evening.  Marland Kitchen omega-3 acid ethyl esters (LOVAZA) 1 G capsule Take 2 g by mouth every evening.  Marland Kitchen OMEPRAZOLE PO Take 1 capsule by mouth daily.  . Pramipexole Dihydrochloride (MIRAPEX ER) 0.375 MG TB24 Take 1 tablet by mouth every morning.  .  traMADol (ULTRAM) 50 MG tablet Take 50 mg by mouth daily as needed. For pain    Current medications: Medication Dose Route Frequency  . 0.9 %  sodium chloride infusion   Intravenous Continuous  . hydrALAZINE (APRESOLINE) injection 10 mg  10 mg Intravenous Q6H PRN  . insulin aspart (novoLOG) injection 0-9 Units  0-9 Units Subcutaneous TID WC  . LORazepam (ATIVAN) injection 0.5 mg  0.5 mg Intravenous Once  . metoprolol tartrate (LOPRESSOR) tablet 12.5 mg  12.5 mg Oral BID  . pamidronate (AREDIA) 60 mg in sodium chloride 0.9 % 500 mL IVPB  60 mg Intravenous Once  . pantoprazole (PROTONIX) EC tablet 40 mg  40 mg Oral Q0600  . sodium chloride 0.9 % bolus 1,000 mL  1,000 mL Intravenous Once     Allergies: Allergen Reactions  . Codeine Hives     Past Medical History: Past Medical History  Diagnosis Date  . MI, old   . Parkinson disease   . Scoliosis   . Arthritis   . Bladder cancer   . HTN (hypertension) 04/21/2012  . Hyperlipidemia 04/21/2012  . DM (diabetes mellitus) 04/21/2012  . Parkinson's disease 04/21/2012  . CAD (coronary artery disease) 04/21/2012  . Barrett esophagus 04/21/2012  . PUD (peptic ulcer disease) 04/21/2012  . Adrenal insufficiency 04/21/2012  . Raynaud's disease 04/21/2012  . Anxiety 04/21/2012  . Fibromyalgia 04/21/2012  . Hx of bladder cancer 04/21/2012  . Edema of lower extremity 04/21/2012    Chronic lower extremity edema  . Falls 04/21/2012    Past Surgical History: Past Surgical History  Procedure Date  . Tonsillectomy   . Appendectomy   . Abdominal hysterectomy     Family History: History reviewed. No pertinent family history.   Social History:  reports that she has never smoked. She does not have any smokeless tobacco history on file. She reports that she does not drink alcohol or use illicit drugs.   Vital Signs: Blood pressure 181/96, pulse 62, temperature 97.7 F (36.5 C), temperature source Oral, resp. rate 17, SpO2 93.00%.   Physical  Exam:  General Exam:   Head: Normocephalic, large area of ecchymosis around right eye.  Eyes: No signs of anemia or jaundince.  Nose: Mucous membranes moist, not inflammed, nonerythematous.  Throat: Oropharynx nonerythematous, dry. No exudate appreciated.   Neck: No deformities, masses, or tenderness noted.Supple, No carotid Bruits, no JVD.  Lungs:  Normal respiratory effort. Clear to auscultation BL without crackles or wheezes.  Heart: RRR. S1 and S2 normal without gallop, murmur, or rubs.  Abdomen:  BS normoactive. Soft, Nondistended, non-tender.  No masses or organomegaly.  Extremities: No pretibial edema.    Neurologic Exam:   Mental Status: Not alert or oriented to person, place, or time.  Unable to follow 3 step commands.  Cranial Nerves:   II: Unable to be assessed secondary to encephalopathy.  III/IV/VI: Extraocular movements intact.  Pupils reactive bilaterally.  V/VII: Smile symmetric.   VIII: Grossly intact.  XI: Unable to be assessed secondary to encephalopathy.  XII: Unable to be assessed secondary to encephalopathy.  Motor:  3+/5 bilateral upper and lower extremities.  Sensory:  Pinprick and light touch intact  throughout, bilaterally  DTRs: 2+ and symmetric throughout  Plantars:  Upgoing bilaterally  Cerebellar: Unable to be assessed secondary to encephalopathy.    Lab results: Basic Metabolic Panel:  Lab 04/21/12 1308  NA 140  K 3.6  CL 98  CO2 27  GLUCOSE 80  BUN 60*  CREATININE 3.50*  CALCIUM 13.7*  MG --  PHOS --    Liver Function Tests:  Lab 04/21/12 0941  AST 32  ALT 6  ALKPHOS 92  BILITOT 0.4  PROT 7.4  ALBUMIN 3.9    CBC:  Lab 04/21/12 0941  WBC 5.9  NEUTROABS --  HGB 12.1  HCT 36.8  MCV 85.2  PLT 294    Urinalysis:  Basename 04/21/12 0928  COLORURINE YELLOW  LABSPEC 1.015  PHURINE 6.5  GLUCOSEU NEGATIVE  HGBUR NEGATIVE  BILIRUBINUR NEGATIVE  KETONESUR NEGATIVE  PROTEINUR NEGATIVE  UROBILINOGEN 0.2  NITRITE  NEGATIVE  LEUKOCYTESUR TRACE*     Imaging: Dg Chest 2 View (04/21/2012) - Enlargement of cardiac silhouette. Hiatal hernia. Minimal linear scarring versus subsegmental atelectasis at lingula.  Original Report Authenticated By: Lollie Marrow, M.D.  Ct Head Wo Contrast (04/21/2012) - 1.  Slight decrease in size of a right periorbital hematoma. 2.  No acute intracranial abnormality or significant interval change. 2.  Stable atrophy and white matter disease.  Original Report Authenticated By: Jamesetta Orleans. MATTERN, M.D.  Ct Head Wo Contrast (04/20/2012) - No acute intracranial abnormality.  There is soft tissue swelling and scalp hematoma in the right frontal region, hematoma measures 2.8 x 1 cm.  Stable cerebral atrophy and chronic white matter disease. Basilar thickening with almost complete opacification of the right maxillary sinus.  Original Report Authenticated By: Natasha Mead, M.D.    Assessment & Plan: Pt is a 77 y.o. yo female with a PMHX of severe Parkinson Disease, nonobstructive CAD, DMII, and adrenal insufficiency (not on current home steroid therapy), was admitted to [facility] on 04/21/2012 with 6 day history of progressively worsening confusion, motor rigidity, and frequent falls and encephalopathic state on the day of admission. On admission, patient was found to be significantly hypercalcemic, for which medicine service will manage. Neurology service is consulted to evaluate progressive Parkinson disease over last year, and to provide recommendation regarding appropriate Parkinson medications.  1) Acute encephalopathy -  likely secondary to hypercalcemia and acute on chronic renal failure. Primary medical service is treating with IV fluids and pamidronate.  Per primary service.  Pending MRI brain.  Pending TSH, vitamin D, iCa, phosphorus, magnesium levels.  Would recommend NPO status until mental status clears for risk of aspiration.  2) Parkinson disease - the patient has had progressive  decline over the past year (progressive motor rigidity leading to frequent falls, inability to feed herself, and limited her capacity to perform ADLs and IADLs). This likely reflects progression of her Parkinson disease. As an outpatient, the patient was treated with Sinemet (levodopa-carbadopa), Mirapex (dopamine agonist), Amantadine (NMDA antagonist) and previously was on selegiline (MAO inhibitor - which was discontinued approximately one week ago secondary to concern for interaction with her newly started Effexor and Aricept).  Plan:  Continue current home medications including Mirapex, Sinemet, Amantadine  Would discontinue Aricept.  Would not resume Selegiline currently because do not want to obscure / confuse symptoms and findings of her acute encephalopathic state.  If symptoms of hallucinations/ confusion continue after acute issues resolve, can consider to discontinue Amantadine (likely as guided by her primary neurologist) given that in the elderly,  use of NMDA antagonist + DA agonist can potentiate side effects. However, again, these are long term medications and likely are not acute cause of her presenting symptoms.  PT / OT consult for evaluation of home health services - patient progressively worsening motor rigidity with frequent falls.    Patient history and plan of care reviewed with neurology attending, Dr. Thana Farr.  Signed: Johnette Abraham, D.ODonnajean Lopes, Internal Medicine Resident Pager: [phone] (7AM-5PM) 04/21/2012, 4:26 PM    Patient seen and examined. Diagnosis and management discussed.  I agree with the above.  Thana Farr, MD [facility] [phone]  04/21/2012  8:22 PM

## 2012-04-21 NOTE — ED Notes (Signed)
MRI called to report pt not able to hold still for MRI. MD paged orders obtained for ativan 0.5mg  IV now and repeat x1 if needed.

## 2012-04-21 NOTE — ED Notes (Signed)
Patient transported from MRI 

## 2012-04-21 NOTE — Progress Notes (Signed)
Received patient from ED,patient was brought by husband and daughter because husband could not get to wake patient up this a.m.Patient fell yesterday at home,right eye has a big periorbital bruise and swollen shut, right forehead is also bruised.Small abrasion noted on patient's lower back.Foley catheter intact.Placed patient on telemetry.Placed on falls precaution,moved to room 6705,bed alarm turned on.Will continue to monitor. Dierks Wach Joselita,RN

## 2012-04-21 NOTE — ED Notes (Signed)
[facility], pt family today reports aloc and difficult arouse.pt is not answering appropriately but is alert. Pt family sts that she has had recent changes in medications that could contribute to altered mental status.

## 2012-04-21 NOTE — ED Notes (Signed)
No family seen at present, pt now returned from ct  And palced back on monitor.

## 2012-04-21 NOTE — ED Notes (Signed)
Report received from [place], [place].  Patient still in MRI.  Family is at the bedside. Unable to assess the patient due patient being off the floor.

## 2012-04-21 NOTE — ED Notes (Signed)
Called to MRI due to pt unable to hold for test. Ativan 0.5mg  IV repeated and admitting MD notified.

## 2012-04-21 NOTE — ED Provider Notes (Signed)
Medical screening examination/treatment/procedure(s) were conducted as a shared visit with non-physician practitioner(s) and myself.  I personally evaluated the patient during the encounter Pt has dementia.  Lives in [place].  Fell in bathroom yest. Seen at [facility] ED.   Had ct of head. No injury.  Today. nh staff thought more confused.  Sent her here. Pt is oriented now.  She can describe in jury yest and denies recurrent fall. On pe has contusion to right forehead with dark periorbital ecchymosis on right.  No peripheral neuro deficits.  Will rescan head and check labs.  + incr. Calcium.  Need to admit for tx.   Will give ivf and lasix. Will let medicine decide on calcitonin or biphosphonates.  Cheri Guppy, MD 04/21/12 1325

## 2012-04-22 DIAGNOSIS — G20 Parkinson's disease: Secondary | ICD-10-CM

## 2012-04-22 DIAGNOSIS — F29 Unspecified psychosis not due to a substance or known physiological condition: Secondary | ICD-10-CM

## 2012-04-22 DIAGNOSIS — E876 Hypokalemia: Secondary | ICD-10-CM | POA: Insufficient documentation

## 2012-04-22 DIAGNOSIS — N179 Acute kidney failure, unspecified: Secondary | ICD-10-CM

## 2012-04-22 DIAGNOSIS — G9340 Encephalopathy, unspecified: Secondary | ICD-10-CM

## 2012-04-22 LAB — COMPREHENSIVE METABOLIC PANEL
ALT: 11 U/L (ref 0–35)
AST: 25 U/L (ref 0–37)
Albumin: 3.4 g/dL — ABNORMAL LOW (ref 3.5–5.2)
Calcium: 11.2 mg/dL — ABNORMAL HIGH (ref 8.4–10.5)
Chloride: 108 mEq/L (ref 96–112)
Creatinine, Ser: 2.97 mg/dL — ABNORMAL HIGH (ref 0.50–1.10)
Sodium: 146 mEq/L — ABNORMAL HIGH (ref 135–145)

## 2012-04-22 LAB — HEMOGLOBIN A1C: Hgb A1c MFr Bld: 6.3 % — ABNORMAL HIGH (ref <5.7)

## 2012-04-22 LAB — CBC
Hemoglobin: 11.4 g/dL — ABNORMAL LOW (ref 12.0–15.0)
MCH: 28.4 pg (ref 26.0–34.0)
MCV: 85.8 fL (ref 78.0–100.0)
RBC: 4.01 MIL/uL (ref 3.87–5.11)

## 2012-04-22 LAB — LIPID PANEL
HDL: 69 mg/dL (ref >39)
LDL Cholesterol: 74 mg/dL (ref 0–99)
Total CHOL/HDL Ratio: 2.3 RATIO
VLDL: 13 mg/dL (ref 0–40)

## 2012-04-22 LAB — GLUCOSE, CAPILLARY: Glucose-Capillary: 61 mg/dL — ABNORMAL LOW (ref 70–99)

## 2012-04-22 LAB — VITAMIN D 25 HYDROXY (VIT D DEFICIENCY, FRACTURES): Vit D, 25-Hydroxy: 61 ng/mL (ref 30–89)

## 2012-04-22 MED ORDER — POLYETHYLENE GLYCOL 3350 17 G PO PACK
17.0000 g | PACK | Freq: Every day | ORAL | Status: DC
  Administered 2012-04-22 – 2012-04-25 (×3): 17 g via ORAL
  Filled 2012-04-22 (×6): qty 1

## 2012-04-22 MED ORDER — METOPROLOL TARTRATE 12.5 MG HALF TABLET
12.5000 mg | ORAL_TABLET | Freq: Two times a day (BID) | ORAL | Status: DC
  Administered 2012-04-22 – 2012-04-23 (×4): 12.5 mg via ORAL
  Filled 2012-04-22 (×6): qty 1

## 2012-04-22 MED ORDER — METOPROLOL TARTRATE 25 MG PO TABS: 25.0000 mg | ORAL_TABLET | Freq: Two times a day (BID) | ORAL | Status: DC

## 2012-04-22 MED ORDER — ENSURE COMPLETE PO LIQD
237.0000 mL | Freq: Every day | ORAL | Status: DC
  Administered 2012-04-22 – 2012-04-27 (×6): 237 mL via ORAL

## 2012-04-22 MED ORDER — POTASSIUM CHLORIDE CRYS ER 20 MEQ PO TBCR
40.0000 meq | EXTENDED_RELEASE_TABLET | Freq: Once | ORAL | Status: AC
  Administered 2012-04-22: 40 meq via ORAL
  Filled 2012-04-22: qty 2

## 2012-04-22 MED ORDER — DEXTROSE-NACL 5-0.45 % IV SOLN
INTRAVENOUS | Status: DC
  Administered 2012-04-22: 1000 mL via INTRAVENOUS
  Administered 2012-04-22 – 2012-04-23 (×2): via INTRAVENOUS

## 2012-04-22 NOTE — Progress Notes (Signed)
Subjective: Patient is more alert. Patient answering some questions appropriately. Per husband patient going in and out of confusion. And not yet at baseline.  Objective: Vital signs in last 24 hours: Filed Vitals:   04/21/12 2005 04/21/12 2022 04/22/12 0500 04/22/12 0950  BP: 143/84  188/74 176/81  Pulse: 83  66 61  Temp: 97.6 F (36.4 C)  97.4 F (36.3 C) 98 F (36.7 C)  TempSrc: Oral  Oral Oral  Resp: 19  18 20   Height:  4\' 6"  (1.372 m)    Weight: 49.3 kg (108 lb 11 oz)     SpO2: 99%  96% 92%    Intake/Output Summary (Last 24 hours) at 04/22/12 1057 Last data filed at 04/22/12 1025  Gross per 24 hour  Intake    625 ml  Output    625 ml  Net      0 ml    Weight change:   General: Alert, awake, oriented x3, in no acute distress. HEENT: No bruits, no goiter. Right ecchymotic area around the eye. Heart: Regular rate and rhythm, without murmurs, rubs, gallops. Lungs: Clear to auscultation bilaterally. Abdomen: Soft, nontender, nondistended, positive bowel sounds. Extremities: No clubbing cyanosis or edema with positive pedal pulses. Neuro: Grossly intact, nonfocal.    Lab Results:  Basename 04/22/12 0520 04/21/12 2233 04/21/12 0941  NA 146* -- 140  K 3.3* -- 3.6  CL 108 -- 98  CO2 23 -- 27  GLUCOSE 60* -- 80  BUN 54* -- 60*  CREATININE 2.97* 3.02* --  CALCIUM 11.2* -- 13.7*  MG -- 2.2 --  PHOS -- 4.0 --    Basename 04/22/12 0520 04/21/12 0941  AST 25 32  ALT 11 6  ALKPHOS 82 92  BILITOT 0.5 0.4  PROT 6.4 7.4  ALBUMIN 3.4* 3.9   No results found for this basename: LIPASE:2,AMYLASE:2 in the last 72 hours  Basename 04/22/12 0520 04/21/12 2233  WBC 8.7 6.3  NEUTROABS -- --  HGB 11.4* 11.9*  HCT 34.4* 36.2  MCV 85.8 84.4  PLT 293 276   No results found for this basename: CKTOTAL:3,CKMB:3,CKMBINDEX:3,TROPONINI:3 in the last 72 hours No components found with this basename: POCBNP:3 No results found for this basename: DDIMER:2 in the last 72  hours  Basename 04/21/12 1506  HGBA1C 6.4*    Basename 04/22/12 0520  CHOL 156  HDL 69  LDLCALC 74  TRIG 64  CHOLHDL 2.3  LDLDIRECT --   No results found for this basename: TSH,T4TOTAL,FREET3,T3FREE,THYROIDAB in the last 72 hours No results found for this basename: VITAMINB12:2,FOLATE:2,FERRITIN:2,TIBC:2,IRON:2,RETICCTPCT:2 in the last 72 hours  Micro Results: No results found for this or any previous visit (from the past 240 hour(s)).  Studies/Results: Dg Chest 2 View  04/21/2012  *RADIOLOGY REPORT*  Clinical Data: Fall, confusion, question infiltrate, history Parkinson's, bladder cancer  CHEST - 2 VIEW  Comparison: 09/30/2011  Findings: Enlargement of cardiac silhouette. Calcified tortuous aorta. Small hiatal hernia. Pulmonary vascularity normal. Thoracolumbar scoliosis and mild kyphosis. Minimal linear scarring versus subsegmental atelectasis at lingula. Lungs appear emphysematous but otherwise clear. No pleural effusion or pneumothorax. Osseous demineralization.  IMPRESSION: Enlargement of cardiac silhouette. Hiatal hernia. Minimal linear scarring versus subsegmental atelectasis at lingula.  Original Report Authenticated By: Lollie Marrow, M.D.   Ct Head Wo Contrast  04/21/2012  *RADIOLOGY REPORT*  Clinical Data: Altered mental status.  CT HEAD WITHOUT CONTRAST  Technique:  Contiguous axial images were obtained from the base of the skull through the vertex without contrast.  Comparison: CT head 04/20/2012.  Findings: A right periorbital scalp hematoma is slightly decreased relative to the prior study.  The right maxillary sinus is opacified.  This does not appear related to the trauma.  The right globe and orbit is otherwise intact.  Moderate generalized atrophy is stable.  Mild diffuse white matter hypoattenuation is evident.  The ventricles are proportionate to the degree of atrophy.  There is no significant extra-axial fluid collection.  IMPRESSION:  1.  Slight decrease in size of a  right periorbital hematoma. 2.  No acute intracranial abnormality or significant interval change. 2.  Stable atrophy and white matter disease.  Original Report Authenticated By: Jamesetta Orleans. MATTERN, M.D.   Ct Head Wo Contrast  04/20/2012  *RADIOLOGY REPORT*  Clinical Data: Fall, swelling and bruising  CT HEAD WITHOUT CONTRAST  Technique:  Contiguous axial images were obtained from the base of the skull through the vertex without contrast.  Comparison: 1/49/10  Findings: There is scalp swelling and subcutaneous stranding in the right frontal region.  A  subcutaneous hematoma in the right frontal region measures 2.8 x 1 cm.  No intracranial hemorrhage, mass effect or midline shift. No skull fracture is identified.  No acute infarction.  No mass lesion is noted on this unenhanced scan.  Stable cerebral atrophy.  There is mucosal thickening with almost complete opacification of the right maxillary sinus.  The mastoid air cells are unremarkable.  Atherosclerotic calcifications of carotid siphon are again noted.  Atherosclerotic calcifications of vertebral arteries.  Stable periventricular chronic white matter disease.  IMPRESSION: No acute intracranial abnormality.  There is soft tissue swelling and scalp hematoma in the right frontal region, hematoma measures 2.8 x 1 cm.  Stable cerebral atrophy and chronic white matter disease. Basilar thickening with almost complete opacification of the right maxillary sinus.  Original Report Authenticated By: Natasha Mead, M.D.   US Renal  04/22/2012  *RADIOLOGY REPORT*  Clinical Data: Hypertension, acute renal failure.  RENAL/URINARY TRACT ULTRASOUND COMPLETE  Comparison:  05/28/2011 CT  Findings:  Right Kidney:  Measures 11.6 cm.  Bifid renal pelvis and/or partial duplication.  No hydronephrosis.  No focal abnormality.  Left Kidney:  Measures 9.3 cm.  Poor acoustic windows limits visualization. No hydronephrosis.  Question heterogeneous/increased echogenicity.This can be seen  with medical renal disease, however typically that would be bilateral.  Bladder:  Partially decompressed, with a Foley catheter balloon in place.  Prevoid volume of 78 ml.  IMPRESSION: No hydronephrosis.  Original Report Authenticated By: Waneta Martins, M.D.   Mr Brain Ltd W/o Cm  04/21/2012  *RADIOLOGY REPORT*  Clinical Data: Altered mental status  MRI HEAD WITHOUT CONTRAST  Technique:  Multiplanar, multiecho pulse sequences of the brain and surrounding structures were obtained according to standard protocol without intravenous contrast.  Comparison: CT 04/21/2012  Findings: Incomplete study.  The patient was moving throughout the study  and was not able to complete the study.  Axial diffusion and FLAIR and sagittal T1 imaging was performed.  Negative for acute infarct.  There is generalized atrophy.  No significant chronic ischemic change.  Right maxillary sinus is filled with secretions.  There is a right frontal scalp hematoma, unchanged from the  prior study.  IMPRESSION: Incomplete study.  The patient was not able to cooperate for the examination.  No acute infarct is identified.  Original Report Authenticated By: Camelia Phenes, M.D.    Medications:     . sodium chloride   Intravenous STAT  .  amantadine  100 mg Oral Daily  . aspirin  325 mg Oral Daily  . beta carotene w/minerals  1 tablet Oral Daily  . carbidopa-levodopa  1 tablet Oral q morning - 10a  . clonazePAM  0.5 mg Oral QHS  . dextrose  1 ampule Intravenous Once  . docusate sodium  100 mg Oral BID  . heparin  5,000 Units Subcutaneous Q8H  . insulin aspart  0-9 Units Subcutaneous TID WC  . LORazepam  0.5 mg Intravenous Once  . metoprolol tartrate  12.5 mg Oral BID  . mirabegron ER  50 mg Oral Daily  . NONFORMULARY OR COMPOUNDED ITEM 0.5 each  0.5 each Oral QHS  . omega-3 acid ethyl esters  2 g Oral QHS  . pamidronate  60 mg Intravenous Once  . pantoprazole  40 mg Oral Q0600  . potassium chloride  40 mEq Oral Once  .  Pramipexole Dihydrochloride  1 tablet Oral Daily  . sodium chloride  1,000 mL Intravenous Once  . sodium chloride  3 mL Intravenous Q12H  . DISCONTD: carbidopa-levodopa  1 tablet Oral BID  . DISCONTD: donepezil  5 mg Oral Daily  . DISCONTD: metoprolol tartrate  12.5 mg Oral BID  . DISCONTD: metoprolol tartrate  25 mg Oral BID  . DISCONTD: NONFORMULARY OR COMPOUNDED ITEM 0.5 each  0.5 each Oral QHS    Assessment: Principal Problem:  *Encephalopathy acute Active Problems:  Hypercalcemia  Renal failure (ARF), acute on chronic  Dehydration  HTN (hypertension)  Hyperlipidemia  DM (diabetes mellitus)  Parkinson's disease  CAD (coronary artery disease)  Barrett esophagus  PUD (peptic ulcer disease)  Anxiety  Fibromyalgia  Falls  Hypokalemia   Plan: #1 acute encephalopathy  Likely secondary to hypercalcemia with a calcium level of 13. Patient is on oral calcium supplements and vitamin D. Per husband patient had a colonoscopy and a mammogram done however not sure of the actual results but states it was not abnormal. Patient does have altered mental status, constipation, dehydration. CT of the head was negative. Chest x-ray which was done was negative. MRI was incomplete however no acute findings. Ionized calcium pending. intact PTH pending. Phosphorus level within normal limits, magnesium within normal limits, vitamin D. levels pending, TSH pending. Slowly improving clinically. Corrected calcium level today is 11.7. Continue to hold patient's oral vitamin D supplements and oral calcium supplements. Decrease IV fluids to 75 cc per hour. Will hold patient's Lasix. S/P IV pamidronate. Follow. Neural following and appreciate input and recommendations. #2 hypercalcemia  Questionable etiology. May be secondary to exogenous calcium supplementation. Per husband patient has had a colonoscopy and a mammogram done not sure exactly how far back however states that were not abnormal. Chest x-ray which  was done was negative. CT of the head which was done was negative. MRI of the head negative, TSH pending,  phosphorus level within normal limits,  magnesium level within normal limits,  ionized calcium pending,  intact PTH pending. Corrected calcium level today is 11.7 from 13. Slowly improving clinically but not yet at baseline.continue IV fluids at 75 cc per hour. Holding patient's Lasix. Status post IV pamidronate.Follow.  #3 acute on chronic renal failure  Patient's last creatinine from 03/28/2011 was 1.54. I suspect patient does have a baseline chronic kidney disease. Patient's creatinine on admission was 3.50. Likely secondary to a prerenal azotemia secondary to dehydration, in the setting of diuretics. Renal function slowly improving and creatinine today at 2.97. Will check a urine sodium and a  urine creatinine. Renal ultrasound negative for hydronephrosis. Continue Foley catheter. Decrease IV fluids to 75 cc per hour and follow. Monitor closely for volume overload as patient does have an EF of less than 50%. Continue to hold patient's Lasix.  #4 Parkinson's disease  Per family patient has had a worsening of her ataxia and increasing the tremors after her medications were changed by her neurologist. Will continue patient carbidopa and levodopa as well as her amantadine, and Mirapex. Aricept was discontinued per neurology. PT OT. Neurology following and appreciate input and recommendations #5 anxiety  Continue home dose clonazepam.  #6 hyperlipidemia  LDL is 74. #7 hypertension  Patient's systolic blood pressure in the ED is elevated. Patient does not seem to be on any antihypertensive medications from her med rec list. Continue low-dose Lopressor and titrate as tolerated.  #8 history of peptic ulcer disease  PPI.  #9 well controlled type 2 diabetes  Hemoglobin A1c = 6.4. Continue sliding scale insulin.  #10 history of coronary artery disease  Stable. Continue low-dose beta blocker. Continue  home dose aspirin. Will hold patient's Lasix secondary to problem #1 and will resume once patient is euvolemic. #11 hypokalemia Replete. # 12 falls Likely secondary to deteriorating Parkinson's disease. Continue current regimen. PT /OT.  #13 prophylaxis  PPI for GI prophylaxis, heparin for DVT prophylaxis.  #14 CODE STATUS  DO NOT RESUSCITATE.    LOS: 1 day   Tauna Macfarlane [phone] 04/22/2012, 10:57 AM

## 2012-04-22 NOTE — Progress Notes (Addendum)
INITIAL ADULT NUTRITION ASSESSMENT Date: 04/22/2012   Time: 3:57 PM Reason for Assessment: low braden  ASSESSMENT: Female 78 y.o.  Dx: Encephalopathy acute  Hx:  Past Medical History  Diagnosis Date  . MI, old   . Parkinson disease   . Scoliosis   . Arthritis   . Bladder cancer   . HTN (hypertension) 04/21/2012  . Hyperlipidemia 04/21/2012  . DM (diabetes mellitus) 04/21/2012  . CAD (coronary artery disease) 04/21/2012  . Barrett esophagus 04/21/2012  . PUD (peptic ulcer disease) 04/21/2012  . Adrenal insufficiency 04/21/2012  . Raynaud's disease 04/21/2012  . Anxiety 04/21/2012  . Fibromyalgia 04/21/2012  . Hx of bladder cancer 04/21/2012  . Edema of lower extremity 04/21/2012    Chronic lower extremity edema  . Falls 04/21/2012   Past Surgical History  Procedure Date  . Tonsillectomy   . Appendectomy   . Abdominal hysterectomy     Related Meds:  Scheduled Meds:   . sodium chloride   Intravenous STAT  . amantadine  100 mg Oral Daily  . aspirin  325 mg Oral Daily  . beta carotene w/minerals  1 tablet Oral Daily  . carbidopa-levodopa  1 tablet Oral q morning - 10a  . clonazePAM  0.5 mg Oral QHS  . dextrose  1 ampule Intravenous Once  . docusate sodium  100 mg Oral BID  . heparin  5,000 Units Subcutaneous Q8H  . insulin aspart  0-9 Units Subcutaneous TID WC  . LORazepam  0.5 mg Intravenous Once  . metoprolol tartrate  12.5 mg Oral BID  . mirabegron ER  50 mg Oral Daily  . NONFORMULARY OR COMPOUNDED ITEM 0.5 each  0.5 each Oral QHS  . omega-3 acid ethyl esters  2 g Oral QHS  . pamidronate  60 mg Intravenous Once  . pantoprazole  40 mg Oral Q0600  . potassium chloride  40 mEq Oral Once  . sodium chloride  3 mL Intravenous Q12H  . DISCONTD: carbidopa-levodopa  1 tablet Oral BID  . DISCONTD: donepezil  5 mg Oral Daily  . DISCONTD: metoprolol tartrate  12.5 mg Oral BID  . DISCONTD: metoprolol tartrate  25 mg Oral BID  . DISCONTD: NONFORMULARY OR COMPOUNDED ITEM 0.5 each   0.5 each Oral QHS  . DISCONTD: Pramipexole Dihydrochloride  1 tablet Oral Daily   Continuous Infusions:   . dextrose 5 % and 0.45% NaCl 1,000 mL (04/22/12 1140)  . DISCONTD: sodium chloride 100 mL/hr at 04/22/12 0222   PRN Meds:.acetaminophen, acetaminophen, alum & mag hydroxide-simeth, bisacodyl, hydrALAZINE, ondansetron (ZOFRAN) IV, ondansetron, polyethylene glycol, traMADol   Ht: 4\' 6"  (137.2 cm) (per husband)  Pt with significant scoliosis which may affect ht  Wt: 108 lb 11 oz (49.3 kg)  Ideal Wt: unable to assess due to scoliosis, estimated 48.9 kg % Ideal Wt: 101%  Usual Wt: 168 lbs per family (husband and daughter) % Usual Wt: 64%  Body mass index is 26.21 kg/(m^2).  Food/Nutrition Related Hx: decreased appetite and intake PTA, 60 lbs wt loss over 1 year  Labs:  CMP     Component Value Date/Time   NA 146* 04/22/2012 0520   K 3.3* 04/22/2012 0520   CL 108 04/22/2012 0520   CO2 23 04/22/2012 0520   GLUCOSE 60* 04/22/2012 0520   BUN 54* 04/22/2012 0520   CREATININE 2.97* 04/22/2012 0520   CALCIUM 11.2* 04/22/2012 0520   PROT 6.4 04/22/2012 0520   ALBUMIN 3.4* 04/22/2012 0520   AST 25 04/22/2012 0520  ALT 11 04/22/2012 0520   ALKPHOS 82 04/22/2012 0520   BILITOT 0.5 04/22/2012 0520   GFRNONAA 14* 04/22/2012 0520   GFRAA 16* 04/22/2012 0520    CBC    Component Value Date/Time   WBC 8.7 04/22/2012 0520   RBC 4.01 04/22/2012 0520   HGB 11.4* 04/22/2012 0520   HCT 34.4* 04/22/2012 0520   PLT 293 04/22/2012 0520   MCV 85.8 04/22/2012 0520   MCH 28.4 04/22/2012 0520   MCHC 33.1 04/22/2012 0520   RDW 14.8 04/22/2012 0520   LYMPHSABS 1.0 06/24/2009 1650   MONOABS 0.8 06/24/2009 1650   EOSABS 0.0 06/24/2009 1650   BASOSABS 0.0 06/24/2009 1650    Intake: 50% of meals Output:   Intake/Output Summary (Last 24 hours) at 04/22/12 1601 Last data filed at 04/22/12 1458  Gross per 24 hour  Intake   1160 ml  Output   1175 ml  Net    -15 ml   No BM since admission + UOP  Diet  Order: Cardiac  Supplements/Tube Feeding:  None at this time  IVF:    dextrose 5 % and 0.45% NaCl Last Rate: 1,000 mL (04/22/12 1140)  DISCONTD: sodium chloride Last Rate: 100 mL/hr at 04/22/12 0222    Estimated Nutritional Needs:   Kcal: 2952-8413 Protein: 50-58g Fluid: 1.3-1.4 L/day  Pt intermittently confused.  Husband and daughter at bedside report pt has lost >60 lbs over the past year.  They attribute wt loss to decreased intake.  Daughter reports pt has good appetite, however is experiencing early satiety at meals.  She takes a small portion, then is not able to complete meal. Pt drinks up to 1-2 Ensures daily.  Husband reports pt is drinking fluids at home- this has been a problem in the past; they recently obtained a new ice machine with crushed ice which pt really enjoys.  Pt is drinking a Sprite at time of visit. Pt has severe constipation.  Bowel regimen is unknown, husband reports occasional use of laxatives.  They state pt will attempt to go to the bathroom several times per day, however is not typically successful.  Pt occasionally falls asleep on the toilet. Daughter reports weakness in arms making it difficult for pt to feed self. Based on dietary recall from family, pt not likely meeting 75% of her estimated needs.  Pt with 35% wt loss over the past year.  Pt qualifies for severe malnutrition of chronic illness  NUTRITION DIAGNOSIS: Unintended wt loss  RELATED TO: early satiety  AS EVIDENCE BY: pt unable to complete meals, constipation, significant wt loss  MONITORING/EVALUATION(Goals): 1. Food/Beverage; improvement in intake to >50% of meals with resolve of AMS.   2.  Wt/wt change; deter further loss 3.  Labs; pt with acute-on-CKD, monitor trends and modify supplements/diet as needed.  EDUCATION NEEDS: -No education needs identified at this time.  Pt has been on selegiline at home.  Family deny pt following any special diet or having dietary restrictions at  home.  INTERVENTION: 1.  Nutrition-related medication; recommend initiating scheduled bowel regimen to improve regularity and facilitate bowel movements.  Constipation may be contributing to decreased intake. Dulcolax currently ordered prn.   2.  Recommend assistance with meals. MD paged for care order. 3.  Supplements; Ensure once daily  Dietitian [phone]  DOCUMENTATION CODES Per approved criteria  -Severe malnutrition in the context of chronic illness    Hoyt Koch 04/22/2012, 3:57 PM

## 2012-04-22 NOTE — Progress Notes (Signed)
CBG: 61  Treatment: 15 GM carbohydrate snack  Symptoms: None  Follow-up CBG: [id number] Result 119  Possible Reasons for Event: Inadequate meal intake  Comments/MD notified no    [name]

## 2012-04-22 NOTE — Progress Notes (Signed)
04/22/2012 5:17 PM  Dr. Janee Morn notified of patient's foley catheter remaining in place.  Per MD, Foley to continue to stay in place momentarily while on strict I&Os.   Eunice Blase

## 2012-04-22 NOTE — Evaluation (Signed)
Physical Therapy Evaluation Patient Details Name: [name] MRN: [id number] DOB: 11-Apr-1928 Today's Date: 04/22/2012 Time: 7829-5621 PT Time Calculation (min): 24 min  PT Assessment / Plan / Recommendation Clinical Impression  Pt. presents s/p fall and acute encephalopathy with progressive deconditioning over the past 10 months and history of parkinsons disease. Pt will benefit from skilled PT in the acute care setting in order to maximize functional mobility in the acute care setting prior to d/c    PT Assessment  Patient needs continued PT services    Follow Up Recommendations  Skilled nursing facility;Supervision/Assistance - 24 hour    Barriers to Discharge        lEquipment Recommendations  Defer to next venue    Recommendations for Other Services     Frequency Min 3X/week    Precautions / Restrictions Precautions Precautions: Fall Restrictions Weight Bearing Restrictions: No         Mobility  Bed Mobility Bed Mobility: Rolling Left;Left Sidelying to Sit;Sitting - Scoot to [place] of Bed Rolling Left: 3: Mod assist;With rail Left Sidelying to Sit: 1: +2 Total assist Left Sidelying to Sit: Patient Percentage: 50% Sitting - Scoot to Edge of Bed: 2: Max assist Details for Bed Mobility Assistance: Assist for bil LE scooting off EOB and elevating trunk off bed with mod verbal cues throughout for technique and sequencing of steps Transfers Transfers: Sit to Stand;Stand to Sit;Stand Pivot Transfers Sit to Stand: 3: Mod assist;From bed Stand to Sit: 3: Mod assist;To chair/3-in-1;With armrests Stand Pivot Transfers: 3: Mod assist;With armrests Details for Transfer Assistance: Max verbal cues for hand placement and facilitation at hips for anterior weightshift Ambulation/Gait Ambulation/Gait Assistance: Not tested (comment)    Exercises     PT Diagnosis: Difficulty walking  PT Problem List: Decreased strength;Decreased activity tolerance;Decreased mobility;Decreased  knowledge of use of DME;Decreased safety awareness;Decreased knowledge of precautions PT Treatment Interventions: DME instruction;Gait training;Functional mobility training;Therapeutic activities;Therapeutic exercise;Patient/family education   PT Goals Acute Rehab PT Goals PT Goal Formulation: With patient/family Time For Goal Achievement: 04/29/12 Potential to Achieve Goals: Fair Pt will go Supine/Side to Sit: with modified independence PT Goal: Supine/Side to Sit - Progress: Goal set today Pt will go Sit to Supine/Side: with modified independence PT Goal: Sit to Supine/Side - Progress: Goal set today Pt will go Sit to Stand: with supervision PT Goal: Sit to Stand - Progress: Goal set today Pt will go Stand to Sit: with supervision PT Goal: Stand to Sit - Progress: Goal set today Pt will Transfer Bed to Chair/Chair to Bed: with min assist PT Transfer Goal: Bed to Chair/Chair to Bed - Progress: Goal set today Pt will Ambulate: 16 - 50 feet;with min assist;with least restrictive assistive device PT Goal: Ambulate - Progress: Goal set today  Visit Information  Last PT Received On: 04/22/12 Assistance Needed: +2 PT/OT Co-Evaluation/Treatment: Yes    Subjective Data      Prior Functioning  Home Living Lives With: Spouse Available Help at Discharge: Family;Available 24 hours/day Type of Home: House Home Access: Level entry Home Layout: One level Bathroom Shower/Tub: Health visitor: Standard Bathroom Accessibility: Yes How Accessible: Accessible via walker Home Adaptive Equipment: Bedside commode/3-in-1;Built-in shower seat;Walker - rolling Prior Function Level of Independence: Needs assistance Needs Assistance: Bathing;Dressing;Meal Prep;Toileting;Light Housekeeping Bath: Minimal Dressing: Moderate Feeding: Minimal Toileting: Minimal Meal Prep: Maximal Light Housekeeping: Maximal Able to Take Stairs?: No Driving: No Vocation: Retired Comments: The pt.  has needed more assist progressively over the past 10months and level  of assist depends on the day pending the severity of the parkinson symptoms. Levels stated are when pt. was needing the most level of assist on the worst days Communication Communication: Expressive difficulties Dominant Hand: Right    Cognition  Overall Cognitive Status: Impaired Area of Impairment: Safety/judgement;Awareness of deficits Arousal/Alertness: Awake/alert Orientation Level: Disoriented to;Time Behavior During Session: WFL for tasks performed Safety/Judgement: Decreased awareness of safety precautions;Decreased awareness of need for assistance    Extremity/Trunk Assessment Right Upper Extremity Assessment RUE ROM/Strength/Tone: Deficits;Due to pain (rt. shoulder pain. ~90degree flex/abd) RUE ROM/Strength/Tone Deficits: Pt. reports has had rt. shoulder discomfort since fall. RUE Sensation: WFL - Light Touch RUE Coordination: WFL - gross/fine motor Left Upper Extremity Assessment LUE ROM/Strength/Tone: Within functional levels LUE Sensation: WFL - Light Touch LUE Coordination: WFL - gross/fine motor Right Lower Extremity Assessment RLE ROM/Strength/Tone: Unable to fully assess;Due to impaired cognition Left Lower Extremity Assessment LLE ROM/Strength/Tone: Unable to fully assess;Due to impaired cognition   Balance    End of Session PT - End of Session Equipment Utilized During Treatment: Gait belt Activity Tolerance: Patient tolerated treatment well Patient left: in chair;with call bell/phone within reach;with family/visitor present;with nursing in room;with chair alarm set Nurse Communication: Mobility status   Milana Kidney 04/22/2012, 5:32 PM  04/22/2012 Milana Kidney DPT PAGER: [phone] OFFICE: [phone]

## 2012-04-22 NOTE — Progress Notes (Signed)
   CARE MANAGEMENT NOTE 04/22/2012  Patient:  [name], [name]   Account Number:  [id number]  Date Initiated:  04/22/2012  Documentation initiated by:  Darlyne Russian  Subjective/Objective Assessment:   Patient admitted acute encepalopathy.     Action/Plan:   Progression of care and discharge planning   Anticipated DC Date:  04/25/2012   Anticipated DC Plan:  HOME W HOME HEALTH SERVICES      DC Planning Services  CM consult      Choice offered to / List presented to:  C-3 Spouse           HH agency  [facility]   Status of service:  In process, will continue to follow Medicare Important Message given?   (If response is "NO", the following Medicare IM given date fields will be blank) Date Medicare IM given:   Date Additional Medicare IM given:    Discharge Disposition:    Per UR Regulation:  Reviewed for med. necessity/level of care/duration of stay  If discussed at Long Length of Stay Meetings, dates discussed:    Comments:  04/22/2012 1115 Darlyne Russian RN, [place] [phone] Spoke with patient and spouse regarding CM and discharge planning. He is the primary caregiver in the home. No current home health services. DME at home: RW, wheelchair and shower with bench seat. Husband interested in what servcies are available. Talked about home PT services. CM to continue to follow for discharge planning needs.

## 2012-04-22 NOTE — Evaluation (Signed)
Occupational Therapy Evaluation Patient Details Name: [name] MRN: [id number] DOB: 1928-05-10 Today's Date: 04/22/2012 Time: 1410-1441 OT Time Calculation (min): 31 min  OT Assessment / Plan / Recommendation Clinical Impression  Pt. presents s/p fall and acute encephalopathy with progressive deconditioning over the past 10 months and history of parkinsons disease. Pt. will benefit from skilled OT to increase functional independence to min assist-supervision level at D/C to next venue of care/    OT Assessment  Patient needs continued OT Services    Follow Up Recommendations  Skilled nursing facility    Barriers to Discharge None    Equipment Recommendations  Defer to next venue       Frequency  Min 2X/week    Precautions / Restrictions Precautions Precautions: Fall Restrictions Weight Bearing Restrictions: No       ADL  Eating/Feeding: Performed;Maximal assistance Where Assessed - Eating/Feeding: Bed level Grooming: Simulated;Wash/dry face;Minimal assistance (with thoroughness and maintaing hold of washcloth) Where Assessed - Grooming: Supported sitting Upper Body Bathing: Simulated;Maximal assistance Where Assessed - Upper Body Bathing: Unsupported sitting Lower Body Bathing: Simulated;+1 Total assistance Where Assessed - Lower Body Bathing: Unsupported sit to stand Upper Body Dressing: Performed;Moderate assistance (don gown) Where Assessed - Upper Body Dressing: Unsupported sitting Lower Body Dressing: Performed;+1 Total assistance (donning bil socks) Where Assessed - Lower Body Dressing: Unsupported sit to stand Toilet Transfer: Simulated;Moderate assistance Toilet Transfer Method: Squat pivot Toilet Transfer Equipment: Other (comment) Nurse, children's) Toileting - Clothing Manipulation and Hygiene: Simulated;Maximal assistance Where Assessed - Toileting Clothing Manipulation and Hygiene: Sit on 3-in-1 or toilet Tub/Shower Transfer Method: Not  assessed Transfers/Ambulation Related to ADLs: Max verbal cues and tactile cues for hand placement and technique ADL Comments: Pt. with perseverating of movements and increased tremors due to parkinson symptom exacerbation. Pt. with incoherent sentences throughout session and provided with increased time during sequencing of ADL tasks. pt. provided with hand over hand assist for completing ADL tasks to initiate movements and with increased time pt able to complete.    OT Diagnosis: Generalized weakness;Cognitive deficits;Acute pain  OT Problem List: Decreased strength;Decreased activity tolerance;Impaired balance (sitting and/or standing);Decreased cognition;Decreased safety awareness;Decreased knowledge of use of DME or AE;Decreased knowledge of precautions;Impaired tone;Impaired UE functional use OT Treatment Interventions: Self-care/ADL training;DME and/or AE instruction;Therapeutic activities;Patient/family education;Balance training   OT Goals Acute Rehab OT Goals OT Goal Formulation: With patient/family Time For Goal Achievement: 05/06/12 Potential to Achieve Goals: Good ADL Goals Pt Will Perform Eating: with set-up;with supervision;with cueing (comment type and amount) (min verbal for initiation) ADL Goal: Eating - Progress: Goal set today Pt Will Perform Grooming: with set-up;with supervision;Sitting, chair ADL Goal: Grooming - Progress: Goal set today Pt Will Perform Upper Body Dressing: with set-up;with supervision;Sitting, chair ADL Goal: Upper Body Dressing - Progress: Goal set today Pt Will Transfer to Toilet: with min assist;with DME;3-in-1;Stand pivot transfer ADL Goal: Toilet Transfer - Progress: Goal set today Pt Will Perform Toileting - Clothing Manipulation: with min assist;Sitting on 3-in-1 or toilet ADL Goal: Toileting - Clothing Manipulation - Progress: Goal set today Additional ADL Goal #1: Pt. will complete bed mobility with min assist in prep for BADLs ADL Goal:  Additional Goal #1 - Progress: Goal set today  Visit Information  Last OT Received On: 04/22/12 Assistance Needed: +2 PT/OT Co-Evaluation/Treatment: Yes    Subjective Data  Subjective: "I had just gotten out of the shower...then the apples..." Patient Stated Goal: "Get better"   Prior Functioning  Home Living Lives With: Spouse Available Help at  Discharge: Family;Available 24 hours/day Type of Home: House Home Access: Level entry Home Layout: One level Bathroom Shower/Tub: Health visitor: Standard Bathroom Accessibility: Yes How Accessible: Accessible via walker Home Adaptive Equipment: Bedside commode/3-in-1;Built-in shower seat;Walker - rolling Prior Function Level of Independence: Needs assistance Needs Assistance: Bathing;Dressing;Meal Prep;Toileting;Light Housekeeping Bath: Minimal Dressing: Moderate Feeding: Minimal Toileting: Minimal Meal Prep: Maximal Light Housekeeping: Maximal Able to Take Stairs?: No Driving: No Vocation: Retired Comments: The pt. has needed more assist progressively over the past 10months and level of assist depends on the day pending the severity of the parkinson symptoms. Levels stated are when pt. was needing the most level of assist on the worst days Communication Communication: Expressive difficulties Dominant Hand: Right    Cognition  Overall Cognitive Status: Impaired Area of Impairment: Safety/judgement;Awareness of deficits Arousal/Alertness: Awake/alert Orientation Level: Disoriented to;Time Behavior During Session: WFL for tasks performed Safety/Judgement: Decreased awareness of safety precautions;Decreased awareness of need for assistance    Extremity/Trunk Assessment Right Upper Extremity Assessment RUE ROM/Strength/Tone: Deficits;Due to pain (rt. shoulder pain. ~90degree flex/abd) RUE ROM/Strength/Tone Deficits: Pt. reports has had rt. shoulder discomfort since fall. RUE Sensation: WFL - Light Touch RUE  Coordination: WFL - gross/fine motor Left Upper Extremity Assessment LUE ROM/Strength/Tone: Within functional levels LUE Sensation: WFL - Light Touch LUE Coordination: WFL - gross/fine motor   Mobility Bed Mobility Bed Mobility: Rolling Left;Left Sidelying to Sit;Sitting - Scoot to [place] of Bed Rolling Left: 3: Mod assist;With rail Left Sidelying to Sit: 1: +2 Total assist Left Sidelying to Sit: Patient Percentage: 50% Sitting - Scoot to Edge of Bed: 2: Max assist Details for Bed Mobility Assistance: Assist for bil LE scooting off EOB and elevating trunk off bed with mod verbal cues throughout for technique and sequencing of steps Transfers Transfers: Sit to Stand;Stand to Sit Sit to Stand: 3: Mod assist;From bed Stand to Sit: 3: Mod assist;To chair/3-in-1;With armrests Details for Transfer Assistance: Max verbal cues for hand placement and facilitation at hips for anterior weightshift         End of Session OT - End of Session Equipment Utilized During Treatment: Gait belt Activity Tolerance: Patient tolerated treatment well Patient left: in chair;with call bell/phone within reach;with chair alarm set;with family/visitor present Nurse Communication: Mobility status   Raelin Pixler, OTR/L Pager [phone] 04/22/2012, 3:02 PM

## 2012-04-22 NOTE — Progress Notes (Signed)
[facility] Progress Note  This is a resident note. Please see below for attending addendum for additional details.  Subjective:   Currently, the patient is without acute complaints. Slept well overnight. Denies nausea, vomiting, abdominal pain, chest pain, shortness of breath.    Interval Events: - Ca and SCr improved from admission. - Overnight, mental status is slightly improving, with patient being more conversant this AM. However, not always appropriate.  Objective:    Vital Signs:   Temp:  [97.4 F (36.3 C)-98.9 F (37.2 C)] 97.4 F (36.3 C) (06/20 0500) Pulse Rate:  [56-83] 66  (06/20 0500) Resp:  [12-23] 18  (06/20 0500) BP: (128-188)/(64-123) 188/74 mmHg (06/20 0500) SpO2:  [93 %-100 %] 96 % (06/20 0500) Weight:  [108 lb 11 oz (49.3 kg)] 108 lb 11 oz (49.3 kg) (06/19 2005) Last BM Date: 04/21/12  Intake/Output:  06/19 0701 - 06/20 0700 In: -  Out: 625 [Urine:625]  Physical Exam: General Exam:   General: Vital signs reviewed and noted.   Lungs:  Normal respiratory effort. Clear to auscultation BL without crackles or wheezes.  Heart: RRR. S1 and S2 normal without gallop, murmur, or rubs.  Abdomen:  BS normoactive. Soft, Nondistended, non-tender.  No masses or organomegaly.  Extremities: No pretibial edema.     Neurologic Exam:   Mental Status: Alert. Oriented x 2 to person, place, but not to time (does not know season or month). Speech fluent. Some difficulty with following three step commands, has to be redirected multiple times.  Cranial Nerves:   II: Visual fields grossly intact.  III/IV/VI: Extraocular movements intact.  Pupils reactive bilaterally.  V/VII: Smile symmetric. Facial light touch sensation normal bilaterally.  VIII: Grossly intact.  XI: Bilateral shoulder shrug normal.  XII: Midline tongue extension normal.  Motor:  5/5 bilaterally with normal tone and bulk  Sensory:  Light touch intact throughout, bilaterally  DTRs: 2+ and  symmetric throughout  Plantars:  Upgoing bilaterally  Cerebellar: Normal finger-to-nose.    Labs: Basic Metabolic Panel:  Lab 04/22/12 1610 04/21/12 2233 04/21/12 0941  NA 146* -- 140  K 3.3* -- 3.6  CL 108 -- 98  CO2 23 -- 27  GLUCOSE 60* -- 80  BUN 54* -- 60*  CREATININE 2.97* 3.02* 3.50*  CALCIUM 11.2* -- 13.7*  MG -- 2.2 --  PHOS -- 4.0 --    Liver Function Tests:  Lab 04/22/12 0520 04/21/12 0941  AST 25 32  ALT 11 6  ALKPHOS 82 92  BILITOT 0.5 0.4  PROT 6.4 7.4  ALBUMIN 3.4* 3.9    CBC:  Lab 04/22/12 0520 04/21/12 2233 04/21/12 0941  WBC 8.7 6.3 5.9  NEUTROABS -- -- --  HGB 11.4* 11.9* 12.1  HCT 34.4* 36.2 36.8  MCV 85.8 84.4 85.2  PLT 293 276 294    CBG:  Lab 04/22/12 0849 04/22/12 0741 04/21/12 2121 04/21/12 1912 04/21/12 1820  GLUCAP 119* 61* 95 181* 69*    Urinalysis:  Basename 04/21/12 0928  COLORURINE YELLOW  LABSPEC 1.015  PHURINE 6.5  GLUCOSEU NEGATIVE  HGBUR NEGATIVE  BILIRUBINUR NEGATIVE  KETONESUR NEGATIVE  PROTEINUR NEGATIVE  UROBILINOGEN 0.2  NITRITE NEGATIVE  LEUKOCYTESUR TRACE*     Imaging:  Dg Chest 2 View (04/21/2012) - Enlargement of cardiac silhouette. Hiatal hernia. Minimal linear scarring versus subsegmental atelectasis at lingula.  Original Report Authenticated By: Lollie Marrow, M.D.   Ct Head Wo Contrast (04/21/2012) - 1.  Slight decrease in size of a right periorbital  hematoma. 2.  No acute intracranial abnormality or significant interval change. 2.  Stable atrophy and white matter disease.  Original Report Authenticated By: Jamesetta Orleans. MATTERN, M.D.   Ct Head Wo Contrast (04/20/2012) - No acute intracranial abnormality.  There is soft tissue swelling and scalp hematoma in the right frontal region, hematoma measures 2.8 x 1 cm.  Stable cerebral atrophy and chronic white matter disease. Basilar thickening with almost complete opacification of the right maxillary sinus.  Original Report Authenticated By: Natasha Mead,  M.D.   US Renal (04/22/2012) - No hydronephrosis.  Original Report Authenticated By: Waneta Martins, M.D.   Mr Brain Ltd W/o Cm (04/21/2012) - Incomplete study.  The patient was not able to cooperate for the examination.  No acute infarct is identified.  Original Report Authenticated By: Camelia Phenes, M.D.     Medications:    Infusions:    . dextrose 5 % and 0.45% NaCl    . DISCONTD: sodium chloride 100 mL/hr at 04/22/12 0222    Scheduled Medications:    . sodium chloride   Intravenous STAT  . amantadine  100 mg Oral Daily  . aspirin  325 mg Oral Daily  . beta carotene w/minerals  1 tablet Oral Daily  . carbidopa-levodopa  1 tablet Oral q morning - 10a  . clonazePAM  0.5 mg Oral QHS  . dextrose  1 ampule Intravenous Once  . docusate sodium  100 mg Oral BID  . heparin  5,000 Units Subcutaneous Q8H  . insulin aspart  0-9 Units Subcutaneous TID WC  . LORazepam  0.5 mg Intravenous Once  . metoprolol tartrate  12.5 mg Oral BID  . mirabegron ER  50 mg Oral Daily  . NONFORMULARY OR COMPOUNDED ITEM 0.5 each  0.5 each Oral QHS  . omega-3 acid ethyl esters  2 g Oral QHS  . pamidronate  60 mg Intravenous Once  . pantoprazole  40 mg Oral Q0600  . potassium chloride  40 mEq Oral Once  . Pramipexole Dihydrochloride  1 tablet Oral Daily  . sodium chloride  1,000 mL Intravenous Once  . sodium chloride  3 mL Intravenous Q12H  . DISCONTD: carbidopa-levodopa  1 tablet Oral BID  . DISCONTD: donepezil  5 mg Oral Daily  . DISCONTD: metoprolol tartrate  12.5 mg Oral BID  . DISCONTD: metoprolol tartrate  25 mg Oral BID  . DISCONTD: NONFORMULARY OR COMPOUNDED ITEM 0.5 each  0.5 each Oral QHS    PRN Medications: acetaminophen, acetaminophen, alum & mag hydroxide-simeth, bisacodyl, hydrALAZINE, ondansetron (ZOFRAN) IV, ondansetron, polyethylene glycol, traMADol   Assessment/ Plan:    Pt is a 78 y.o. yo female with a PMHX of severe Parkinson Disease, nonobstructive CAD, DMII, and adrenal  insufficiency (not on current home steroid therapy), was admitted to [facility] on 04/21/2012 with 6 day history of progressively worsening confusion, motor rigidity, and frequent falls and encephalopathic state on the day of admission. On admission, patient was found to be significantly hypercalcemic, for which medicine service will manage. Neurology service is consulted to evaluate progressive Parkinson disease over last year, and to provide recommendation regarding appropriate Parkinson medications.  1) Acute encephalopathy -  Mental status clearing slightly. Acute encephalopathy likely secondary to hypercalcemia (slightly improved) and acute on chronic renal failure. Primary medical service is treating with IV fluids and pamidronate. Unfortunately, MRI brain was inadequate study due to motion.   Per primary service.  Pending TSH, vitamin D, iCa, phosphorus, magnesium levels.  2) Parkinson disease -  the patient has had progressive decline over the past year (progressive motor rigidity leading to frequent falls, inability to feed herself, and limited her capacity to perform ADLs and IADLs). This likely reflects progression of her Parkinson disease. As an outpatient, the patient was treated with Sinemet (levodopa-carbadopa), Mirapex (dopamine agonist), Amantadine (NMDA antagonist) and previously was on selegiline (MAO inhibitor - which was discontinued approximately one week ago secondary to concern for interaction with her newly started Effexor and Aricept).  Plan:  Continue current home medications including Mirapex, Sinemet, Amantadine  Continue to hold Aricept.  Would not resume Selegiline currently because do not want to obscure / confuse symptoms and findings of her acute encephalopathic state.  If symptoms of hallucinations/ confusion continue after acute issues resolve, can consider to discontinue Amantadine (likely as guided by her primary neurologist) given that in the elderly, use of NMDA  antagonist + DA agonist can potentiate side effects. However, again, these are long term medications and likely are not acute cause of her presenting symptoms.  PT / OT consult for evaluation of home health services - patient progressively worsening motor rigidity with frequent falls.   Patient history and plan of care reviewed with neurology attending, Dr. Thana Farr.  Signed: Johnette Abraham, D.ODonnajean Lopes, Internal Medicine Resident Pager: [phone] (7AM-5PM) 04/22/2012, 8:50 AM     Hospital course and management discussed at length. I agree with the above.  Thana Farr, MD [facility] [phone]  04/22/2012  5:18 PM

## 2012-04-23 DIAGNOSIS — F29 Unspecified psychosis not due to a substance or known physiological condition: Secondary | ICD-10-CM

## 2012-04-23 DIAGNOSIS — G9340 Encephalopathy, unspecified: Secondary | ICD-10-CM

## 2012-04-23 DIAGNOSIS — N179 Acute kidney failure, unspecified: Secondary | ICD-10-CM

## 2012-04-23 DIAGNOSIS — G20 Parkinson's disease: Secondary | ICD-10-CM

## 2012-04-23 LAB — CBC
HCT: 33.6 % — ABNORMAL LOW (ref 36.0–46.0)
MCH: 28.6 pg (ref 26.0–34.0)
MCV: 85.7 fL (ref 78.0–100.0)
Platelets: 274 10*3/uL (ref 150–400)
RBC: 3.92 MIL/uL (ref 3.87–5.11)
RDW: 15.1 % (ref 11.5–15.5)

## 2012-04-23 LAB — BASIC METABOLIC PANEL
CO2: 23 mEq/L (ref 19–32)
Calcium: 10.9 mg/dL — ABNORMAL HIGH (ref 8.4–10.5)
Chloride: 108 mEq/L (ref 96–112)
Creatinine, Ser: 2.41 mg/dL — ABNORMAL HIGH (ref 0.50–1.10)
Glucose, Bld: 106 mg/dL — ABNORMAL HIGH (ref 70–99)

## 2012-04-23 LAB — GLUCOSE, CAPILLARY
Glucose-Capillary: 82 mg/dL (ref 70–99)
Glucose-Capillary: 117 mg/dL — ABNORMAL HIGH (ref 70–99)
Glucose-Capillary: 111 mg/dL — ABNORMAL HIGH (ref 70–99)
Glucose-Capillary: 128 mg/dL — ABNORMAL HIGH (ref 70–99)

## 2012-04-23 MED ORDER — LORAZEPAM 2 MG/ML IJ SOLN
1.0000 mg | Freq: Once | INTRAMUSCULAR | Status: AC
  Administered 2012-04-23: 1 mg via INTRAVENOUS
  Filled 2012-04-23: qty 1

## 2012-04-23 MED ORDER — CARBIDOPA-LEVODOPA 25-100 MG PO TABS
1.0000 | ORAL_TABLET | Freq: Every day | ORAL | Status: DC
  Administered 2012-04-24 – 2012-04-28 (×5): 1 via ORAL
  Filled 2012-04-23 (×5): qty 1

## 2012-04-23 MED ORDER — CARBIDOPA-LEVODOPA 25-100 MG PO TABS
0.5000 | ORAL_TABLET | Freq: Every day | ORAL | Status: DC
  Administered 2012-04-23 – 2012-04-24 (×2): 0.5 via ORAL
  Administered 2012-04-25: via ORAL
  Administered 2012-04-26 – 2012-04-27 (×2): 0.5 via ORAL
  Filled 2012-04-23 (×6): qty 0.5

## 2012-04-23 MED ORDER — PRAMIPEXOLE DIHYDROCHLORIDE 0.125 MG PO TABS
0.1250 mg | ORAL_TABLET | Freq: Three times a day (TID) | ORAL | Status: DC
  Administered 2012-04-23 (×3): 0.125 mg via ORAL
  Administered 2012-04-24: 11:00:00 via ORAL
  Administered 2012-04-24 – 2012-04-28 (×11): 0.125 mg via ORAL
  Filled 2012-04-23 (×19): qty 1

## 2012-04-23 NOTE — Progress Notes (Signed)
   CARE MANAGEMENT NOTE 04/23/2012  Patient:  [name], [name]   Account Number:  [id number]  Date Initiated:  04/22/2012  Documentation initiated by:  Darlyne Russian  Subjective/Objective Assessment:   Patient admitted acute encepalopathy.     Action/Plan:   Progression of care and discharge planning   Anticipated DC Date:  04/25/2012   Anticipated DC Plan:  HOME W HOME HEALTH SERVICES      DC Planning Services  CM consult      Choice offered to / List presented to:  C-3 Spouse           HH agency  [facility]   Status of service:  In process, will continue to follow Medicare Important Message given?   (If response is "NO", the following Medicare IM given date fields will be blank) Date Medicare IM given:   Date Additional Medicare IM given:    Discharge Disposition:    Per UR Regulation:  Reviewed for med. necessity/level of care/duration of stay  If discussed at Long Length of Stay Meetings, dates discussed:    Comments:  04/23/2012 1130 Darlyne Russian RN,CCM [phone] SW and CM met with spouse regarding discharge planning and PT recommendations for SNF. He agrees with the discharge to a SNF. CM to continue to follow for discharge planning needs.  04/22/2012 1115 Darlyne Russian RN, CCM [phone] Spoke with patient and spouse regarding CM and discharge planning. He is the primary caregiver in the home. No current home health services. DME at home: RW, wheelchair and shower with bench seat. Husband interested in what servcies are available. Talked about home PT services. CM to continue to follow for discharge planning needs.

## 2012-04-23 NOTE — Progress Notes (Signed)
Subjective: Patient is more alert today and sitting in chair. Per husband patient still confused, and not yet at baseline. Husband feels patient's selgiline needs to be resumed.  Objective: Vital signs in last 24 hours: Filed Vitals:   04/22/12 2109 04/23/12 0554 04/23/12 0938 04/23/12 1308  BP: 164/57 196/90 187/75 183/78  Pulse: 56 53 48 50  Temp: 97.9 F (36.6 C) 97.5 F (36.4 C) 97.4 F (36.3 C)   TempSrc: Oral Oral Oral   Resp: 18 18 18    Height:      Weight: 49.3 kg (108 lb 11 oz)     SpO2: 97% 97% 98%     Intake/Output Summary (Last 24 hours) at 04/23/12 1506 Last data filed at 04/23/12 1044  Gross per 24 hour  Intake 1604.5 ml  Output    800 ml  Net  804.5 ml    Weight change: 0 kg (0 lb)  General: Alert, awake, oriented x3, in no acute distress. HEENT: No bruits, no goiter. Right ecchymotic area around the eye. Heart: Regular rate and rhythm, without murmurs, rubs, gallops. Lungs: Clear to auscultation bilaterally. Abdomen: Soft, nontender, nondistended, positive bowel sounds. Extremities: No clubbing cyanosis or edema with positive pedal pulses.    Lab Results:  Basename 04/23/12 0525 04/22/12 0520 04/21/12 2233  NA 142 146* --  K 3.8 3.3* --  CL 108 108 --  CO2 23 23 --  GLUCOSE 106* 60* --  BUN 48* 54* --  CREATININE 2.41* 2.97* --  CALCIUM 10.9* 11.2* --  MG -- -- 2.2  PHOS -- -- 4.0    Basename 04/23/12 0525 04/22/12 0520 04/21/12 0941  AST -- 25 32  ALT -- 11 6  ALKPHOS -- 82 92  BILITOT -- 0.5 0.4  PROT -- 6.4 7.4  ALBUMIN 3.2* 3.4* --   No results found for this basename: LIPASE:2,AMYLASE:2 in the last 72 hours  Basename 04/23/12 0525 04/22/12 0520  WBC 6.8 8.7  NEUTROABS -- --  HGB 11.2* 11.4*  HCT 33.6* 34.4*  MCV 85.7 85.8  PLT 274 293   No results found for this basename: CKTOTAL:3,CKMB:3,CKMBINDEX:3,TROPONINI:3 in the last 72 hours No components found with this basename: POCBNP:3 No results found for this basename:  DDIMER:2 in the last 72 hours  Basename 04/21/12 2233 04/21/12 1506  HGBA1C 6.3* 6.4*    Basename 04/22/12 0520  CHOL 156  HDL 69  LDLCALC 74  TRIG 64  CHOLHDL 2.3  LDLDIRECT --    Basename 04/21/12 2233  TSH 3.550  T4TOTAL --  T3FREE --  THYROIDAB --   No results found for this basename: VITAMINB12:2,FOLATE:2,FERRITIN:2,TIBC:2,IRON:2,RETICCTPCT:2 in the last 72 hours  Micro Results: No results found for this or any previous visit (from the past 240 hour(s)).  Studies/Results: US Renal  04/22/2012  *RADIOLOGY REPORT*  Clinical Data: Hypertension, acute renal failure.  RENAL/URINARY TRACT ULTRASOUND COMPLETE  Comparison:  05/28/2011 CT  Findings:  Right Kidney:  Measures 11.6 cm.  Bifid renal pelvis and/or partial duplication.  No hydronephrosis.  No focal abnormality.  Left Kidney:  Measures 9.3 cm.  Poor acoustic windows limits visualization. No hydronephrosis.  Question heterogeneous/increased echogenicity.This can be seen with medical renal disease, however typically that would be bilateral.  Bladder:  Partially decompressed, with a Foley catheter balloon in place.  Prevoid volume of 78 ml.  IMPRESSION: No hydronephrosis.  Original Report Authenticated By: Waneta Martins, M.D.   Mr Brain Ltd W/o Cm  04/21/2012  *RADIOLOGY REPORT*  Clinical Data:  Altered mental status  MRI HEAD WITHOUT CONTRAST  Technique:  Multiplanar, multiecho pulse sequences of the brain and surrounding structures were obtained according to standard protocol without intravenous contrast.  Comparison: CT 04/21/2012  Findings: Incomplete study.  The patient was moving throughout the study  and was not able to complete the study.  Axial diffusion and FLAIR and sagittal T1 imaging was performed.  Negative for acute infarct.  There is generalized atrophy.  No significant chronic ischemic change.  Right maxillary sinus is filled with secretions.  There is a right frontal scalp hematoma, unchanged from the  prior  study.  IMPRESSION: Incomplete study.  The patient was not able to cooperate for the examination.  No acute infarct is identified.  Original Report Authenticated By: Camelia Phenes, M.D.    Medications:     . amantadine  100 mg Oral Daily  . aspirin  325 mg Oral Daily  . beta carotene w/minerals  1 tablet Oral Daily  . carbidopa-levodopa  0.5 tablet Oral QHS  . carbidopa-levodopa  1 tablet Oral Daily  . clonazePAM  0.5 mg Oral QHS  . docusate sodium  100 mg Oral BID  . feeding supplement  237 mL Oral Q1500  . heparin  5,000 Units Subcutaneous Q8H  . insulin aspart  0-9 Units Subcutaneous TID WC  . LORazepam  1 mg Intravenous Once  . metoprolol tartrate  12.5 mg Oral BID  . mirabegron ER  50 mg Oral Daily  . omega-3 acid ethyl esters  2 g Oral QHS  . pantoprazole  40 mg Oral Q0600  . polyethylene glycol  17 g Oral Daily  . pramipexole  0.125 mg Oral TID  . sodium chloride  3 mL Intravenous Q12H  . DISCONTD: carbidopa-levodopa  1 tablet Oral q morning - 10a  . DISCONTD: NONFORMULARY OR COMPOUNDED ITEM 0.5 each  0.5 each Oral QHS    Assessment: Principal Problem:  *Encephalopathy acute Active Problems:  Hypercalcemia  Renal failure (ARF), acute on chronic  Dehydration  HTN (hypertension)  Hyperlipidemia  DM (diabetes mellitus)  Parkinson's disease  CAD (coronary artery disease)  Barrett esophagus  PUD (peptic ulcer disease)  Anxiety  Fibromyalgia  Falls  Hypokalemia   Plan: #1 acute encephalopathy  Likely secondary to hypercalcemia with a calcium level of 13. Patient is on oral calcium supplements and vitamin D. Per husband patient had a colonoscopy and a mammogram done however not sure of the actual results but states it was not abnormal. Patient does have altered mental status, constipation, dehydration. CT of the head was negative. Chest x-ray which was done was negative. MRI was incomplete however no acute findings. Ionized calcium pending. intact PTH WNL.  Phosphorus WNL. Magnesium WNL.  Vitamin D pending, TSH WNL. improving clinically. Corrected calcium level today is 11.5 Continue to hold patient's oral vitamin D supplements and oral calcium supplements. Increase fluids 100cc/hour. Will hold patient's Lasix. S/P IV pamidronate. Follow. Neurology ffng and appreciate input and recommendations. #2 hypercalcemia  Questionable etiology. May be secondary to exogenous calcium supplementation. Per husband patient has had a colonoscopy and a mammogram done not sure exactly how far back however states that were not abnormal. Chest x-ray which was done was negative. CT of the head which was done was negative. MRI of the head negative, TSH WNL, phosphorus  within normal limits,  magnesium level within normal limits,  ionized calcium pending,  intact PTH WNL.  Corrected calcium is 11.60from 13. Slowly improving clinically but not  yet at baseline.Increase IVF100cc/h hour. Holding patient's Lasix. Status post IV pamidronate.Follow.  #3 acute on chronic renal failure  Patient's last creatinine from 03/28/2011 was 1.54. I suspect patient does have a baseline chronic kidney disease. Patient's creatinine on admission was 3.50. Likely secondary to a prerenal azotemia secondary to dehydration, in the setting of diuretics. Renal function slowly improving and creatinine today at 2.41. Renal ultrasound negative for hydronephrosis. Continue Foley catheter. Increase fluids to 100cc/h  and follow. Monitor closely for volume overload as patient does have an EF of less than 50%. Continue to hold patient's Lasix.  #4 Parkinson's disease  Per family patient has had a worsening of her ataxia and increasing the tremors after her medications were changed by her neurologist. Will continue patient carbidopa and levodopa as well as her amantadine, and Mirapex. Aricept was discontinued per neurology. PT OT. Neurology following and appreciate input and recommendations #5 anxiety  Continue home  dose clonazepam.  #6 hyperlipidemia  LDL is 74. #7 hypertension  Patient's systolic blood pressure in the ED is elevated. Patient does not seem to be on any antihypertensive medications from her med rec list. Continue low-dose Lopressor and titrate as tolerated.  #8 history of peptic ulcer disease  PPI.  #9 well controlled type 2 diabetes  Hemoglobin A1c = 6.4. Continue sliding scale insulin.  #10 history of coronary artery disease  Stable. Continue low-dose beta blocker. Continue home dose aspirin. Will hold patient's Lasix secondary to problem #1 and will resume once patient is euvolemic. #11 hypokalemia Replete. # 12 falls Likely secondary to deteriorating Parkinson's disease. Continue current regimen. PT /OT.  #13 prophylaxis  PPI for GI prophylaxis, heparin for DVT prophylaxis.  #14 CODE STATUS  DO NOT RESUSCITATE.    LOS: 2 days   Kortney Potvin [phone] 04/23/2012, 3:06 PM

## 2012-04-23 NOTE — Progress Notes (Signed)
Physical Therapy Treatment Patient Details Name: [name] MRN: [id number] DOB: Apr 17, 1928 Today's Date: 04/23/2012 Time: 9147-8295 PT Time Calculation (min): 19 min  PT Assessment / Plan / Recommendation Comments on Treatment Session  Pt. still confused and disoriented.  Able to mobilize a short distance with assist of 2.      Follow Up Recommendations  Skilled nursing facility;Supervision/Assistance - 24 hour;Supervision for mobility/OOB    Barriers to Discharge        Equipment Recommendations  Defer to next venue    Recommendations for Other Services    Frequency Min 3X/week   Plan Discharge plan remains appropriate    Precautions / Restrictions Precautions Precautions: Fall Restrictions Weight Bearing Restrictions: No   Pertinent Vitals/Pain No report of pain, no distress    Mobility  Bed Mobility Bed Mobility: Rolling Left;Left Sidelying to Sit;Sitting - Scoot to [place] of Bed Rolling Left: 3: Mod assist;With rail Left Sidelying to Sit: 1: +2 Total assist Left Sidelying to Sit: Patient Percentage: 50% Sitting - Scoot to Edge of Bed: 2: Max assist Details for Bed Mobility Assistance: manual assist and multiple cues for technique Transfers Transfers: Sit to Stand;Stand to Sit Sit to Stand: 1: +2 Total assist;With upper extremity assist;From bed Sit to Stand: Patient Percentage: 50% Stand to Sit: 3: Mod assist;To chair/3-in-1;With armrests Details for Transfer Assistance: Max verbal cues for hand placement and facilitation at hips for anterior weightshift Ambulation/Gait Ambulation/Gait Assistance: 1: +2 Total assist Ambulation/Gait: Patient Percentage: 60% Ambulation Distance (Feet): 10 Feet Assistive device: Rolling walker Ambulation/Gait Assistance Details: Pt's gait limited by effects of Parkinson's disease.  Shuffles steps and needs manual facilitation for anterior weight shift.. Gait Pattern: Shuffle;Decreased step length - right;Decreased step length -  left;Decreased hip/knee flexion - right;Decreased hip/knee flexion - left;Decreased dorsiflexion - right;Decreased dorsiflexion - left Gait velocity: slowed    Exercises     PT Diagnosis:    PT Problem List:   PT Treatment Interventions:     PT Goals Acute Rehab PT Goals PT Goal: Supine/Side to Sit - Progress: Progressing toward goal PT Goal: Sit to Supine/Side - Progress: Progressing toward goal PT Goal: Sit to Stand - Progress: Progressing toward goal PT Goal: Stand to Sit - Progress: Progressing toward goal PT Goal: Ambulate - Progress: Progressing toward goal  Visit Information  Last PT Received On: 04/23/12 Assistance Needed: +2    Subjective Data      Cognition  Overall Cognitive Status: Impaired Area of Impairment: Safety/judgement;Awareness of deficits Arousal/Alertness: Awake/alert Orientation Level: Disoriented to;Time Behavior During Session: WFL for tasks performed Safety/Judgement: Decreased awareness of safety precautions;Decreased awareness of need for assistance    Balance     End of Session PT - End of Session Equipment Utilized During Treatment: Gait belt Activity Tolerance: Patient tolerated treatment well Patient left: with chair alarm set;in chair;with call bell/phone within reach;with nursing in room Nurse Communication: Mobility status    Ferman Hamming 04/23/2012, 10:56 AM Acute Rehabilitation Services [phone] (pager)

## 2012-04-23 NOTE — Progress Notes (Signed)
[facility] Progress Note  This is a resident note. Please see below for attending addendum for additional details.  Subjective:    Currently, the patient is having improving comprehension and is more appropriate with answering questions. However, husband indicates occasional still inappropriate speech and mental status not to baseline.    Interval Events: None.    Objective:    Vital Signs:   Temp:  [97.4 F (36.3 C)-97.9 F (36.6 C)] 97.4 F (36.3 C) (06/21 0938) Pulse Rate:  [48-58] 50  (06/21 1308) Resp:  [18] 18  (06/21 0938) BP: (138-196)/(57-90) 156/79 mmHg (06/21 1516) SpO2:  [97 %-98 %] 98 % (06/21 0938) Weight:  [108 lb 11 oz (49.3 kg)] 108 lb 11 oz (49.3 kg) (06/20 2109) Last BM Date: 04/21/12  Intake/Output:  06/20 0701 - 06/21 0700 In: 2524.5 [P.O.:837; I.V.:1687.5] Out: 850 [Urine:850]   Physical Exam: General Exam:   General: Vital signs reviewed and noted.   Lungs:  Normal respiratory effort. Clear to auscultation BL without crackles or wheezes.  Heart: RRR. S1 and S2 normal without gallop, murmur, or rubs.  Abdomen:  BS normoactive. Soft, Nondistended, non-tender.  No masses or organomegaly.  Extremities: No pretibial edema.     Neurologic Exam:   Mental Status: Alert. Oriented x 2 to person and time (season), but not to place (thinks she is at home). Speech fluent. Some difficulty with following three step commands, has to be redirected multiple times.  Cranial Nerves:   II: Visual fields grossly intact.  III/IV/VI: Extraocular movements intact.  Pupils reactive bilaterally.  V/VII: Smile symmetric. Facial light touch sensation normal bilaterally.  VIII: Grossly intact.  XI: Bilateral shoulder shrug normal.  XII: Midline tongue extension normal.  Motor:  5/5 bilaterally with normal tone and bulk  Sensory:  Light touch intact throughout, bilaterally  DTRs: 2+ and symmetric throughout  Plantars:  Upgoing bilaterally  Cerebellar:  Normal finger-to-nose.    Labs: Basic Metabolic Panel:  Lab 04/23/12 0865 04/22/12 0520 04/21/12 2233  NA 142 146* --  K 3.8 3.3* --  CL 108 108 --  CO2 23 23 --  GLUCOSE 106* 60* --  BUN 48* 54* --  CREATININE 2.41* 2.97* 3.02*  CALCIUM 10.9* 11.2* 12.1*  MG -- -- 2.2  PHOS -- -- 4.0    Liver Function Tests:  Lab 04/23/12 0525 04/22/12 0520 04/21/12 0941  AST -- 25 32  ALT -- 11 6  ALKPHOS -- 82 92  BILITOT -- 0.5 0.4  PROT -- 6.4 7.4  ALBUMIN 3.2* 3.4* 3.9    CBC:  Lab 04/23/12 0525 04/22/12 0520 04/21/12 2233 04/21/12 0941  WBC 6.8 8.7 6.3 5.9  NEUTROABS -- -- -- --  HGB 11.2* 11.4* 11.9* 12.1  HCT 33.6* 34.4* 36.2 36.8  MCV 85.7 85.8 84.4 85.2  PLT 274 293 276 294    CBG:  Lab 04/23/12 1159 04/23/12 0747 04/22/12 2112 04/22/12 1657 04/22/12 1135  GLUCAP 117* 82 139* 120* 128*    Urinalysis:  Basename 04/21/12 0928  COLORURINE YELLOW  LABSPEC 1.015  PHURINE 6.5  GLUCOSEU NEGATIVE  HGBUR NEGATIVE  BILIRUBINUR NEGATIVE  KETONESUR NEGATIVE  PROTEINUR NEGATIVE  UROBILINOGEN 0.2  NITRITE NEGATIVE  LEUKOCYTESUR TRACE*     Lab Results  Component Value Date   TSH 3.550 04/21/2012     Imaging:  Dg Chest 2 View (04/21/2012) - Enlargement of cardiac silhouette. Hiatal hernia. Minimal linear scarring versus subsegmental atelectasis at lingula.  Original Report Authenticated By: Redge Gainer.  Tyron Russell, M.D.   Ct Head Wo Contrast (04/21/2012) - 1.  Slight decrease in size of a right periorbital hematoma. 2.  No acute intracranial abnormality or significant interval change. 2.  Stable atrophy and white matter disease.  Original Report Authenticated By: Jamesetta Orleans. MATTERN, M.D.   Ct Head Wo Contrast (04/20/2012) - No acute intracranial abnormality.  There is soft tissue swelling and scalp hematoma in the right frontal region, hematoma measures 2.8 x 1 cm.  Stable cerebral atrophy and chronic white matter disease. Basilar thickening with almost complete  opacification of the right maxillary sinus.  Original Report Authenticated By: Natasha Mead, M.D.   US Renal (04/22/2012) - No hydronephrosis.  Original Report Authenticated By: Waneta Martins, M.D.   Mr Brain Ltd W/o Cm (04/21/2012) - Incomplete study.  The patient was not able to cooperate for the examination.  No acute infarct is identified.  Original Report Authenticated By: Camelia Phenes, M.D.     Medications:    Infusions:    . dextrose 5 % and 0.45% NaCl 100 mL/hr at 04/23/12 1313    Scheduled Medications:    . amantadine  100 mg Oral Daily  . aspirin  325 mg Oral Daily  . beta carotene w/minerals  1 tablet Oral Daily  . carbidopa-levodopa  0.5 tablet Oral QHS  . carbidopa-levodopa  1 tablet Oral Daily  . clonazePAM  0.5 mg Oral QHS  . docusate sodium  100 mg Oral BID  . feeding supplement  237 mL Oral Q1500  . heparin  5,000 Units Subcutaneous Q8H  . insulin aspart  0-9 Units Subcutaneous TID WC  . LORazepam  1 mg Intravenous Once  . metoprolol tartrate  12.5 mg Oral BID  . mirabegron ER  50 mg Oral Daily  . omega-3 acid ethyl esters  2 g Oral QHS  . pantoprazole  40 mg Oral Q0600  . polyethylene glycol  17 g Oral Daily  . pramipexole  0.125 mg Oral TID  . sodium chloride  3 mL Intravenous Q12H  . DISCONTD: carbidopa-levodopa  1 tablet Oral q morning - 10a  . DISCONTD: NONFORMULARY OR COMPOUNDED ITEM 0.5 each  0.5 each Oral QHS    PRN Medications: acetaminophen, acetaminophen, alum & mag hydroxide-simeth, bisacodyl, hydrALAZINE, ondansetron (ZOFRAN) IV, ondansetron, polyethylene glycol, traMADol   Assessment/ Plan:    Pt is a 78 y.o. yo female with a PMHX of severe Parkinson Disease, nonobstructive CAD, DMII, and adrenal insufficiency (not on current home steroid therapy), was admitted to [facility] on 04/21/2012 with 6 day history of progressively worsening confusion, motor rigidity, and frequent falls and encephalopathic state on the day of admission. On admission,  patient was found to be significantly hypercalcemic, for which medicine service will manage. Neurology service is consulted to evaluate progressive Parkinson disease over last year, and to provide recommendation regarding appropriate Parkinson medications.   1) Acute encephalopathy -  Mental status clearing slightly. Acute encephalopathy likely secondary to hypercalcemia (improved) and acute on chronic renal failure (improved). Primary medical service is treating with IV fluids and pamidronate. Unfortunately, MRI brain was inadequate study due to motion. Notably, TSH wnl, 25-OH vitamin D wnl.   Per primary service.  2) Parkinson disease - the patient has had progressive decline over the past year (progressive motor rigidity leading to frequent falls, inability to feed herself, and limited her capacity to perform ADLs and IADLs). This likely reflects progression of her Parkinson disease. As an outpatient, the patient was treated with  Sinemet (levodopa-carbadopa), Mirapex (dopamine agonist), Amantadine (NMDA antagonist) and previously was on selegiline (MAO inhibitor - which was discontinued approximately one week ago secondary to concern for interaction with her newly started Effexor and Aricept).  Plan:  Continue current home medications including Mirapex, Sinemet, Amantadine  Continue to hold Aricept.  Continue to hold Selegiline currently because do not want to obscure / confuse symptoms and findings of her acute encephalopathic state.  If symptoms of hallucinations/ confusion continue after acute issues resolve, can consider to discontinue Amantadine (likely as guided by her primary neurologist) given that in the elderly, use of NMDA antagonist + DA agonist can potentiate side effects. However, again, these are long term medications and likely are not acute cause of her presenting symptoms.  PT / OT consult recommend SNF as patient has progressively worsening motor rigidity with frequent  falls.   LOS: 2 days   Patient history and plan of care reviewed with neurology attending, Dr. Thana Farr.   Signed: Johnette Abraham, D.ODonnajean Lopes, Internal Medicine Resident Pager: [phone] (7AM-5PM) 04/23/2012, 4:18 PM     Patient seen and examined. Hospital course and management discussed at length.  I agree with the above.  Thana Farr, MD [facility] [phone]  04/23/2012  6:34 PM

## 2012-04-23 NOTE — Clinical Social Work Psychosocial (Signed)
Clinical Social Work Department BRIEF PSYCHOSOCIAL ASSESSMENT 04/23/2012  Patient:  [name], [name]     Account Number:  [id number]     Admit date:  04/21/2012  Clinical Social Worker:  Delmer Islam  Date/Time:  04/23/2012 12:00 M  Referred by:  Physician  Date Referred:  04/22/2012 Referred for  SNF Placement   Other Referral:   Interview type:  Family Other interview type:   [name] was primarily with husband as patient is verbal but still confused.    PSYCHOSOCIAL DATA Living Status:  HUSBAND Admitted from facility:   Level of care:   Primary support name:  [name] Primary support relationship to patient:   Degree of support available:   Strong support - husband is primary caregiver    CURRENT CONCERNS Current Concerns  Post-Acute Placement   Other Concerns:    SOCIAL WORK ASSESSMENT / PLAN CSW and RN Case Manager talked with husband regarding discharge planning. The conversation had begun on 6/20 by RN case mgr regarrding discharge options (Home w/HH vs SNF). CSW talked with husband about the SNF search process and he was given a skilled facility list of [place]. When asked, husband responded that patient has been to 2 facilities before for rehab - [facility] and [facility]. They liked [facility] but it is [place] the [place] side [place] for [place]. His preference is [facility] as it is near their home.   Assessment/plan status:  Psychosocial Support/Ongoing Assessment of Needs Other assessment/ plan:   Information/referral to community resources:   Skilled nursing facility list for [facility]    PATIENT'S/FAMILY'S RESPONSE TO PLAN OF CARE: Patient is pleasantly confused and not able to engage in conversation regarding discharge planning. [name] was very engaged in the conversation and is in total agreement with short-term rehab and gave his preference in a skilled facility.

## 2012-04-23 NOTE — Clinical Social Work Placement (Addendum)
Clinical Social Work Department CLINICAL SOCIAL WORK PLACEMENT NOTE 04/23/2012  Patient:  [name], [name]  Account Number:  [id number] Admit date:  04/21/2012  Clinical Social Worker:  Genelle Bal, LCSW  Date/time:  04/23/2012 02:42 AM  Clinical Social Work is seeking post-discharge placement for this patient at the following level of care:   SKILLED NURSING   (*CSW will update this form in Epic as items are completed)   04/23/2012  Patient/family provided with [facility] Department of Clinical Social Work's list of facilities offering this level of care within the geographic area requested by the patient (or if unable, by the patient's family).  04/23/2012  Patient/family informed of their freedom to choose among providers that offer the needed level of care, that participate in Medicare, Medicaid or managed care program needed by the patient, have an available bed and are willing to accept the patient.    Patient/family informed of [facility]' ownership interest in [facility], as well as of the fact that they are under no obligation to receive care at this facility.  PASARR submitted to EDS on 04/23/2012 PASARR number received from EDS on 04/23/2012  FL2 transmitted to all facilities in geographic area requested by pt/family on  04/23/2012 FL2 transmitted to all facilities within larger geographic area on   Patient informed that his/her managed care company has contracts with or will negotiate with  certain facilities, including the following:     Patient/family informed of bed offers received: 04/26/12 Patient chooses bed at [facility] Physician recommends and patient chooses bed at    Patient to be transferred to [facility] on 04/28/12  Patient to be transferred to facility by husband  The following physician request were entered in Epic:   Additional Comments: 04/23/12: Husband's preference is [facility]. 04/27/12 - Per husband's request, clinical  information sent to [facility] at [facility] via PL.. CSW contacted Toniann Fail at facility and left message re: clinical information sent via PL. 04/28/12: Patient discharged to [facility] nursing facility today with husband transporting. Discharge information forwarded to facility.

## 2012-04-24 DIAGNOSIS — F29 Unspecified psychosis not due to a substance or known physiological condition: Secondary | ICD-10-CM

## 2012-04-24 DIAGNOSIS — G9340 Encephalopathy, unspecified: Secondary | ICD-10-CM

## 2012-04-24 DIAGNOSIS — G20 Parkinson's disease: Secondary | ICD-10-CM

## 2012-04-24 DIAGNOSIS — N179 Acute kidney failure, unspecified: Secondary | ICD-10-CM

## 2012-04-24 LAB — COMPREHENSIVE METABOLIC PANEL
Albumin: 2.8 g/dL — ABNORMAL LOW (ref 3.5–5.2)
Alkaline Phosphatase: 65 U/L (ref 39–117)
BUN: 40 mg/dL — ABNORMAL HIGH (ref 6–23)
Calcium: 10 mg/dL (ref 8.4–10.5)
GFR calc Af Amer: 26 mL/min — ABNORMAL LOW (ref >90)
Glucose, Bld: 109 mg/dL — ABNORMAL HIGH (ref 70–99)
Potassium: 4.1 mEq/L (ref 3.5–5.1)
Total Protein: 5.4 g/dL — ABNORMAL LOW (ref 6.0–8.3)

## 2012-04-24 LAB — GLUCOSE, CAPILLARY
Glucose-Capillary: 98 mg/dL (ref 70–99)
Glucose-Capillary: 125 mg/dL — ABNORMAL HIGH (ref 70–99)

## 2012-04-24 MED ORDER — METOPROLOL TARTRATE 25 MG PO TABS
25.0000 mg | ORAL_TABLET | Freq: Two times a day (BID) | ORAL | Status: DC
  Administered 2012-04-24 – 2012-04-28 (×7): 25 mg via ORAL
  Filled 2012-04-24 (×9): qty 1

## 2012-04-24 MED ORDER — SODIUM CHLORIDE 0.9 % IV SOLN
INTRAVENOUS | Status: DC
  Administered 2012-04-24 – 2012-04-25 (×4): via INTRAVENOUS

## 2012-04-24 NOTE — Progress Notes (Signed)
Subjective: Patient is more alert today. Pt alert and oriented to self and time. No complaints.  Objective: Vital signs in last 24 hours: Filed Vitals:   04/23/12 1701 04/23/12 2107 04/24/12 0617 04/24/12 0858  BP: 170/72 165/74 148/83 185/75  Pulse: 53 60 45 56  Temp: 97.7 F (36.5 C) 98.2 F (36.8 C) 97.3 F (36.3 C) 97.5 F (36.4 C)  TempSrc: Oral Oral Oral Oral  Resp: 18 18 18 18   Height:  4\' 6"  (1.372 m)    Weight:  50.2 kg (110 lb 10.7 oz)    SpO2: 97% 98% 97% 100%    Intake/Output Summary (Last 24 hours) at 04/24/12 1109 Last data filed at 04/24/12 0800  Gross per 24 hour  Intake    240 ml  Output   1650 ml  Net  -1410 ml    Weight change: 0.9 kg (1 lb 15.7 oz)  General: Alert, awake, oriented x3, in no acute distress. HEENT: No bruits,. Right ecchymotic area around the eye. Heart: Regular rate and rhythm, without murmurs, rubs, gallops. Lungs: Clear to auscultation bilaterally. Abdomen: Soft, nontender, nondistended, positive bowel sounds. Extremities: No clubbing cyanosis or edema with positive pedal pulses.    Lab Results:  Basename 04/24/12 0516 04/23/12 0525 04/21/12 2233  NA 139 142 --  K 4.1 3.8 --  CL 107 108 --  CO2 21 23 --  GLUCOSE 109* 106* --  BUN 40* 48* --  CREATININE 1.95* 2.41* --  CALCIUM 10.0 10.9* --  MG -- -- 2.2  PHOS -- -- 4.0    Basename 04/24/12 0516 04/23/12 0525 04/22/12 0520  AST 16 -- 25  ALT 5 -- 11  ALKPHOS 65 -- 82  BILITOT 0.4 -- 0.5  PROT 5.4* -- 6.4  ALBUMIN 2.8* 3.2* --   No results found for this basename: LIPASE:2,AMYLASE:2 in the last 72 hours  Basename 04/23/12 0525 04/22/12 0520  WBC 6.8 8.7  NEUTROABS -- --  HGB 11.2* 11.4*  HCT 33.6* 34.4*  MCV 85.7 85.8  PLT 274 293   No results found for this basename: CKTOTAL:3,CKMB:3,CKMBINDEX:3,TROPONINI:3 in the last 72 hours No components found with this basename: POCBNP:3 No results found for this basename: DDIMER:2 in the last 72 hours  Basename  04/21/12 2233 04/21/12 1506  HGBA1C 6.3* 6.4*    Basename 04/22/12 0520  CHOL 156  HDL 69  LDLCALC 74  TRIG 64  CHOLHDL 2.3  LDLDIRECT --    Basename 04/21/12 2233  TSH 3.550  T4TOTAL --  T3FREE --  THYROIDAB --   No results found for this basename: VITAMINB12:2,FOLATE:2,FERRITIN:2,TIBC:2,IRON:2,RETICCTPCT:2 in the last 72 hours  Micro Results: No results found for this or any previous visit (from the past 240 hour(s)).  Studies/Results: No results found.  Medications:     . amantadine  100 mg Oral Daily  . aspirin  325 mg Oral Daily  . beta carotene w/minerals  1 tablet Oral Daily  . carbidopa-levodopa  0.5 tablet Oral QHS  . carbidopa-levodopa  1 tablet Oral Daily  . clonazePAM  0.5 mg Oral QHS  . docusate sodium  100 mg Oral BID  . feeding supplement  237 mL Oral Q1500  . heparin  5,000 Units Subcutaneous Q8H  . insulin aspart  0-9 Units Subcutaneous TID WC  . metoprolol tartrate  12.5 mg Oral BID  . mirabegron ER  50 mg Oral Daily  . omega-3 acid ethyl esters  2 g Oral QHS  . pantoprazole  40 mg  Oral Q0600  . polyethylene glycol  17 g Oral Daily  . pramipexole  0.125 mg Oral TID  . sodium chloride  3 mL Intravenous Q12H  . DISCONTD: carbidopa-levodopa  1 tablet Oral q morning - 10a  . DISCONTD: NONFORMULARY OR COMPOUNDED ITEM 0.5 each  0.5 each Oral QHS    Assessment: Principal Problem:  *Encephalopathy acute Active Problems:  Hypercalcemia  Renal failure (ARF), acute on chronic  Dehydration  HTN (hypertension)  Hyperlipidemia  DM (diabetes mellitus)  Parkinson's disease  CAD (coronary artery disease)  Barrett esophagus  PUD (peptic ulcer disease)  Anxiety  Fibromyalgia  Falls  Hypokalemia   Plan: #1 acute encephalopathy  Likely secondary to hypercalcemia with a calcium level of 13 on admission. Clinical improvement. Patient is on oral calcium supplements and vitamin D. Per husband patient had a colonoscopy and a mammogram done however not  sure of the actual results but states it was not abnormal. Patient does have altered mental status, constipation, dehydration. CT of the head was negative. Chest x-ray which was done was negative. MRI was incomplete however no acute findings. Ionized calcium pending. intact PTH WNL. Phosphorus WNL. Magnesium WNL.  Vitamin D pending, TSH WNL. improving clinically. Corrected calcium level today is 11.0. Continue to hold patient's oral vitamin D supplements and oral calcium supplements. Decrease fluids 75cc/hour. Will hold patient's Lasix. S/P IV pamidronate. Follow. Neurology ffng and appreciate input and recommendations. #2 hypercalcemia  Questionable etiology. May be secondary to exogenous calcium supplementation. Per husband patient has had a colonoscopy and a mammogram done not sure exactly how far back however states that were not abnormal. Chest x-ray which was done was negative. CT of the head which was done was negative. MRI of the head negative, TSH WNL, phosphorus  within normal limits,  magnesium level within normal limits,  ionized calcium pending,  intact PTH WNL.  Corrected calcium is 11.0 from 13. Slowly improving clinically but not yet at baseline. dECREASE IVF 75cc/h hour. Holding patient's Lasix. Status post IV pamidronate.Follow.  #3 acute on chronic renal failure  Patient's last creatinine from 03/28/2011 was 1.54. I suspect patient does have a baseline chronic kidney disease. Patient's creatinine on admission was 3.50. Likely secondary to a prerenal azotemia secondary to dehydration, in the setting of diuretics. Renal function slowly improving and creatinine today at 1.95. Renal ultrasound negative for hydronephrosis. Continue Foley catheter. Decrease fluids to 75cc/h  and follow. Monitor closely for volume overload as patient does have an EF of less than 50%. Continue to hold patient's Lasix.  #4 Parkinson's disease  Per family patient has had a worsening of her ataxia and increasing the  tremors after her medications were changed by her neurologist. Will continue patient carbidopa and levodopa as well as her amantadine, and Mirapex. Aricept was discontinued per neurology. PT OT. Neurology following and appreciate input and recommendations #5 anxiety  Continue home dose clonazepam.  #6 hyperlipidemia  LDL is 74. #7 hypertension  Patient's systolic blood pressure in the ED is elevated. Patient does not seem to be on any antihypertensive medications from her med rec list. Increase Lopressor to 25mg  BID and titrate as tolerated.  #8 history of peptic ulcer disease  PPI.  #9 well controlled type 2 diabetes  Hemoglobin A1c = 6.4. Continue sliding scale insulin.  #10 history of coronary artery disease  Stable. Continue  beta blocker. Continue home dose aspirin. Will hold patient's Lasix secondary to problem #1 and will resume once patient is euvolemic. #11  hypokalemia Replete. # 12 falls Likely secondary to deteriorating Parkinson's disease. Continue current regimen. PT /OT.  #13 prophylaxis  PPI for GI prophylaxis, heparin for DVT prophylaxis.  #14 CODE STATUS  DO NOT RESUSCITATE.    LOS: 3 days   [name] [phone] 04/24/2012, 11:09 AM

## 2012-04-24 NOTE — Progress Notes (Signed)
Subjective: Patient continues to be alert.  Minimal tremor noted.  Up in bed feeding herself.  Objective: Current vital signs: BP 185/75  Pulse 56  Temp 97.5 F (36.4 C) (Oral)  Resp 18  Ht 4\' 6"  (1.372 m)  Wt 50.2 kg (110 lb 10.7 oz)  BMI 26.68 kg/m2  SpO2 100% Vital signs in last 24 hours: Temp:  [97.3 F (36.3 C)-98.2 F (36.8 C)] 97.5 F (36.4 C) (06/22 0858) Pulse Rate:  [45-60] 56  (06/22 0858) Resp:  [18] 18  (06/22 0858) BP: (148-187)/(72-83) 185/75 mmHg (06/22 0858) SpO2:  [97 %-100 %] 100 % (06/22 0858) Weight:  [50.2 kg (110 lb 10.7 oz)] 50.2 kg (110 lb 10.7 oz) (06/21 2107)  Intake/Output from previous day: 06/21 0701 - 06/22 0700 In: 360 [P.O.:360] Out: 2150 [Urine:2150] Intake/Output this shift: Total I/O In: 120 [P.O.:120] Out: -  Nutritional status: Cardiac  Neurologic Exam: Mental Status: Alert and alert.  Knows she is in the hospital.  Knows what year it is.  Reports that it is July.  Speech fluent without evidence of aphasia.  Able to follow 3 step commands. Cranial Nerves: II: visual fields grossly normal, pupils equal, round, reactive to light and accommodation III,IV, VI: ptosis not present, extra-ocular motions intact bilaterally V,VII: smile symmetric, facial light touch sensation normal bilaterally VIII: hearing normal bilaterally IX,X: gag reflex present XI: trapezius strength/neck flexion strength normal bilaterally XII: tongue strength normal  Motor: Right : Upper extremity   5/5    Left:     Upper extremity   5/5  Lower extremity   5/5     Lower extremity   5/5 Tone and bulk:normal tone throughout; no atrophy noted Sensory: Pinprick and light touch intact throughout, bilaterally Deep Tendon Reflexes: 2+ and symmetric throughout Plantars: Right: upgoing   Left: upgoing   Lab Results: Results for orders placed during the hospital encounter of 04/21/12 (from the past 48 hour(s))  GLUCOSE, CAPILLARY     Status: Abnormal   Collection  Time   04/22/12 11:35 AM      Component Value Range Comment   Glucose-Capillary 128 (*) 70 - 99 mg/dL    Comment 1 Documented in Chart     GLUCOSE, CAPILLARY     Status: Abnormal   Collection Time   04/22/12  4:57 PM      Component Value Range Comment   Glucose-Capillary 120 (*) 70 - 99 mg/dL    Comment 1 Documented in Chart      Comment 2 Notify RN     GLUCOSE, CAPILLARY     Status: Abnormal   Collection Time   04/22/12  9:12 PM      Component Value Range Comment   Glucose-Capillary 139 (*) 70 - 99 mg/dL   BASIC METABOLIC PANEL     Status: Abnormal   Collection Time   04/23/12  5:25 AM      Component Value Range Comment   Sodium 142  135 - 145 mEq/L    Potassium 3.8  3.5 - 5.1 mEq/L    Chloride 108  96 - 112 mEq/L    CO2 23  19 - 32 mEq/L    Glucose, Bld 106 (*) 70 - 99 mg/dL    BUN 48 (*) 6 - 23 mg/dL    Creatinine, Ser 6.57 (*) 0.50 - 1.10 mg/dL    Calcium 84.6 (*) 8.4 - 10.5 mg/dL    GFR calc non Af Amer 18 (*) >90 mL/min  GFR calc Af Amer 20 (*) >90 mL/min   CBC     Status: Abnormal   Collection Time   04/23/12  5:25 AM      Component Value Range Comment   WBC 6.8  4.0 - 10.5 K/uL    RBC 3.92  3.87 - 5.11 MIL/uL    Hemoglobin 11.2 (*) 12.0 - 15.0 g/dL    HCT 16.1 (*) 09.6 - 46.0 %    MCV 85.7  78.0 - 100.0 fL    MCH 28.6  26.0 - 34.0 pg    MCHC 33.3  30.0 - 36.0 g/dL    RDW 04.5  40.9 - 81.1 %    Platelets 274  150 - 400 K/uL   ALBUMIN     Status: Abnormal   Collection Time   04/23/12  5:25 AM      Component Value Range Comment   Albumin 3.2 (*) 3.5 - 5.2 g/dL   GLUCOSE, CAPILLARY     Status: Normal   Collection Time   04/23/12  7:47 AM      Component Value Range Comment   Glucose-Capillary 82  70 - 99 mg/dL    Comment 1 Documented in Chart     GLUCOSE, CAPILLARY     Status: Abnormal   Collection Time   04/23/12 11:59 AM      Component Value Range Comment   Glucose-Capillary 117 (*) 70 - 99 mg/dL    Comment 1 Documented in Chart     GLUCOSE, CAPILLARY      Status: Abnormal   Collection Time   04/23/12  4:51 PM      Component Value Range Comment   Glucose-Capillary 111 (*) 70 - 99 mg/dL   GLUCOSE, CAPILLARY     Status: Abnormal   Collection Time   04/23/12  9:06 PM      Component Value Range Comment   Glucose-Capillary 128 (*) 70 - 99 mg/dL   COMPREHENSIVE METABOLIC PANEL     Status: Abnormal   Collection Time   04/24/12  5:16 AM      Component Value Range Comment   Sodium 139  135 - 145 mEq/L    Potassium 4.1  3.5 - 5.1 mEq/L    Chloride 107  96 - 112 mEq/L    CO2 21  19 - 32 mEq/L    Glucose, Bld 109 (*) 70 - 99 mg/dL    BUN 40 (*) 6 - 23 mg/dL    Creatinine, Ser 9.14 (*) 0.50 - 1.10 mg/dL    Calcium 78.2  8.4 - 10.5 mg/dL    Total Protein 5.4 (*) 6.0 - 8.3 g/dL    Albumin 2.8 (*) 3.5 - 5.2 g/dL    AST 16  0 - 37 U/L    ALT 5  0 - 35 U/L    Alkaline Phosphatase 65  39 - 117 U/L    Total Bilirubin 0.4  0.3 - 1.2 mg/dL    GFR calc non Af Amer 23 (*) >90 mL/min    GFR calc Af Amer 26 (*) >90 mL/min     No results found for this or any previous visit (from the past 240 hour(s)).  Lipid Panel  Basename 04/22/12 0520  CHOL 156  TRIG 64  HDL 69  CHOLHDL 2.3  VLDL 13  LDLCALC 74    Studies/Results: No results found.  Medications:  I have reviewed the patient's current medications. Scheduled:   . amantadine  100 mg Oral  Daily  . aspirin  325 mg Oral Daily  . beta carotene w/minerals  1 tablet Oral Daily  . carbidopa-levodopa  0.5 tablet Oral QHS  . carbidopa-levodopa  1 tablet Oral Daily  . clonazePAM  0.5 mg Oral QHS  . docusate sodium  100 mg Oral BID  . feeding supplement  237 mL Oral Q1500  . heparin  5,000 Units Subcutaneous Q8H  . insulin aspart  0-9 Units Subcutaneous TID WC  . metoprolol tartrate  12.5 mg Oral BID  . mirabegron ER  50 mg Oral Daily  . omega-3 acid ethyl esters  2 g Oral QHS  . pantoprazole  40 mg Oral Q0600  . polyethylene glycol  17 g Oral Daily  . pramipexole  0.125 mg Oral TID  .  sodium chloride  3 mL Intravenous Q12H  . DISCONTD: carbidopa-levodopa  1 tablet Oral q morning - 10a  . DISCONTD: NONFORMULARY OR COMPOUNDED ITEM 0.5 each  0.5 each Oral QHS    Assessment/Plan:  Patient Active Hospital Problem List: Encephalopathy acute (04/21/2012)   Assessment: Improved    Plan:  1.  Agree with continued addressing of medical issues. Parkinson's disease (04/21/2012)   Assessment: Appears reasonably well controlled symptomatically at this point.     Plan:  1.  Continue current medications.       LOS: 3 days   Thana Farr, MD [facility] [phone] 04/24/2012  9:26 AM

## 2012-04-25 DIAGNOSIS — G9340 Encephalopathy, unspecified: Secondary | ICD-10-CM

## 2012-04-25 DIAGNOSIS — K5900 Constipation, unspecified: Secondary | ICD-10-CM | POA: Insufficient documentation

## 2012-04-25 DIAGNOSIS — N179 Acute kidney failure, unspecified: Secondary | ICD-10-CM

## 2012-04-25 DIAGNOSIS — G20 Parkinson's disease: Secondary | ICD-10-CM

## 2012-04-25 LAB — COMPREHENSIVE METABOLIC PANEL
ALT: <5 U/L (ref 0–35)
AST: 18 U/L (ref 0–37)
Alkaline Phosphatase: 67 U/L (ref 39–117)
CO2: 21 mEq/L (ref 19–32)
Chloride: 112 mEq/L (ref 96–112)
GFR calc non Af Amer: 25 mL/min — ABNORMAL LOW (ref >90)
Sodium: 142 mEq/L (ref 135–145)
Total Bilirubin: 0.3 mg/dL (ref 0.3–1.2)

## 2012-04-25 LAB — CBC
MCHC: 32.5 g/dL (ref 30.0–36.0)
Platelets: 262 10*3/uL (ref 150–400)
RDW: 15.4 % (ref 11.5–15.5)
WBC: 5.8 10*3/uL (ref 4.0–10.5)

## 2012-04-25 LAB — GLUCOSE, CAPILLARY: Glucose-Capillary: 119 mg/dL — ABNORMAL HIGH (ref 70–99)

## 2012-04-25 MED ORDER — POLYETHYLENE GLYCOL 3350 17 G PO PACK
17.0000 g | PACK | Freq: Two times a day (BID) | ORAL | Status: DC
  Administered 2012-04-26 – 2012-04-28 (×5): 17 g via ORAL
  Filled 2012-04-25 (×7): qty 1

## 2012-04-25 MED ORDER — POLYETHYLENE GLYCOL 3350 17 G PO PACK: 17.0000 g | PACK | Freq: Every day | ORAL | Status: DC

## 2012-04-25 MED ORDER — DOCUSATE SODIUM 100 MG PO CAPS: 100.0000 mg | ORAL_CAPSULE | Freq: Two times a day (BID) | ORAL | Status: DC

## 2012-04-25 NOTE — Progress Notes (Signed)
Subjective: Patient is more alert today. Pt alert and oriented to self and time. No complaints. Patient sitting in chair.  Objective: Vital signs in last 24 hours: Filed Vitals:   04/24/12 1700 04/24/12 2038 04/25/12 0511 04/25/12 1000  BP: 177/65 197/78 162/76 157/89  Pulse: 57 60 49 61  Temp: 98.3 F (36.8 C) 97.5 F (36.4 C) 97.8 F (36.6 C) 98.2 F (36.8 C)  TempSrc: Oral Oral Oral Oral  Resp: 18 18 16 16   Height:      Weight:  50 kg (110 lb 3.7 oz)    SpO2: 99% 95% 95% 96%    Intake/Output Summary (Last 24 hours) at 04/25/12 1140 Last data filed at 04/25/12 0900  Gross per 24 hour  Intake 2151.67 ml  Output   1201 ml  Net 950.67 ml    Weight change: -0.2 kg (-7.1 oz)  General: Alert, awake, oriented x3, in no acute distress. HEENT: No bruits,. Right ecchymotic area around the eye. Heart: Regular rate and rhythm, without murmurs, rubs, gallops. Lungs: Clear to auscultation bilaterally. Abdomen: Soft, nontender, nondistended, positive bowel sounds. Extremities: No clubbing cyanosis or edema with positive pedal pulses.    Lab Results:  [facility] 04/25/12 0505 04/24/12 0516  NA 142 139  K 4.0 4.1  CL 112 107  CO2 21 21  GLUCOSE 65* 109*  BUN 33* 40*  CREATININE 1.79* 1.95*  CALCIUM 9.5 10.0  MG -- --  PHOS -- --    Basename 04/25/12 0505 04/24/12 0516  AST 18 16  ALT <5 5  ALKPHOS 67 65  BILITOT 0.3 0.4  PROT 5.5* 5.4*  ALBUMIN 2.8* 2.8*   No results found for this basename: LIPASE:2,AMYLASE:2 in the last 72 hours  Basename 04/25/12 0505 04/23/12 0525  WBC 5.8 6.8  NEUTROABS -- --  HGB 10.5* 11.2*  HCT 32.3* 33.6*  MCV 87.3 85.7  PLT 262 274   No results found for this basename: CKTOTAL:3,CKMB:3,CKMBINDEX:3,TROPONINI:3 in the last 72 hours No components found with this basename: POCBNP:3 No results found for this basename: DDIMER:2 in the last 72 hours No results found for this basename: HGBA1C:2 in the last 72 hours No results found for  this basename: CHOL:2,HDL:2,LDLCALC:2,TRIG:2,CHOLHDL:2,LDLDIRECT:2 in the last 72 hours No results found for this basename: TSH,T4TOTAL,FREET3,T3FREE,THYROIDAB in the last 72 hours No results found for this basename: VITAMINB12:2,FOLATE:2,FERRITIN:2,TIBC:2,IRON:2,RETICCTPCT:2 in the last 72 hours  Micro Results: No results found for this or any previous visit (from the past 240 hour(s)).  Studies/Results: No results found.  Medications:     . amantadine  100 mg Oral Daily  . aspirin  325 mg Oral Daily  . beta carotene w/minerals  1 tablet Oral Daily  . carbidopa-levodopa  0.5 tablet Oral QHS  . carbidopa-levodopa  1 tablet Oral Daily  . clonazePAM  0.5 mg Oral QHS  . docusate sodium  100 mg Oral BID  . docusate sodium  100 mg Oral BID  . feeding supplement  237 mL Oral Q1500  . heparin  5,000 Units Subcutaneous Q8H  . insulin aspart  0-9 Units Subcutaneous TID WC  . metoprolol tartrate  25 mg Oral BID  . mirabegron ER  50 mg Oral Daily  . omega-3 acid ethyl esters  2 g Oral QHS  . pantoprazole  40 mg Oral Q0600  . polyethylene glycol  17 g Oral BID  . pramipexole  0.125 mg Oral TID  . sodium chloride  3 mL Intravenous Q12H  . DISCONTD: polyethylene glycol  17 g Oral Daily  . DISCONTD: polyethylene glycol  17 g Oral Daily    Assessment: Principal Problem:  *Encephalopathy acute Active Problems:  Hypercalcemia  Renal failure (ARF), acute on chronic  Dehydration  HTN (hypertension)  Hyperlipidemia  DM (diabetes mellitus)  Parkinson's disease  CAD (coronary artery disease)  Barrett esophagus  PUD (peptic ulcer disease)  Anxiety  Fibromyalgia  Falls  Hypokalemia   Plan: #1 acute encephalopathy  Likely secondary to hypercalcemia with a calcium level of 13 on admission. Clinical improvement. Patient is on oral calcium supplements and vitamin D. Per husband patient had a colonoscopy and a mammogram done however not sure of the actual results but states it was not  abnormal. Patient does have altered mental status, constipation, dehydration. CT of the head was negative. Chest x-ray which was done was negative. MRI was incomplete however no acute findings. Ionized calcium pending. intact PTH WNL. Phosphorus WNL. Magnesium WNL.  Vitamin D pending, TSH WNL. improving clinically. Corrected calcium level today is 10.5. Continue to hold patient's oral vitamin D supplements and oral calcium supplements. Decrease fluids 50cc/hour. Will hold patient's Lasix. S/P IV pamidronate. Follow. Neurology ffng and appreciate input and recommendations. #2 hypercalcemia  Questionable etiology. May be secondary to exogenous calcium supplementation. Per husband patient has had a colonoscopy and a mammogram done not sure exactly how far back however states that were not abnormal. Chest x-ray which was done was negative. CT of the head which was done was negative. MRI of the head negative, TSH WNL, phosphorus  within normal limits,  magnesium level within normal limits,  ionized calcium pending,  intact PTH WNL.  Corrected calcium is 10.5 from 13. Slowly improving clinically but not yet at baseline. dECREASE IVF 50cc/h hour. Holding patient's Lasix. Status post IV pamidronate.Follow.  #3 acute on chronic renal failure  Patient's last creatinine from 03/28/2011 was 1.54. I suspect patient does have a baseline chronic kidney disease. Patient's creatinine on admission was 3.50. Likely secondary to a prerenal azotemia secondary to dehydration, in the setting of diuretics. Renal function slowly improving and creatinine today at 1.79. Renal ultrasound negative for hydronephrosis. D/C Foley catheter. Decrease fluids to 50cc/h  and follow. Monitor closely for volume overload as patient does have an EF of less than 50%. Continue to hold patient's Lasix.  #4 Parkinson's disease  Per family patient has had a worsening of her ataxia and increasing the tremors after her medications were changed by her  neurologist. Will continue patient carbidopa and levodopa as well as her amantadine, and Mirapex. Aricept was discontinued per neurology. PT OT. Neurology following and appreciate input and recommendations #5 anxiety  Continue home dose clonazepam.  #6 hyperlipidemia  LDL is 74. #7 hypertension  Patient's systolic blood pressure in the ED is elevated. Patient does not seem to be on any antihypertensive medications from her med rec list. Continue Lopressor to 25mg  BID and titrate as tolerated.  #8 history of peptic ulcer disease  PPI.  #9 well controlled type 2 diabetes  Hemoglobin A1c = 6.4. Continue sliding scale insulin.  #10 history of coronary artery disease  Stable. Continue  beta blocker. Continue home dose aspirin. Will hold patient's Lasix secondary to problem #1 and will resume once patient is euvolemic. #11 hypokalemia Replete. # 12 falls Likely secondary to deteriorating Parkinson's disease. Continue current regimen. PT /OT.  #13 Constipation Will increase miralax to BID. Add Colace 100mg  BID. #14 prophylaxis  PPI for GI prophylaxis, heparin for DVT prophylaxis.  #  15 CODE STATUS  DO NOT RESUSCITATE.    LOS: 4 days   Boysie Bonebrake [phone] 04/25/2012, 11:40 AM

## 2012-04-25 NOTE — Progress Notes (Signed)
SLP Cancellation Note  ST received order for BSE.  Evaluation deferred to 04/26/12. Moreen Fowler M.S. CCC-SLP [phone]  [facility] 04/25/2012, 3:44 PM

## 2012-04-26 DIAGNOSIS — G20 Parkinson's disease: Secondary | ICD-10-CM

## 2012-04-26 DIAGNOSIS — G9340 Encephalopathy, unspecified: Secondary | ICD-10-CM

## 2012-04-26 DIAGNOSIS — N179 Acute kidney failure, unspecified: Secondary | ICD-10-CM

## 2012-04-26 LAB — COMPREHENSIVE METABOLIC PANEL
ALT: 6 U/L (ref 0–35)
AST: 19 U/L (ref 0–37)
Albumin: 3.5 g/dL (ref 3.5–5.2)
Alkaline Phosphatase: 82 U/L (ref 39–117)
Chloride: 108 mEq/L (ref 96–112)
Creatinine, Ser: 1.65 mg/dL — ABNORMAL HIGH (ref 0.50–1.10)
Potassium: 4.2 mEq/L (ref 3.5–5.1)
Sodium: 141 mEq/L (ref 135–145)
Total Bilirubin: 0.3 mg/dL (ref 0.3–1.2)

## 2012-04-26 LAB — VITAMIN D 1,25 DIHYDROXY
Vitamin D 1, 25 (OH)2 Total: 28 pg/mL (ref 18–72)
Vitamin D2 1, 25 (OH)2: <8 pg/mL
Vitamin D3 1, 25 (OH)2: 28 pg/mL

## 2012-04-26 LAB — GLUCOSE, CAPILLARY
Glucose-Capillary: 65 mg/dL — ABNORMAL LOW (ref 70–99)
Glucose-Capillary: 64 mg/dL — ABNORMAL LOW (ref 70–99)
Glucose-Capillary: 126 mg/dL — ABNORMAL HIGH (ref 70–99)

## 2012-04-26 MED ORDER — HALOPERIDOL LACTATE 5 MG/ML IJ SOLN
0.5000 mg | Freq: Three times a day (TID) | INTRAMUSCULAR | Status: DC | PRN
  Administered 2012-04-27: 0.5 mg via INTRAMUSCULAR
  Filled 2012-04-26: qty 1

## 2012-04-26 MED ORDER — WHITE PETROLATUM GEL
Status: AC
  Filled 2012-04-26: qty 5

## 2012-04-26 MED ORDER — HALOPERIDOL 0.5 MG PO TABS
0.5000 mg | ORAL_TABLET | Freq: Three times a day (TID) | ORAL | Status: DC | PRN
  Filled 2012-04-26: qty 1

## 2012-04-26 MED ORDER — SODIUM CHLORIDE 0.9 % IV SOLN
INTRAVENOUS | Status: DC
  Administered 2012-04-26: 14:00:00 via INTRAVENOUS
  Administered 2012-04-27: 1000 mL via INTRAVENOUS

## 2012-04-26 NOTE — Evaluation (Signed)
Clinical/Bedside Swallow Evaluation Patient Details  Name: PERNELLA ACKERLEY MRN: [id number] Date of Birth: October 20, 1928  Today's Date: 04/26/2012 Time: 0941-     Past Medical History:  Past Medical History  Diagnosis Date  . MI, old   . Parkinson disease   . Scoliosis   . Arthritis   . Bladder cancer   . HTN (hypertension) 04/21/2012  . Hyperlipidemia 04/21/2012  . DM (diabetes mellitus) 04/21/2012  . CAD (coronary artery disease) 04/21/2012  . Barrett esophagus 04/21/2012  . PUD (peptic ulcer disease) 04/21/2012  . Adrenal insufficiency 04/21/2012  . Raynaud's disease 04/21/2012  . Anxiety 04/21/2012  . Fibromyalgia 04/21/2012  . Hx of bladder cancer 04/21/2012  . Edema of lower extremity 04/21/2012    Chronic lower extremity edema  . Falls 04/21/2012   Past Surgical History:  Past Surgical History  Procedure Date  . Tonsillectomy   . Appendectomy   . Abdominal hysterectomy    HPI:  Mamta Rimmer is a 78 year old Caucasian female with history of Parkinson's disease, hypertension, hyperlipidemia, diabetes, coronary artery disease, Barrett's esophagus, history of peptic ulcer disease, prior history of adrenal insufficiency which has since resolved per family, who presents to the ED with a 3 week history of worsening confusion and falls Likely secondary to hypercalcemia with a calcium level of 13 on admission. Pt has shown improvement in hospital. Pt and husband report pt occasionally has the feeling of food getting stuck. She eats a regular diet and can chew meats. Her history reports Barretts esophagus. Pt/hunsband were unaware of this.    Assessment / Plan / Recommendation Clinical Impression  Pt did not demonstrate any s/s of aspiraiton. Remote reports of esophageal dysphagia, no evidence of that today. Pt tolerate PO intake without difficulty. SLp provided esophageal precautions to pt and husband. No SLP f/u needed.     Aspiration Risk  None    Diet Recommendation Regular;Thin liquid    Liquid Administration via: Cup;Straw Medication Administration: Whole meds with liquid    Other  Recommendations     Follow Up Recommendations       Frequency and Duration        Pertinent Vitals/Pain NA    SLP Swallow Goals     Swallow Study Prior Functional Status       General HPI: Maryse Brierley is a 78 year old Caucasian female with history of Parkinson's disease, hypertension, hyperlipidemia, diabetes, coronary artery disease, Barrett's esophagus, history of peptic ulcer disease, prior history of adrenal insufficiency which has since resolved per family, who presents to the ED with a 3 week history of worsening confusion and falls Likely secondary to hypercalcemia with a calcium level of 13 on admission. Pt has shown improvement in hospital. Pt and husband report pt occasionally has the feeling of food getting stuck. She eats a regular diet and can chew meats. Her history reports Barretts esophagus. Pt/hunsband were unaware of this.  Type of Study: Bedside swallow evaluation Diet Prior to this Study: Regular;Thin liquids Temperature Spikes Noted: No Respiratory Status: Room air Behavior/Cognition: Alert;Cooperative;Pleasant mood;Confused Oral Cavity - Dentition: Adequate natural dentition Self-Feeding Abilities: Able to feed self Patient Positioning: Upright in chair Baseline Vocal Quality: Clear Volitional Cough: Strong Volitional Swallow: Able to elicit    Oral/Motor/Sensory Function Overall Oral Motor/Sensory Function: Appears within functional limits for tasks assessed   Ice Chips     Thin Liquid Thin Liquid: Within functional limits    Nectar Thick     Honey Thick     Puree Puree:  Within functional limits   Solid Solid: Within functional limits    Kanija Remmel, Riley Nearing 04/26/2012,10:20 AM

## 2012-04-26 NOTE — Progress Notes (Signed)
CBG: 64  Treatment: 15 GM carbohydrate snack  Symptoms: None  Follow-up CBG: Time:1310 CBG Result:88  Possible Reasons for Event: Inadequate meal intake and Unknown  Comments/MD notified:

## 2012-04-26 NOTE — Progress Notes (Signed)
CBG:65 Treatment: 15 GM carbohydrate snack  Symptoms: None  Follow-up CBG: Time:829  CBG Result:103   Possible Reasons for Event: Change in activity and Unknown  Comments/MD notified:

## 2012-04-26 NOTE — Progress Notes (Signed)
Physical Therapy Treatment Patient Details Name: [name] MRN: [id number] DOB: 10-Jul-1928 Today's Date: 04/26/2012 Time: 9147-8295 PT Time Calculation (min): 25 min  PT Assessment / Plan / Recommendation Comments on Treatment Session  Continued confusion/agitation and restlessness; Still increased amb today    Follow Up Recommendations  Skilled nursing facility;Supervision/Assistance - 24 hour;Supervision for mobility/OOB    Barriers to Discharge        Equipment Recommendations  Defer to next venue    Recommendations for Other Services    Frequency Min 3X/week   Plan Discharge plan remains appropriate    Precautions / Restrictions Precautions Precautions: Fall   Pertinent Vitals/Pain No reports of pain (however pt quite confused)    Mobility  Transfers Transfers: Sit to Stand;Stand to Sit Sit to Stand: 3: Mod assist;From chair/3-in-1;With armrests Stand to Sit: 1: +2 Total assist;To chair/3-in-1 Stand to Sit: Patient Percentage: 30% Details for Transfer Assistance: Pt initially impulsively standing from chair with mostly mod assist for balance and to translate her weight over her feet; By the time pt needed to sit, she was not participating and resistent to any suggestion from a Research scientist (physical sciences); For her safety, she was assisted to sit in the recliner (her hands still holding RW) Ambulation/Gait Ambulation/Gait Assistance: 1: +2 Total assist Ambulation/Gait: Patient Percentage: 70% Ambulation Distance (Feet): 60 Feet Assistive device: Rolling walker Ambulation/Gait Assistance Details: continued shuffled steps and kyphotic posture; mid-walk, pt stopped following commands/suggestions -- she adamantly refused changing her urine-soaked gown, and eventually refused to stop walking; This therapist hipes that the increased ambulation will help dissipate nervous energy, and possibly help pt to be more calm throughout the day Gait Pattern: Shuffle;Decreased step length -  right;Decreased step length - left;Decreased hip/knee flexion - right;Decreased hip/knee flexion - left;Decreased dorsiflexion - right;Decreased dorsiflexion - left Gait velocity: slowed    Exercises     PT Diagnosis:    PT Problem List:   PT Treatment Interventions:     PT Goals Acute Rehab PT Goals Time For Goal Achievement: 04/29/12 Potential to Achieve Goals: Fair Pt will go Sit to Stand: with supervision PT Goal: Sit to Stand - Progress: Progressing toward goal Pt will go Stand to Sit: with supervision PT Goal: Stand to Sit - Progress: Not progressing Pt will Ambulate: 16 - 50 feet;with min assist;with least restrictive assistive device PT Goal: Ambulate - Progress: Progressing toward goal  Visit Information  Last PT Received On: 04/26/12 Assistance Needed: +2    Subjective Data  Subjective: Pt quite confused; at times belligerant Patient Stated Goal: Unable to state   Cognition  Overall Cognitive Status: Impaired Area of Impairment: Safety/judgement;Awareness of deficits Arousal/Alertness: Awake/alert Orientation Level: Disoriented to;Place;Time;Situation Behavior During Session: Agitated Safety/Judgement: Decreased awareness of safety precautions;Decreased awareness of need for assistance Safety/Judgement - Other Comments: Unable to process/understand the need to change her gown which was soaked in the front with urine Awareness of Deficits: Poor self monitor for balance, need to sit down; Denies cues/commands to sit down for safety Cognition - Other Comments: Very confused today, refusing care (RNs working to get blood sugar when this therapist arrived); Pt initially agreeable and working with PT, then after about 15 minutes, she stopped following commands, ceased to be open to suggestion, and told this therapist to leave whenever we made eye contact    Balance     End of Session PT - End of Session Equipment Utilized During Treatment:  (pt declined gait  belt) Activity Tolerance: Treatment limited secondary  to agitation Patient left: with chair alarm set;in chair;with call bell/phone within reach;with nursing in room Nurse Communication: Mobility status    Van Clines [facility] [place],  [phone]  04/26/2012, 12:51 PM

## 2012-04-26 NOTE — Progress Notes (Signed)
Utilization review completed.  

## 2012-04-26 NOTE — Progress Notes (Signed)
Subjective: Patient is more alert today. Patient sitting in chair. Per patient's husband patient has been combative agitated delusional and still having hallucinations which she states is very different from her baseline. Patient husband feels that patient was improving earlier on during the hospitalization however he states that her delusions and hallucinations are the worst he has seen today.  Objective: Vital signs in last 24 hours: Filed Vitals:   04/26/12 0500 04/26/12 1000 04/26/12 1402 04/26/12 1846  BP: 179/83 160/61 155/66 172/63  Pulse: 77 49 50 93  Temp: 97.6 F (36.4 C) 97.8 F (36.6 C) 97.5 F (36.4 C) 98.3 F (36.8 C)  TempSrc: Oral Oral Oral Oral  Resp: 20 16 20 18   Height:      Weight:      SpO2: 99%  95% 93%    Intake/Output Summary (Last 24 hours) at 04/26/12 1905 Last data filed at 04/26/12 1332  Gross per 24 hour  Intake    600 ml  Output    100 ml  Net    500 ml    Weight change: 0.1 kg (3.5 oz)  General: Alert, awake,  in no acute distress. HEENT: No bruits,. Right ecchymotic area around the eye. Heart: Regular rate and rhythm, without murmurs, rubs, gallops. Lungs: Clear to auscultation bilaterally. Abdomen: Soft, nontender, nondistended, positive bowel sounds. Extremities: No clubbing cyanosis or edema with positive pedal pulses.    Lab Results:  [facility] 04/26/12 1453 04/25/12 0505  NA 141 142  K 4.2 4.0  CL 108 112  CO2 22 21  GLUCOSE 110* 65*  BUN 29* 33*  CREATININE 1.65* 1.79*  CALCIUM 9.7 9.5  MG -- --  PHOS -- --    Basename 04/26/12 1453 04/25/12 0505  AST 19 18  ALT 6 <5  ALKPHOS 82 67  BILITOT 0.3 0.3  PROT 6.6 5.5*  ALBUMIN 3.5 2.8*   No results found for this basename: LIPASE:2,AMYLASE:2 in the last 72 hours  Basename 04/25/12 0505  WBC 5.8  NEUTROABS --  HGB 10.5*  HCT 32.3*  MCV 87.3  PLT 262   No results found for this basename: CKTOTAL:3,CKMB:3,CKMBINDEX:3,TROPONINI:3 in the last 72 hours No components  found with this basename: POCBNP:3 No results found for this basename: DDIMER:2 in the last 72 hours No results found for this basename: HGBA1C:2 in the last 72 hours No results found for this basename: CHOL:2,HDL:2,LDLCALC:2,TRIG:2,CHOLHDL:2,LDLDIRECT:2 in the last 72 hours No results found for this basename: TSH,T4TOTAL,FREET3,T3FREE,THYROIDAB in the last 72 hours No results found for this basename: VITAMINB12:2,FOLATE:2,FERRITIN:2,TIBC:2,IRON:2,RETICCTPCT:2 in the last 72 hours  Micro Results: No results found for this or any previous visit (from the past 240 hour(s)).  Studies/Results: No results found.  Medications:     . amantadine  100 mg Oral Daily  . aspirin  325 mg Oral Daily  . beta carotene w/minerals  1 tablet Oral Daily  . carbidopa-levodopa  0.5 tablet Oral QHS  . carbidopa-levodopa  1 tablet Oral Daily  . clonazePAM  0.5 mg Oral QHS  . docusate sodium  100 mg Oral BID  . feeding supplement  237 mL Oral Q1500  . heparin  5,000 Units Subcutaneous Q8H  . insulin aspart  0-9 Units Subcutaneous TID WC  . metoprolol tartrate  25 mg Oral BID  . mirabegron ER  50 mg Oral Daily  . omega-3 acid ethyl esters  2 g Oral QHS  . pantoprazole  40 mg Oral Q0600  . polyethylene glycol  17 g Oral BID  .  pramipexole  0.125 mg Oral TID  . sodium chloride  3 mL Intravenous Q12H  . white petrolatum        Assessment: Principal Problem:  *Encephalopathy acute Active Problems:  Hypercalcemia  Renal failure (ARF), acute on chronic  Dehydration  HTN (hypertension)  Hyperlipidemia  DM (diabetes mellitus)  Parkinson's disease  CAD (coronary artery disease)  Barrett esophagus  PUD (peptic ulcer disease)  Anxiety  Fibromyalgia  Falls  Hypokalemia  Constipation   Plan: #1 acute encephalopathy  Likely secondary to hypercalcemia with a calcium level of 13 on admission. Clinical improvement. Patient is on oral calcium supplements and vitamin D. Per husband patient had a  colonoscopy and a mammogram done however not sure of the actual results but states it was not abnormal. Patient does have altered mental status, constipation, dehydration. CT of the head was negative. Chest x-ray which was done was negative. MRI was incomplete however no acute findings. Ionized calcium pending. intact PTH WNL. Phosphorus WNL. Magnesium WNL.  Vitamin D pending, TSH WNL. improving clinically. Corrected calcium level today is 10. Continue to hold patient's oral vitamin D supplements and oral calcium supplements. Decrease fluids 50cc/hour. Will hold patient's Lasix. S/P IV pamidronate. Follow. Neurology ffng and appreciate input and recommendations. Per husband patient more delusional with hallucinations today. Will consult with psychiatry for further evaluation and management. Patient has no signs or symptoms of acute infection. We'll place on Haldol as needed. #2 hypercalcemia  Questionable etiology. May be secondary to exogenous calcium supplementation. Per husband patient has had a colonoscopy and a mammogram done not sure exactly how far back however states that were not abnormal. Chest x-ray which was done was negative. CT of the head which was done was negative. MRI of the head negative, TSH WNL, phosphorus  within normal limits,  magnesium level within normal limits,  ionized calcium pending,  intact PTH WNL.  Corrected calcium is 10. from 13. Slowly improving clinically but not yet at baseline. Continue IVF 50cc/h hour. Holding patient's Lasix. Status post IV pamidronate.Follow.  #3 acute on chronic renal failure  Patient's last creatinine from 03/28/2011 was 1.54. I suspect patient does have a baseline chronic kidney disease. Patient's creatinine on admission was 3.50. Likely secondary to a prerenal azotemia secondary to dehydration, in the setting of diuretics. Renal function slowly improving and creatinine today at 1.65. Renal ultrasound negative for hydronephrosis. Continue fluids at  50cc/h  and follow. Monitor closely for volume overload as patient does have an EF of less than 50%. Continue to hold patient's Lasix.  #4 Parkinson's disease  Per family patient has had a worsening of her ataxia and increasing the tremors after her medications were changed by her neurologist. Will continue patient carbidopa and levodopa as well as her amantadine, and Mirapex. Aricept was discontinued per neurology. PT OT. Neurology following and appreciate input and recommendations #5 anxiety  Continue home dose clonazepam.  #6 hyperlipidemia  LDL is 74. #7 hypertension  Patient's systolic blood pressure in the ED was elevated. Patient does not seem to be on any antihypertensive medications from her med rec list. Continue Lopressor  25mg  BID and titrate as tolerated.  #8 history of peptic ulcer disease  PPI.  #9 well controlled type 2 diabetes  Hemoglobin A1c = 6.4. Continue sliding scale insulin.  #10 history of coronary artery disease  Stable. Continue  beta blocker. Continue home dose aspirin. Will hold patient's Lasix secondary to problem #1 and will resume once patient is euvolemic. #11  hypokalemia Replete. # 12 falls Likely secondary to deteriorating Parkinson's disease. Continue current regimen. PT /OT.  #13 Constipation Continue miralax  BID and Colace 100mg  BID. #14 prophylaxis  PPI for GI prophylaxis, heparin for DVT prophylaxis.  #15 CODE STATUS  DO NOT RESUSCITATE.    LOS: 5 days   Tomeko Scoville [phone] 04/26/2012, 7:05 PM

## 2012-04-27 DIAGNOSIS — F0280 Dementia in other diseases classified elsewhere without behavioral disturbance: Secondary | ICD-10-CM

## 2012-04-27 DIAGNOSIS — G20 Parkinson's disease: Secondary | ICD-10-CM

## 2012-04-27 DIAGNOSIS — N179 Acute kidney failure, unspecified: Secondary | ICD-10-CM

## 2012-04-27 DIAGNOSIS — R4182 Altered mental status, unspecified: Secondary | ICD-10-CM

## 2012-04-27 DIAGNOSIS — G20A1 Parkinson's disease without dyskinesia, without mention of fluctuations: Secondary | ICD-10-CM

## 2012-04-27 DIAGNOSIS — F29 Unspecified psychosis not due to a substance or known physiological condition: Secondary | ICD-10-CM

## 2012-04-27 DIAGNOSIS — G3183 Dementia with Lewy bodies: Secondary | ICD-10-CM

## 2012-04-27 DIAGNOSIS — G9340 Encephalopathy, unspecified: Secondary | ICD-10-CM

## 2012-04-27 LAB — GLUCOSE, CAPILLARY
Glucose-Capillary: 100 mg/dL — ABNORMAL HIGH (ref 70–99)
Glucose-Capillary: 91 mg/dL (ref 70–99)

## 2012-04-27 LAB — COMPREHENSIVE METABOLIC PANEL
ALT: 6 U/L (ref 0–35)
Albumin: 3.3 g/dL — ABNORMAL LOW (ref 3.5–5.2)
Alkaline Phosphatase: 79 U/L (ref 39–117)
BUN: 29 mg/dL — ABNORMAL HIGH (ref 6–23)
Chloride: 110 mEq/L (ref 96–112)
Glucose, Bld: 101 mg/dL — ABNORMAL HIGH (ref 70–99)
Potassium: 4 mEq/L (ref 3.5–5.1)
Sodium: 142 mEq/L (ref 135–145)
Total Bilirubin: 0.4 mg/dL (ref 0.3–1.2)

## 2012-04-27 LAB — SODIUM, URINE, RANDOM: Sodium, Ur: 97 mEq/L

## 2012-04-27 NOTE — Consult Note (Signed)
Clinical Social Work Department CLINICAL SOCIAL WORK PSYCHIATRY SERVICE LINE ASSESSMENT 04/27/2012  Patient:  [name]  Account:  [id number]  Admit Date:  04/21/2012  Clinical Social Worker:  Ashley Jacobs, LCSW  Date/Time:  04/27/2012 09:47 AM Referred by:  Physician  Date referred:  04/27/2012 Reason for Referral  Behavioral Health Issues   Presenting Symptoms/Problems (In the person's/family's own words):   Patient has been confused, agitated, delusional, and having visual hallucinations per chart review and interview of patient and husband.    Consult called for delusional behavior and medication managment of behaviors.  This is not patient's baseline per husband who verbalizes concern and worry over patient.    Husband reports patient has had 2 UTIs in the past in 2010 that caused her to begin declining with memory and ADL function levels.  Patient in the last month has had a serious decline with two falls and erratic behavior. Husband reports her baseline is happy, social, and fun loving.  Her friends refer to her as Ander Slade and that is how she reports she wants to be called.   Abuse/Neglect/Trauma History (check all that apply)  Denies history   Abuse/Neglect/Trauma Comments:   NONE   Psychiatric History (check all that apply)  Denies history   Psychiatric medications:  Patient has been started on Areciept and Exelon patches for depression.  Reports she also takes medications for Parkinson's   Current Mental Health Hospitalizations/Previous Mental Health History:   none reported   Current provider:   PCP  Neurologist  No Psychiatric MD or counselor   Place and Date:   NA   Current Medications:   Scheduled Meds:   . amantadine  100 mg Oral Daily  . aspirin  325 mg Oral Daily  . beta carotene w/minerals  1 tablet Oral Daily  . carbidopa-levodopa  0.5 tablet Oral QHS  . carbidopa-levodopa  1 tablet Oral Daily  . clonazePAM  0.5 mg Oral QHS  . docusate sodium  100  mg Oral BID  . feeding supplement  237 mL Oral Q1500  . heparin  5,000 Units Subcutaneous Q8H  . insulin aspart  0-9 Units Subcutaneous TID WC  . metoprolol tartrate  25 mg Oral BID  . mirabegron ER  50 mg Oral Daily  . omega-3 acid ethyl esters  2 g Oral QHS  . pantoprazole  40 mg Oral Q0600  . polyethylene glycol  17 g Oral BID  . pramipexole  0.125 mg Oral TID  . sodium chloride  3 mL Intravenous Q12H  . white petrolatum       Continuous Infusions:   . sodium chloride 50 mL/hr at 04/26/12 1332  . DISCONTD: sodium chloride 50 mL/hr at 04/25/12 1409   PRN Meds:.acetaminophen, acetaminophen, alum & mag hydroxide-simeth, bisacodyl, haloperidol, haloperidol lactate, hydrALAZINE, ondansetron (ZOFRAN) IV, ondansetron, traMADol  Previous Impatient Admission/Date/Reason:   None   Emotional Health / Current Symptoms    Suicide/Self Harm  None reported   Suicide attempt in the past:   None reported   Other harmful behavior:   none reported   Psychotic/Dissociative Symptoms  Delusional  Confusion  Paranoia  Disorganized Speech   Other Psychotic/Dissociative Symptoms:   Husband reports patient has been verbalizing fear that staff and providers are out to get her and she is afraid they are going to kill her.  Patient has also be having manic like episodes where she excessively talks about anything and everything, but in no order and making no sense.  This coorelates to delusions that her family (husband and son) are having to go to jail for three years and she is very fixated on this and worried.  She reports she has been seeing tiny bugs at times, (not all the time), but at home and while in hospital. Reports no voices or other hallucinations at this time.  Confusion as well as aggressive and combative behavior has also be charted and noticed, and husband reports yesterday was a bad day with these behaviors, however she is more herself this morning 6/24    Attention/Behavioral  Symptoms  Restless   Other Attention / Behavioral Symptoms:   Patient is lying in bed and able to tell CSW what she had for breakfast, where she currently was, and that she had fallen at home and hit her head.  Patient has a large hematoma on her forehead and a very large black eye around her right eye. patient is restless in the bed moving around and kicking her feet as if she cannot get comfortable.  She is calm at the present time with good eye contact and she is smiling. She does not seem distressed and she is well put together and not disheveled. She is alert and oriented x2 and able to recognize her husband and participate in some of the assessment this morning.  She does not appear depressed or tearful.  She is not internally preoccupied or experiencing AVH at this time. She is compliant with assessment and also with RN who takes her vitals during assessment.    Cognitive Impairment  Impairment due to current medical condition/treatment (stroke,reaction to medication,reaction to infections,etc)   Other Cognitive Impairment:   Her confusion per husband has improved as he is seeing her today compared to yesterday.  He is over joyed with her mental and physical state today.  Patient able to verbalize her needs and also participate in conversation, but she is slow to speak and answer questions.    Mood and Adjustment  Anxious    Stress, Anxiety, Trauma, Any Recent Loss/Stressor  Other - See comment   Anxiety (frequency):   Phobia (specify):   Compulsive behavior (specify):   Obsessive behavior (specify):   Other:   Husband reports they have just downsized their home and moved into a condo.  Reports the move was very stressful and patient continues to do more physically than she should be doing.  He reports a decline in her physical mobility and overall health.  Reports she has been depressed which related to her medical problems.   Substance Abuse/Use  None   SBIRT completed  (please refer for detailed history):  N  Self-reported substance use:   Declined any SA   Urinary Drug Screen Completed:  N Alcohol level:   NA    Environmental/Housing/Living Arrangement  Stable housing   Who is in the home:   Patient lives in a Condo at home with her husband   Emergency contact:  Husband: Chrissie Noa  Also has a son and a daughter in the area.  Son: Annette Stable  Daughter is [employer]   Patient's Strengths and Goals (patient's own words):   Patient has improved per husbands reports. Confusion and delusional behavior have declined.  He sees improvement   Clinical Social Worker's Interpretive Summary:   1. Will have Psych MD review medications for patient to help with any mood or aggressive behavior.  2. Patient and husband do not feel psychiatric follow up is necessary at  this time due to no previous history of MH or anticipating future problems. They fill PCP and other providers are fine to follow up with.  3. Patient is not SI, HI, or psychotic. She was very cooperative and excited to work with physical therapy today to get out of bed.  4. Disposition: SNF per husband. Feels she needs so rehab and also OT for continued independence at home.  5. Will follow up with psych MD and recommendations for patient.    Disposition:  Recommend Psych CSW continuing to support  Ashley Jacobs, MSW LCSW [phone]

## 2012-04-27 NOTE — Consult Note (Signed)
Patient Identification:  [name] Date of Evaluation:  04/27/2012 Reason for Consult:  Delusions  Referring Provider: Dr. Janee [name]   History of Present Illness:Pt says she was trying to lift the mat out of the shower when she fell and stuck a corner of the shower stall.  She is in the hospital after her husband took her to the ED.  Her CT scan report of a small periorbital hematoma was of no great concern.  She was sent home but her husband was not able to wake her the next morning.   They returned to [facility] and she was found to have an elevated creatinine.  She had no further complications of a small epidural bleed.   Past Psychiatric History:None reported.    Past Medical History:     Past Medical History  Diagnosis Date  . MI, old   . Parkinson disease   . Scoliosis   . Arthritis   . Bladder cancer   . HTN (hypertension) 04/21/2012  . Hyperlipidemia 04/21/2012  . DM (diabetes mellitus) 04/21/2012  . CAD (coronary artery disease) 04/21/2012  . Barrett esophagus 04/21/2012  . PUD (peptic ulcer disease) 04/21/2012  . Adrenal insufficiency 04/21/2012  . Raynaud's disease 04/21/2012  . Anxiety 04/21/2012  . Fibromyalgia 04/21/2012  . Hx of bladder cancer 04/21/2012  . Edema of lower extremity 04/21/2012    Chronic lower extremity edema  . Falls 04/21/2012       Past Surgical History  Procedure Date  . Tonsillectomy   . Appendectomy   . Abdominal hysterectomy     Allergies:  Allergies  Allergen Reactions  . Codeine Hives    Current Medications:  Prior to Admission medications   Medication Sig Start Date End Date Taking? Authorizing Provider  amantadine (SYMMETREL) 100 MG capsule Take 100 mg by mouth daily.    Yes Historical Provider, MD  aspirin 325 MG tablet Take 325 mg by mouth daily.   Yes Historical Provider, MD  CALCIUM-VITAMIN D PO Take 1 tablet by mouth 2 (two) times daily.   Yes Historical Provider, MD  carbidopa-levodopa (SINEMET IR) 25-100 MG per tablet Take 0.5-1  tablets by mouth 2 (two) times daily. 1 tab in the morning and 0.5 tab at bedtime   Yes Historical Provider, MD  Cholecalciferol (VITAMIN D3) 2000 UNITS TABS Take 1 tablet by mouth 2 (two) times daily.   Yes Historical Provider, MD  clonazePAM (KLONOPIN) 0.5 MG tablet Take 0.5 mg by mouth at bedtime.    Yes Historical Provider, MD  donepezil (ARICEPT) 5 MG tablet Take 5 mg by mouth daily.    Yes Historical Provider, MD  furosemide (LASIX) 40 MG tablet Take 40 mg by mouth daily.   Yes Historical Provider, MD  mirabegron ER (MYRBETRIQ) 50 MG TB24 Take by mouth daily.   Yes Historical Provider, MD  Multiple Vitamins-Minerals (OCUVITE PO) Take 1 tablet by mouth every evening.   Yes Historical Provider, MD  omega-3 acid ethyl esters (LOVAZA) 1 G capsule Take 2 g by mouth every evening.   Yes Historical Provider, MD  OMEPRAZOLE PO Take 1 capsule by mouth daily.   Yes Historical Provider, MD  Pramipexole Dihydrochloride (MIRAPEX ER) 0.375 MG TB24 Take 1 tablet by mouth every morning.   Yes Historical Provider, MD  traMADol (ULTRAM) 50 MG tablet Take 50 mg by mouth daily as needed. For pain   Yes Historical Provider, MD    Social History:    reports that she has never smoked.  She does not have any smokeless tobacco history on file. She reports that she does not drink alcohol or use illicit drugs.   Family History:    History reviewed. No pertinent family history.  Mental Status Examination/Evaluation: Objective:  Appearance: Frail, appears undernourished, Large ringed ecchmoses Rt eye  Psychomotor Activity:  Decreased mild tremor in hands  Eye Contact::  Good  Speech:  Clear and Coherent slow  Volume:  Decreased  Mood:  Anxious  Affect:  Blunt  Thought Process:  Coherent and Relevant  Orientation:  Full  Thought Content: focused on quality of nursing care  Suicidal Thoughts:  No  Homicidal Thoughts:  No  Judgement:  Fair  Insight:  Fair    DIAGNOSIS:   AXIS I  Altered Mental Status  due to medical conditions, Dementia Parkinson's Ds  AXIS II  Deffered  AXIS III See medical notes.  AXIS IV other psychosocial or environmental problems, problems related to social environment, problems with access to health care services and problems with primary support group  AXIS V 61-70 mild symptoms   Assessment/Plan: Discussed with Dr. Janee [name], Husband who has historical perspective and history Pt is sitting in chair conversing with husband.  She is smiling.  She has fixed affect.  She has impaired hearing, best in L ear.  She tries to provide hx which is accurate about her fall but gives hx of UTI which husband says occurred last year - twice and cause delirium.   He also says that she was not sleeping well and was quite irritated yesterday.  Today, she is calm pleasant and has demonstrated in conversation she is cognitively intact.  They have two sons.  No delusions are observed or described by husband.  She has had Parkinson's Disease for ~ 10 years.  She does well considering her condition.  PT and husband recommend a short stay in SNF to assist her in regaining strength.  Husband does not believe he would be able to manage her care alone at home.   RECOMMENDATION:  1.  Pt has capacity and complicated care 2.  Agree with transfer to SNF when medically stable 3. No further psychiatric needs unless requested.  MD Psychaitrist signs off.  [name] Gillingham J. Ferol Luz, MD Psychiatrist . 04/27/2012 5:24 PM

## 2012-04-27 NOTE — Clinical Social Work Note (Signed)
CSW talked with patient's husband who was at the bedside regarding discharge plans. Husband made aware that patient medically stable and MD inquired as to facility choice, Mr. Dinius advised that clinical information sent to [facility], however Mr. Pounds stated that he just wanted them to have her information, but his 1st choice is [facility] and he has talked to [facility] (admissions) and they have a bed for patient.  Genelle Bal, MSW, LCSW [phone]

## 2012-04-27 NOTE — Progress Notes (Signed)
Nutrition Follow-up  BSE completed on 6/24 recommending Regular diet with thin liquids. Psych consult requested for delusional behavior and medication management. Pt drinking Ensure Complete daily. Husband reports that pt is eating well, but pt would like to have salt packet added to trays. Pt consumed a Regular diet PTA.  Diet Order:  Heart Healthy Supplement: Ensure Complete daily  Meds: Scheduled Meds:   . amantadine  100 mg Oral Daily  . aspirin  325 mg Oral Daily  . beta carotene w/minerals  1 tablet Oral Daily  . carbidopa-levodopa  0.5 tablet Oral QHS  . carbidopa-levodopa  1 tablet Oral Daily  . clonazePAM  0.5 mg Oral QHS  . docusate sodium  100 mg Oral BID  . feeding supplement  237 mL Oral Q1500  . heparin  5,000 Units Subcutaneous Q8H  . insulin aspart  0-9 Units Subcutaneous TID WC  . metoprolol tartrate  25 mg Oral BID  . mirabegron ER  50 mg Oral Daily  . omega-3 acid ethyl esters  2 g Oral QHS  . pantoprazole  40 mg Oral Q0600  . polyethylene glycol  17 g Oral BID  . pramipexole  0.125 mg Oral TID  . sodium chloride  3 mL Intravenous Q12H  . white petrolatum       Continuous Infusions:   . sodium chloride 50 mL/hr at 04/26/12 1332  . DISCONTD: sodium chloride 50 mL/hr at 04/25/12 1409   PRN Meds:.acetaminophen, acetaminophen, alum & mag hydroxide-simeth, bisacodyl, haloperidol, haloperidol lactate, hydrALAZINE, ondansetron (ZOFRAN) IV, ondansetron, traMADol  Labs:  CMP     Component Value Date/Time   NA 142 04/27/2012 0520   K 4.0 04/27/2012 0520   CL 110 04/27/2012 0520   CO2 19 04/27/2012 0520   GLUCOSE 101* 04/27/2012 0520   BUN 29* 04/27/2012 0520   CREATININE 1.55* 04/27/2012 0520   CALCIUM 9.5 04/27/2012 0520   CALCIUM 12.1* 04/21/2012 2233   PROT 6.1 04/27/2012 0520   ALBUMIN 3.3* 04/27/2012 0520   AST 28 04/27/2012 0520   ALT 6 04/27/2012 0520   ALKPHOS 79 04/27/2012 0520   BILITOT 0.4 04/27/2012 0520   GFRNONAA 30* 04/27/2012 0520   GFRAA 35* 04/27/2012  0520     Intake/Output Summary (Last 24 hours) at 04/27/12 1033 Last data filed at 04/27/12 0900  Gross per 24 hour  Intake 1663.33 ml  Output    130 ml  Net 1533.33 ml    Weight Status:  50.1 kg - wt stable  Body mass index is 26.63 kg/(m^2). Overweight  Estimated needs:  1230 - 1375 kcal, 50 - 58 grams protein  Nutrition Dx:  Unintended wt loss r/t early satiety AEB pt unable to complete meals, constipation, and significant wt loss.  Goal:   1. Food/Beverage; improvement in intake to >50% of meals with resolve of AMS. Met. 2. Wt/wt change; deter further loss - continue 3. Labs; pt with acute-on-CKD, monitor trends and modify supplements/diet as needed - continue  Intervention:  Recommend liberalizing diet to help with PO intake. Continue Ensure Complete daily.  Monitor:   Weights, labs, PO intake, I/O's  Adair Laundry Pager #:  [phone]

## 2012-04-27 NOTE — Progress Notes (Signed)
[facility] PROGRESS NOTE    SUBJECTIVE   Patient sitting comfortable in room.  No complaints.  She knows she is in [facility] hospital and the year. Per notes, husband continues to note some hallucinations.   OBJECTIVE   Vital signs in last 24 hours: Temp:  [97.5 F (36.4 C)-98.5 F (36.9 C)] 97.8 F (36.6 C) (06/25 0900) Pulse Rate:  [50-93] 72  (06/25 0900) Resp:  [16-20] 16  (06/25 0900) BP: (149-184)/(63-92) 162/81 mmHg (06/25 0900) SpO2:  [93 %-97 %] 95 % (06/25 0900) Weight:  [50.1 kg (110 lb 7.2 oz)] 50.1 kg (110 lb 7.2 oz) (06/24 2035)  Intake/Output from previous day: 06/24 0701 - 06/25 0700 In: 1663.3 [P.O.:240; I.V.:1423.3] Out: 100 [Urine:100] Intake/Output this shift: Total I/O In: 100 [P.O.:100] Out: 130 [Urine:130] Nutritional status: Cardiac  Past Medical History  Diagnosis Date  . MI, old   . Parkinson disease   . Scoliosis   . Arthritis   . Bladder cancer   . HTN (hypertension) 04/21/2012  . Hyperlipidemia 04/21/2012  . DM (diabetes mellitus) 04/21/2012  . CAD (coronary artery disease) 04/21/2012  . Barrett esophagus 04/21/2012  . PUD (peptic ulcer disease) 04/21/2012  . Adrenal insufficiency 04/21/2012  . Raynaud's disease 04/21/2012  . Anxiety 04/21/2012  . Fibromyalgia 04/21/2012  . Hx of bladder cancer 04/21/2012  . Edema of lower extremity 04/21/2012    Chronic lower extremity edema  . Falls 04/21/2012    Neurologic Exam:  Mental Status: Alert,knows she is in hospital year 2013 and president is Obama.  She again states it is July. Speech fluent without evidence of aphasia. Able to follow 3 step commands without difficulty. Cranial Nerves: II-Visual fields grossly intact. III/IV/VI-Extraocular movements intact.  Pupils reactive bilaterally. V/VII-Smile symmetric VIII-grossly intact IX/X-normal gag XI-bilateral shoulder shrug XII-midline tongue extension Motor: 5/5 bilaterally with normal tone and  bulk, no cog wheeling noted, no rigidity.  Sensory: Pinprick and light touch intact throughout, bilaterally Deep Tendon Reflexes: 2+ and symmetric throughout Plantars downgoing bilaterally Cerebellar: Normal finger-to-nose, Upgoing toes bilaterally.     Lab Results: Lab Results  Component Value Date/Time   CHOL 156 04/22/2012  5:20 AM   Lipid Panel No results found for this basename: CHOL,TRIG,HDL,CHOLHDL,VLDL,LDLCALC in the last 72 hours  Studies/Results: No results found.  Medications:     Scheduled:   . amantadine  100 mg Oral Daily  . aspirin  325 mg Oral Daily  . beta carotene w/minerals  1 tablet Oral Daily  . carbidopa-levodopa  0.5 tablet Oral QHS  . carbidopa-levodopa  1 tablet Oral Daily  . clonazePAM  0.5 mg Oral QHS  . docusate sodium  100 mg Oral BID  . feeding supplement  237 mL Oral Q1500  . heparin  5,000 Units Subcutaneous Q8H  . insulin aspart  0-9 Units Subcutaneous TID WC  . metoprolol tartrate  25 mg Oral BID  . mirabegron ER  50 mg Oral Daily  . omega-3 acid ethyl esters  2 g Oral QHS  . pantoprazole  40 mg Oral Q0600  . polyethylene glycol  17 g Oral BID  . pramipexole  0.125 mg Oral TID  . sodium chloride  3 mL Intravenous Q12H  . white petrolatum        Assessment/Plan:  Patient Active Hospital Problem List: Encephalopathy acute (04/21/2012) Assessment: Continues to improve however yesterday per note patient did have some confusion and hallucinations. Psychology has been consulted.  Plan: 1. Agree with continued addressing of medical issues. Again, If symptoms of hallucinations/ confusion continue can consider to discontinue Amantadine (as guided by her primary neurologist) please see previous detailed note 04/23/12.     Parkinson's disease (04/21/2012) Assessment: Continues to be well controlled symptomatically at this point.  Plan: 1. Continue current medications.      Felicie Morn PA-C [facility] [phone]  04/27/2012, 12:08 PM

## 2012-04-27 NOTE — Progress Notes (Signed)
Physical Therapy Treatment Patient Details Name: [name] MRN: [id number] DOB: [birth year] Today's Date: 04/27/2012 Time: 2130-8657 PT Time Calculation (min): 39 min  PT Assessment / Plan / Recommendation Comments on Treatment Session  Good improvements today with much better participation and husband present;   continue to recommend SNF stay to maximize independence and safety with mobility prior to dc home;   Recommend pt ambulate in hallway with staf 3-4x/day    Follow Up Recommendations  Skilled nursing facility;Supervision/Assistance - 24 hour;Supervision for mobility/OOB    Barriers to Discharge        Equipment Recommendations  Defer to next venue    Recommendations for Other Services    Frequency Min 3X/week   Plan Discharge plan remains appropriate    Precautions / Restrictions Precautions Precautions: Fall   Pertinent Vitals/Pain no apparent distress Denies pain when asked    Mobility  Bed Mobility Bed Mobility: Supine to Sit;Sitting - Scoot to Edge of Bed Supine to Sit: 4: Min assist;With rails Sitting - Scoot to [place] of Bed: 3: Mod assist;With rail Details for Bed Mobility Assistance: Cues for technique, and to fully square off hips at EOB Transfers Transfers: Sit to Stand;Stand to Sit Sit to Stand: 3: Mod assist;From bed;With upper extremity assist Stand to Sit: 3: Mod assist;4: Min assist;To toilet;To chair/3-in-1;With armrests Details for Transfer Assistance: Cues for safety, handplacement; Required physical directioning of hips to toilet (was sitting impulsively as she needed to have a BM eminently); Continuing to require facilitation at hips for anterior weight shifting in prep for stand Ambulation/Gait Ambulation/Gait Assistance: 4: Min assist (second person for IV pole and safety) Ambulation Distance (Feet): 60 Feet Assistive device: Rolling walker Ambulation/Gait Assistance Details: Step length improving thourgh grossly short; tending to shuffle,  especially with turns, maneuvering RW in/out of bathroom Gait Pattern: Shuffle;Decreased step length - right;Decreased step length - left;Decreased hip/knee flexion - right;Decreased hip/knee flexion - left;Decreased dorsiflexion - right;Decreased dorsiflexion - left Gait velocity: slowed    Exercises     PT Diagnosis:    PT Problem List:   PT Treatment Interventions:     PT Goals Acute Rehab PT Goals Time For Goal Achievement: 04/29/12 Potential to Achieve Goals: Fair Pt will go Supine/Side to Sit: with modified independence PT Goal: Supine/Side to Sit - Progress: Progressing toward goal Pt will go Sit to Stand: with supervision PT Goal: Sit to Stand - Progress: Progressing toward goal Pt will go Stand to Sit: with supervision PT Goal: Stand to Sit - Progress: Progressing toward goal Pt will Ambulate: with least restrictive assistive device;>150 feet;with supervision PT Goal: Ambulate - Progress: Updated due to goal met (previous goal very close to being met)  Visit Information  Last PT Received On: 04/27/12 Assistance Needed: +2 (helpful)    Subjective Data  Subjective: Much more clear today; Wanting to get up and walk; Husband present Patient Stated Goal: Get better   Cognition  Overall Cognitive Status: Impaired Area of Impairment: Safety/judgement;Awareness of deficits Arousal/Alertness: Awake/alert Orientation Level: Appears intact for tasks assessed Behavior During Session: [facility] for tasks performed Safety/Judgement: Decreased awareness of safety precautions (though much improved) Cognition - Other Comments: Much improved mental status and ability to participate    Balance     End of Session PT - End of Session Equipment Utilized During Treatment: Gait belt Activity Tolerance: Patient tolerated treatment well Patient left: in chair;with chair alarm set;with family/visitor present;with call bell/phone within reach Nurse Communication: Mobility status    Marylu Lund,  [address] [place], [place] [phone]  04/27/2012, 12:25 PM

## 2012-04-27 NOTE — Progress Notes (Signed)
Interval history Patient had presented with acute encephalopathy with falls and noted to be hypercalcemic with a calcium of 13. And also noted to be in acute renal failure. It was felt patient's acute encephalopathy was secondary to her hypercalcemia in the certain of Parkinson's disease and dementia. Patient was admitted to a telemetry floor. Patient was placed on IV fluids her antihypertensives were held and the PTH phosphorus ionized calcium vitamin D levels were obtained which came back within normal limits. Patient was given some IV pamidronate. CT of the head was done which was negative MRI of the head was also done which was negative. Neurology was consulted secondary to patient's history of Parkinson's disease and recommendations were given in terms of her medications. Patient's selegiline was not resumed. He was maintained on her amantadine Sinemet and Mirapex. Patient improved clinically, her calcium levels improved and her renal function improved and was essentially close to baseline. Do too patient's husband's reports of delusions and hallucinations 80 psychiatric consultation was obtained and is pending. Was felt that patient's delusions and hallucinations were likely secondary to her acute medical issues in the setting of Parkinson's disease and dementia which might be progress. Patient is currently in stable condition will likely need nursing home placement/skilled nursing facility.   Subjective: Patient is more alert today. Patient is following commands. Is having a conversation appropriately. Is alert and oriented to self place and time.  Objective: Vital signs in last 24 hours: Filed Vitals:   04/26/12 1846 04/26/12 2035 04/27/12 0539 04/27/12 0900  BP: 172/63 149/66 184/92 162/81  Pulse: 93 63 68 72  Temp: 98.3 F (36.8 C) 98.5 F (36.9 C) 97.7 F (36.5 C) 97.8 F (36.6 C)  TempSrc: Oral Oral Oral Oral  Resp: 18 18 18 16   Height:      Weight:  50.1 kg (110 lb 7.2 oz)    SpO2:  93% 97% 97% 95%    Intake/Output Summary (Last 24 hours) at 04/27/12 1523 Last data filed at 04/27/12 1400  Gross per 24 hour  Intake 1563.33 ml  Output    255 ml  Net 1308.33 ml    Weight change: 0 kg (0 lb)  General: Alert, awake,  in no acute distress. HEENT: No bruits,. Right ecchymotic area around the right eye improving. Heart: Regular rate and rhythm, without murmurs, rubs, gallops. Lungs: Clear to auscultation bilaterally. Abdomen: Soft, nontender, nondistended, positive bowel sounds. Extremities: No clubbing cyanosis or edema with positive pedal pulses.    Lab Results:  [facility] 04/27/12 0520 04/26/12 1453  NA 142 141  K 4.0 4.2  CL 110 108  CO2 19 22  GLUCOSE 101* 110*  BUN 29* 29*  CREATININE 1.55* 1.65*  CALCIUM 9.5 9.7  MG -- --  PHOS -- --    Basename 04/27/12 0520 04/26/12 1453  AST 28 19  ALT 6 6  ALKPHOS 79 82  BILITOT 0.4 0.3  PROT 6.1 6.6  ALBUMIN 3.3* 3.5   No results found for this basename: LIPASE:2,AMYLASE:2 in the last 72 hours  Basename 04/25/12 0505  WBC 5.8  NEUTROABS --  HGB 10.5*  HCT 32.3*  MCV 87.3  PLT 262   No results found for this basename: CKTOTAL:3,CKMB:3,CKMBINDEX:3,TROPONINI:3 in the last 72 hours No components found with this basename: POCBNP:3 No results found for this basename: DDIMER:2 in the last 72 hours No results found for this basename: HGBA1C:2 in the last 72 hours No results found for this basename: CHOL:2,HDL:2,LDLCALC:2,TRIG:2,CHOLHDL:2,LDLDIRECT:2 in the last 72  hours No results found for this basename: TSH,T4TOTAL,FREET3,T3FREE,THYROIDAB in the last 72 hours No results found for this basename: VITAMINB12:2,FOLATE:2,FERRITIN:2,TIBC:2,IRON:2,RETICCTPCT:2 in the last 72 hours  Micro Results: No results found for this or any previous visit (from the past 240 hour(s)).  Studies/Results: No results found.  Medications:     . amantadine  100 mg Oral Daily  . aspirin  325 mg Oral Daily  . beta  carotene w/minerals  1 tablet Oral Daily  . carbidopa-levodopa  0.5 tablet Oral QHS  . carbidopa-levodopa  1 tablet Oral Daily  . clonazePAM  0.5 mg Oral QHS  . docusate sodium  100 mg Oral BID  . feeding supplement  237 mL Oral Q1500  . heparin  5,000 Units Subcutaneous Q8H  . insulin aspart  0-9 Units Subcutaneous TID WC  . metoprolol tartrate  25 mg Oral BID  . mirabegron ER  50 mg Oral Daily  . omega-3 acid ethyl esters  2 g Oral QHS  . pantoprazole  40 mg Oral Q0600  . polyethylene glycol  17 g Oral BID  . pramipexole  0.125 mg Oral TID  . sodium chloride  3 mL Intravenous Q12H  . white petrolatum        Assessment: Principal Problem:  *Encephalopathy acute Active Problems:  Hypercalcemia  Renal failure (ARF), acute on chronic  Dehydration  HTN (hypertension)  Hyperlipidemia  DM (diabetes mellitus)  Parkinson's disease  CAD (coronary artery disease)  Barrett esophagus  PUD (peptic ulcer disease)  Anxiety  Fibromyalgia  Falls  Hypokalemia  Constipation   Plan: #1 acute encephalopathy  Likely secondary to hypercalcemia with a calcium level of 13 on admission. Clinical improvement. Patient is on oral calcium supplements and vitamin D. Per husband patient had a colonoscopy and a mammogram done however not sure of the actual results but states it was not abnormal. Patient does have altered mental status, constipation, dehydration. CT of the head was negative. Chest x-ray which was done was negative. MRI was incomplete however no acute findings. Ionized calcium pending. intact PTH WNL. Phosphorus WNL. Magnesium WNL.  Vitamin D pending, TSH WNL. improving clinically. Corrected calcium level today is 10.1 Continue to hold patient's oral vitamin D supplements and oral calcium supplements. NSL IVF. Will hold patient's Lasix. S/P IV pamidronate. Follow. Neurology ffng and appreciate input and recommendations. Per husband patient more delusional with hallucinations today. Psychiatry  consult is pending.  Patient has no signs or symptoms of acute infection. Continue Haldol as needed. #2 hypercalcemia  Questionable etiology. May be secondary to exogenous calcium supplementation. Per husband patient has had a colonoscopy and a mammogram done not sure exactly how far back however states that were not abnormal. Chest x-ray which was done was negative. CT of the head which was done was negative. MRI of the head negative, TSH WNL, phosphorus  within normal limits,  magnesium level within normal limits,  ionized calcium pending,  intact PTH WNL.  Corrected calcium is 10.1 from 13. Slowly improving clinically but not yet at baseline.  Saline lock IV fluids. Holding patient's Lasix. Status post IV pamidronate.Follow. Will check a PTH RP. Follow. #3 acute on chronic renal failure  Patien t's last creatinine from 03/28/2011 was 1.54. I suspect patient does have a baseline chronic kidney disease. Patient's creatinine on admission was 3.50. Likely secondary to a prerenal azotemia secondary to dehydration, in the setting of diuretics. Renal function slowly improving and creatinine today at 1.55. Renal ultrasound negative for hydronephrosis. NSL IVF.  and follow. Monitor closely for volume overload as patient does have an EF of less than 50%. Continue to hold patient's Lasix.  #4 Parkinson's disease  Per family patient has had a worsening of her ataxia and increasing the tremors after her medications were changed by her neurologist. Will continue patient carbidopa and levodopa as well as her amantadine, and Mirapex. Aricept was discontinued per neurology. PT OT. Neurology following and appreciate input and recommendations.patient will need to followup with Dr. Sandria Manly as outpatient. #5 anxiety  Continue home dose clonazepam.  #6 hyperlipidemia  LDL is 74. #7 hypertension  Patient's systolic blood pressure in the ED was elevated. Patient does not seem to be on any antihypertensive medications from her  med rec list. Continue Lopressor  25mg  BID and titrate as tolerated.  #8 history of peptic ulcer disease  PPI.  #9 well controlled type 2 diabetes  Hemoglobin A1c = 6.4. Continue sliding scale insulin.  #10 history of coronary artery disease  Stable. Continue  beta blocker. Continue home dose aspirin. Will hold patient's Lasix secondary to problem #1 and will resume once patient is euvolemic. #11 hypokalemia Replete. # 12 falls Likely secondary to deteriorating Parkinson's disease. Continue current regimen. PT /OT.will likely need SNF.  #13 Constipation Continue miralax  BID and Colace 100mg  BID. #14 prophylaxis  PPI for GI prophylaxis, heparin for DVT prophylaxis.  #15 CODE STATUS  DO NOT RESUSCITATE.    LOS: 6 days   Layken Doenges [phone] 04/27/2012, 3:23 PM

## 2012-04-28 DIAGNOSIS — N179 Acute kidney failure, unspecified: Secondary | ICD-10-CM

## 2012-04-28 DIAGNOSIS — G20 Parkinson's disease: Secondary | ICD-10-CM

## 2012-04-28 DIAGNOSIS — F29 Unspecified psychosis not due to a substance or known physiological condition: Secondary | ICD-10-CM

## 2012-04-28 DIAGNOSIS — G9340 Encephalopathy, unspecified: Secondary | ICD-10-CM

## 2012-04-28 LAB — BASIC METABOLIC PANEL
BUN: 28 mg/dL — ABNORMAL HIGH (ref 6–23)
Chloride: 112 mEq/L (ref 96–112)
GFR calc Af Amer: 35 mL/min — ABNORMAL LOW (ref >90)
GFR calc non Af Amer: 30 mL/min — ABNORMAL LOW (ref >90)
Potassium: 4 mEq/L (ref 3.5–5.1)
Sodium: 143 mEq/L (ref 135–145)

## 2012-04-28 LAB — GLUCOSE, CAPILLARY: Glucose-Capillary: 65 mg/dL — ABNORMAL LOW (ref 70–99)

## 2012-04-28 MED ORDER — ENSURE COMPLETE PO LIQD: 237.0000 mL | Freq: Every day | ORAL | Status: DC

## 2012-04-28 MED ORDER — CLONAZEPAM 0.5 MG PO TABS: 0.5000 mg | ORAL_TABLET | Freq: Every day | ORAL | Status: DC

## 2012-04-28 MED ORDER — METOPROLOL TARTRATE 25 MG PO TABS: 25.0000 mg | ORAL_TABLET | Freq: Two times a day (BID) | ORAL | Status: DC

## 2012-04-28 NOTE — Consult Note (Signed)
Follow up with patient and husband with regards to psych consult. Plan at this time remains for patient to go to SNF [facility] for continue medical care and physical therapy rehab. All are agreeable at this time. No psych interventions needed and Psych CSW has signed off.  If needs arise please contact.  Ashley Jacobs, MSW LCSW [phone]

## 2012-04-28 NOTE — Progress Notes (Signed)
Subjective: Patient seen at the bedside.; Sitting comfortably in the chair.  She is awake,alert and follows commands. No complications overnight.   She was diagnosed with PD plus syndrome approximately 10 years ago Currently on Sinemet.. Not on SSRI. Mental confusion has significantly resolved.  Objective: Current vital signs: BP 176/79  Pulse 59  Temp 98 F (36.7 C) (Oral)  Resp 18  Ht 4\' 6"  (1.372 m)  Wt 52.6 kg (115 lb 15.4 oz)  BMI 27.96 kg/m2  SpO2 97% Vital signs in last 24 hours: Temp:  [97.6 F (36.4 C)-98.3 F (36.8 C)] 98 F (36.7 C) (06/26 1000) Pulse Rate:  [59-70] 59  (06/26 1000) Resp:  [17-18] 18  (06/26 1000) BP: (154-190)/(62-79) 176/79 mmHg (06/26 1000) SpO2:  [95 %-99 %] 97 % (06/26 1000) Weight:  [52.6 kg (115 lb 15.4 oz)] 52.6 kg (115 lb 15.4 oz) (06/25 2125)  Intake/Output from previous day: 06/25 0701 - 06/26 0700 In: 600 [P.O.:200; I.V.:400] Out: 380 [Urine:380] Intake/Output this shift: Total I/O In: 240 [P.O.:240] Out: -  Nutritional status: Cardiac    General: She is awake, alert and follows commands.   Lungs: CTA Heart: RRR Abdomen: Bowel sounds present Back: Significant Scoliosis Extremities: No edema Neurologic Exam: Mental Status: Alert, oriented, thought content appropriate.  Speech fluent without evidence of aphasia.  Able to follow 3 step commands without difficulty. Cranial Nerves: II: visual fields grossly normal, pupils equal, round, reactive to light and accommodation III,IV, VI: ptosis not present, extra-ocular motions intact bilaterally V,VII: smile symmetric, facial light touch sensation normal bilaterally VIII: hearing normal bilaterally IX,X: gag reflex present XI: trapezius strength/neck flexion strength normal bilaterally XII: tongue strength normal  Motor: Right :Able to move all the extremities antigravity. However, unable to get up from chair with arms folded- significant proximal muscle wasting.  No cogwheel  rigidity atrophy noted Sensory: Pinprick and light touch intact throughout, bilaterally Deep Tendon Reflexes: 2+ and symmetric throughout Plantars: Right: downgoing   Left: downgoing Gait: Significantly slow   Lab Results: Results for orders placed during the hospital encounter of 04/21/12 (from the past 48 hour(s))  GLUCOSE, CAPILLARY     Status: Normal   Collection Time   04/26/12  1:08 PM      Component Value Range Comment   Glucose-Capillary 88  70 - 99 mg/dL   COMPREHENSIVE METABOLIC PANEL     Status: Abnormal   Collection Time   04/26/12  2:53 PM      Component Value Range Comment   Sodium 141  135 - 145 mEq/L    Potassium 4.2  3.5 - 5.1 mEq/L    Chloride 108  96 - 112 mEq/L    CO2 22  19 - 32 mEq/L    Glucose, Bld 110 (*) 70 - 99 mg/dL    BUN 29 (*) 6 - 23 mg/dL    Creatinine, Ser 1.61 (*) 0.50 - 1.10 mg/dL    Calcium 9.7  8.4 - 09.6 mg/dL    Total Protein 6.6  6.0 - 8.3 g/dL    Albumin 3.5  3.5 - 5.2 g/dL    AST 19  0 - 37 U/L    ALT 6  0 - 35 U/L    Alkaline Phosphatase 82  39 - 117 U/L    Total Bilirubin 0.3  0.3 - 1.2 mg/dL    GFR calc non Af Amer 28 (*) >90 mL/min    GFR calc Af Amer 32 (*) >90 mL/min   GLUCOSE,  CAPILLARY     Status: Abnormal   Collection Time   04/26/12  4:33 PM      Component Value Range Comment   Glucose-Capillary 114 (*) 70 - 99 mg/dL   GLUCOSE, CAPILLARY     Status: Abnormal   Collection Time   04/26/12  9:31 PM      Component Value Range Comment   Glucose-Capillary 126 (*) 70 - 99 mg/dL    Comment 1 Notify RN      Comment 2 Documented in Chart     COMPREHENSIVE METABOLIC PANEL     Status: Abnormal   Collection Time   04/27/12  5:20 AM      Component Value Range Comment   Sodium 142  135 - 145 mEq/L    Potassium 4.0  3.5 - 5.1 mEq/L    Chloride 110  96 - 112 mEq/L    CO2 19  19 - 32 mEq/L    Glucose, Bld 101 (*) 70 - 99 mg/dL    BUN 29 (*) 6 - 23 mg/dL    Creatinine, Ser 4.09 (*) 0.50 - 1.10 mg/dL    Calcium 9.5  8.4 - 81.1 mg/dL     Total Protein 6.1  6.0 - 8.3 g/dL    Albumin 3.3 (*) 3.5 - 5.2 g/dL    AST 28  0 - 37 U/L    ALT 6  0 - 35 U/L    Alkaline Phosphatase 79  39 - 117 U/L    Total Bilirubin 0.4  0.3 - 1.2 mg/dL    GFR calc non Af Amer 30 (*) >90 mL/min    GFR calc Af Amer 35 (*) >90 mL/min   GLUCOSE, CAPILLARY     Status: Normal   Collection Time   04/27/12  8:04 AM      Component Value Range Comment   Glucose-Capillary 87  70 - 99 mg/dL    Comment 1 Notify RN     GLUCOSE, CAPILLARY     Status: Normal   Collection Time   04/27/12 12:02 PM      Component Value Range Comment   Glucose-Capillary 80  70 - 99 mg/dL   GLUCOSE, CAPILLARY     Status: Abnormal   Collection Time   04/27/12  4:52 PM      Component Value Range Comment   Glucose-Capillary 100 (*) 70 - 99 mg/dL   GLUCOSE, CAPILLARY     Status: Normal   Collection Time   04/27/12  9:09 PM      Component Value Range Comment   Glucose-Capillary 91  70 - 99 mg/dL   SODIUM, URINE, RANDOM     Status: Normal   Collection Time   04/27/12 10:24 PM      Component Value Range Comment   Sodium, Ur 97     CREATININE, URINE, RANDOM     Status: Normal   Collection Time   04/27/12 10:24 PM      Component Value Range Comment   Creatinine, Urine 27.56     BASIC METABOLIC PANEL     Status: Abnormal   Collection Time   04/28/12  5:50 AM      Component Value Range Comment   Sodium 143  135 - 145 mEq/L    Potassium 4.0  3.5 - 5.1 mEq/L    Chloride 112  96 - 112 mEq/L    CO2 21  19 - 32 mEq/L    Glucose, Bld 90  70 -  99 mg/dL    BUN 28 (*) 6 - 23 mg/dL    Creatinine, Ser 1.61 (*) 0.50 - 1.10 mg/dL    Calcium 9.4  8.4 - 09.6 mg/dL    GFR calc non Af Amer 30 (*) >90 mL/min    GFR calc Af Amer 35 (*) >90 mL/min   GLUCOSE, CAPILLARY     Status: Abnormal   Collection Time   04/28/12  7:37 AM      Component Value Range Comment   Glucose-Capillary 65 (*) 70 - 99 mg/dL    Comment 1 Documented in Chart      Comment 2 Notify RN     GLUCOSE, CAPILLARY      Status: Abnormal   Collection Time   04/28/12  8:33 AM      Component Value Range Comment   Glucose-Capillary 133 (*) 70 - 99 mg/dL    Comment 1 Documented in Chart      Comment 2 Notify RN     GLUCOSE, CAPILLARY     Status: Abnormal   Collection Time   04/28/12 11:26 AM      Component Value Range Comment   Glucose-Capillary 106 (*) 70 - 99 mg/dL    Comment 1 Documented in Chart      Comment 2 Notify RN       No results found for this or any previous visit (from the past 240 hour(s)).  Lipid Panel No results found for this basename: CHOL,TRIG,HDL,CHOLHDL,VLDL,LDLCALC in the last 72 hours  Studies/Results: No results found.  Medications:  Scheduled:   . amantadine  100 mg Oral Daily  . aspirin  325 mg Oral Daily  . beta carotene w/minerals  1 tablet Oral Daily  . carbidopa-levodopa  0.5 tablet Oral QHS  . carbidopa-levodopa  1 tablet Oral Daily  . clonazePAM  0.5 mg Oral QHS  . docusate sodium  100 mg Oral BID  . feeding supplement  237 mL Oral Q1500  . heparin  5,000 Units Subcutaneous Q8H  . insulin aspart  0-9 Units Subcutaneous TID WC  . metoprolol tartrate  25 mg Oral BID  . mirabegron ER  50 mg Oral Daily  . omega-3 acid ethyl esters  2 g Oral QHS  . pantoprazole  40 mg Oral Q0600  . polyethylene glycol  17 g Oral BID  . pramipexole  0.125 mg Oral TID  . sodium chloride  3 mL Intravenous Q12H    Assessment/Plan: Patient is doing well. No change in PD meds.  Patient is going to Rehab. Will sign off         Khali Albanese V-P Eilleen Kempf., MD., Ph.D.,MS 04/28/2012, 12:54 PM       LOS: 7 days    04/28/2012  12:46 PM

## 2012-04-28 NOTE — Discharge Summary (Signed)
Physician Discharge Summary  [name] ZOX:[id number] DOB: 06/13/1928 DOA: 04/21/2012  PCP: Allean Found, MD Neurologist: Avie Echevaria, M.D.  Admit date: 04/21/2012 Discharge date: 04/28/2012  Recommendations for Outpatient Follow-up:  1. Followup continue treatment for Parkinson's and complications. 2. Followup progress with physical therapy and occupational therapy for complications of Parkinson's. 3. Consider repeat basic metabolic panel in approximately one week for followup kidney function and calcium levels. 4. Monitor for fluid accumulation as Lasix is currently on hold. May need to be instituted Lasix depending on fluid balance.  Follow-up Information    Follow up with Allean Found, MD in 2 weeks.   Contact information:   [address] [place] [address] [phone]       Follow up with Evie Lacks, MD in 3 weeks.   Contact information:   [address] [place] [address] [phone]         Discharge Diagnoses:  1. Acute encephalopathy 2. Hypercalcemia 3. Acute renal failure 4. Normocytic anemia 5. Parkinson's disease 6. Severe malnutrition in context of chronic illness  Discharge Condition: Improved Disposition: Skilled nursing facility  Diet recommendation: Regular diet, thin liquids  History of present illness:  78 year old woman presented to the emergency department with altered mental status superimposed on baseline dementia secondary to Parkinson's. Worsening depression, memory, hallucinations, ataxia per family. Recently started on Effexor and donepezil 6 days prior to admission. Was difficult to arouse and husband called 911.  In the emergency department was found to have acute encephalopathy, hypercalcemia, acute renal failure and admitted.    Hospital Course:  [name] was seen in consultation with neurology who made recommendations as detailed below. Hypercalcemia was treated with IV  pamidronate and IV fluids and cessation of oral calcium supplementation it has resolved. Acute renal failure resolved with IV fluids and supportive care. Acute encephalopathy appears resolved at this point the patient has had delusions and hallucinations that are secondary to her Parkinson's disease. She was seen also by psychiatry who recommended no further psychiatric evaluation. She is stable for discharge at this point. Acute issues outlined below.  1. Acute encephalopathy: Resolved. Secondary to hypercalcemia and acute renal failure. Subcute. Present 3 weeks prior to admission. CT of head was negative as was chest x-ray and limited MRI.    2. Hypercalcemia: Resolved. Secondary to exogenous supplementation. Treated with IV fluids and pamidronate.  3. Acute renal failure: Resolved with IV fluids and holding of Lasix. Appears to be at baseline. Likely has chronic kidney disease stage III. Followup as an outpatient. Thought to be secondary to prerenal azotemia secondary to dehydration in the setting of diuretics. Renal ultrasound unremarkable.  4. History closed head injury 04/20/2012.  5. Normocytic anemia: Stable. Followup as clinically indicated.  6. Parkinson's disease: Stable. Neurology recommendations outlined below. "History of worsening ataxia and increased tremors after changing her medications. Patient was diagnosed approximately 11 years ago. Since that time, she has been tried on various medications including Mirapex, Sinemet, and Selegiline and was previously well maintained on this regimen. She has experienced gradual decline of her motor skills over the last 1 year - specifically with increasing rigidity, difficulty with ambulation, and difficulty with feeding herself as a result. This has progressively worsened over last 3-4 weeks. At baseline, the patient requires help with some ADLs including cooking/ cleaning/ help with dressing, but is able to bathe independently. She uses a walker at  baseline. Last week, the patient was seen by her primary  neurologist , at which time she and family complained of worsening depression symptoms (feeling low all of the time), increase in hallucinations. In response to these complaints, the patient was discontinued of her Selegiline and started on Effexor and Aricept. The family feels that her symptoms have acutely worsened since starting the Effexor and Aricept / stopping the Selegiline. She has experienced increased falls since last week, approximately 4-5 episodes and has sustained various injuries as a result."    7. Diabetes mellitus: Episode of hypoglycemia noted, asymptomatic.  8. Anxiety: Stable. Continue clonazepam.  9. Hypertension: Mild elevation. Followup as an outpatient  10. Severe malnutrition in context of chronic illness: Ensure once daily. Recommend assistance with meals.  Consultants:  Neurology: Continue Mirapex, Sinemet, amantadine. Discontinue Aricept. Do not resume Selegine. If symptoms of hallucinations/ confusion continue after acute issues resolve, can consider to discontinue Amantadine (likely as guided by her primary neurologist) given that in the elderly, use of NMDA antagonist + DA agonist can potentiate side effects. However, again, these are long term medications and likely are not acute cause of her presenting symptoms.   Psychiatry: Altered mental status secondary to medical conditions. No delusions observed. Patient has capacity. No further psychiatric needs identified   Occupational therapy: Skilled nursing facility recommended.   Physical therapy: Skilled nursing facility.   Speech therapy: Regular diet, thin liquids. Whole medications with liquid.  Procedures:  None  Discharge Instructions  Discharge Orders    Future Orders Please Complete By Expires   Diet general      Increase activity slowly        Medication List  As of 04/28/2012  1:58 PM   STOP taking these medications         CALCIUM-VITAMIN  D PO      donepezil 5 MG tablet      furosemide 40 MG tablet      Vitamin D3 2000 UNITS Tabs         TAKE these medications         amantadine 100 MG capsule   Commonly known as: SYMMETREL   Take 100 mg by mouth daily.      aspirin 325 MG tablet   Take 325 mg by mouth daily.      carbidopa-levodopa 25-100 MG per tablet   Commonly known as: SINEMET IR   Take 0.5-1 tablets by mouth 2 (two) times daily. 1 tab in the morning and 0.5 tab at bedtime      clonazePAM 0.5 MG tablet   Commonly known as: KLONOPIN   Take 1 tablet (0.5 mg total) by mouth at bedtime.      feeding supplement Liqd   Take 237 mLs by mouth daily at 3 pm.      metoprolol tartrate 25 MG tablet   Commonly known as: LOPRESSOR   Take 1 tablet (25 mg total) by mouth 2 (two) times daily.      MIRAPEX ER 0.375 MG Tb24   Generic drug: Pramipexole Dihydrochloride   Take 1 tablet by mouth every morning.      MYRBETRIQ 50 MG Tb24   Generic drug: mirabegron ER   Take by mouth daily.      OCUVITE PO   Take 1 tablet by mouth every evening.      omega-3 acid ethyl esters 1 G capsule   Commonly known as: LOVAZA   Take 2 g by mouth every evening.      OMEPRAZOLE PO   Take 1 capsule by  mouth daily.      traMADol 50 MG tablet   Commonly known as: ULTRAM   Take 50 mg by mouth daily as needed. For pain           The results of significant diagnostics from this hospitalization (including imaging, microbiology, ancillary and laboratory) are listed below for reference.    Significant Diagnostic Studies: Dg Chest 2 View  04/21/2012  *RADIOLOGY REPORT*  Clinical Data: Fall, confusion, question infiltrate, history Parkinson's, bladder cancer  CHEST - 2 VIEW  Comparison: 09/30/2011  Findings: Enlargement of cardiac silhouette. Calcified tortuous aorta. Small hiatal hernia. Pulmonary vascularity normal. Thoracolumbar scoliosis and mild kyphosis. Minimal linear scarring versus subsegmental atelectasis at lingula.  Lungs appear emphysematous but otherwise clear. No pleural effusion or pneumothorax. Osseous demineralization.  IMPRESSION: Enlargement of cardiac silhouette. Hiatal hernia. Minimal linear scarring versus subsegmental atelectasis at lingula.  Original Report Authenticated By: Lollie Marrow, M.D.   Ct Head Wo Contrast  04/21/2012  *RADIOLOGY REPORT*  Clinical Data: Altered mental status.  CT HEAD WITHOUT CONTRAST  Technique:  Contiguous axial images were obtained from the base of the skull through the vertex without contrast.  Comparison: CT head 04/20/2012.  Findings: A right periorbital scalp hematoma is slightly decreased relative to the prior study.  The right maxillary sinus is opacified.  This does not appear related to the trauma.  The right globe and orbit is otherwise intact.  Moderate generalized atrophy is stable.  Mild diffuse white matter hypoattenuation is evident.  The ventricles are proportionate to the degree of atrophy.  There is no significant extra-axial fluid collection.  IMPRESSION:  1.  Slight decrease in size of a right periorbital hematoma. 2.  No acute intracranial abnormality or significant interval change. 2.  Stable atrophy and white matter disease.  Original Report Authenticated By: Jamesetta Orleans. MATTERN, M.D.   US Renal  04/22/2012  *RADIOLOGY REPORT*  Clinical Data: Hypertension, acute renal failure.  RENAL/URINARY TRACT ULTRASOUND COMPLETE  Comparison:  05/28/2011 CT  Findings:  Right Kidney:  Measures 11.6 cm.  Bifid renal pelvis and/or partial duplication.  No hydronephrosis.  No focal abnormality.  Left Kidney:  Measures 9.3 cm.  Poor acoustic windows limits visualization. No hydronephrosis.  Question heterogeneous/increased echogenicity.This can be seen with medical renal disease, however typically that would be bilateral.  Bladder:  Partially decompressed, with a Foley catheter balloon in place.  Prevoid volume of 78 ml.  IMPRESSION: No hydronephrosis.  Original Report  Authenticated By: Waneta Martins, M.D.   Mr Brain Ltd W/o Cm  04/21/2012  *RADIOLOGY REPORT*  Clinical Data: Altered mental status  MRI HEAD WITHOUT CONTRAST  Technique:  Multiplanar, multiecho pulse sequences of the brain and surrounding structures were obtained according to standard protocol without intravenous contrast.  Comparison: CT 04/21/2012  Findings: Incomplete study.  The patient was moving throughout the study  and was not able to complete the study.  Axial diffusion and FLAIR and sagittal T1 imaging was performed.  Negative for acute infarct.  There is generalized atrophy.  No significant chronic ischemic change.  Right maxillary sinus is filled with secretions.  There is a right frontal scalp hematoma, unchanged from the  prior study.  IMPRESSION: Incomplete study.  The patient was not able to cooperate for the examination.  No acute infarct is identified.  Original Report Authenticated By: Camelia Phenes, M.D.   Labs: Basic Metabolic Panel:  Lab 04/28/12 7253 04/27/12 6644 04/26/12 1453 04/25/12 0505 04/24/12 0347 04/21/12 2233  NA 143 142 141 142 139 --  K 4.0 4.0 4.2 4.0 4.1 --  CL 112 110 108 112 107 --  CO2 21 19 22 21 21  --  GLUCOSE 90 101* 110* 65* 109* --  BUN 28* 29* 29* 33* 40* --  CREATININE 1.54* 1.55* 1.65* 1.79* 1.95* --  CALCIUM 9.4 9.5 9.7 9.5 10.0 --  MG -- -- -- -- -- 2.2  PHOS -- -- -- -- -- 4.0   Liver Function Tests:  Lab 04/27/12 0520 04/26/12 1453 04/25/12 0505 04/24/12 0516 04/23/12 0525 04/22/12 0520  AST 28 19 18 16  -- 25  ALT 6 6 <5 5 -- 11  ALKPHOS 79 82 67 65 -- 82  BILITOT 0.4 0.3 0.3 0.4 -- 0.5  PROT 6.1 6.6 5.5* 5.4* -- 6.4  ALBUMIN 3.3* 3.5 2.8* 2.8* 3.2* --   CBC:  Lab 04/25/12 0505 04/23/12 0525 04/22/12 0520 04/21/12 2233  WBC 5.8 6.8 8.7 6.3  NEUTROABS -- -- -- --  HGB 10.5* 11.2* 11.4* 11.9*  HCT 32.3* 33.6* 34.4* 36.2  MCV 87.3 85.7 85.8 84.4  PLT 262 274 293 276   CBG:  Lab 04/28/12 1126 04/28/12 0833 04/28/12 0737  04/27/12 2109 04/27/12 1652  GLUCAP 106* 133* 65* 91 100*    Principal Problem:  *Encephalopathy acute Active Problems:  Hypercalcemia  Renal failure (ARF), acute on chronic  Dehydration  DM (diabetes mellitus)  Anxiety  Falls  HTN (hypertension)  Parkinson's disease   Time coordinating discharge: 45 minutes.  Signed:  [name], MD [facility] 04/28/2012, 1:58 PM

## 2012-04-28 NOTE — Progress Notes (Signed)
CBG: 65  Treatment: 15 GM carbohydrate snack  Symptoms: None  Follow-up CBG: [id number] CBG Result:133  Possible Reasons for Event: Unknown  Comments/MD notified:Dr. Irene Limbo notified    [name]

## 2012-04-28 NOTE — Progress Notes (Signed)
[facility] PROGRESS NOTE  [name] WUJ:[id number] DOB: Dec 15, 1927 DOA: 04/21/2012 PCP: Allean Found, MD Neurologist: Avie Echevaria, M.D.  Assessment/Plan: 1. Acute encephalopathy: Resolved. Secondary to hypercalcemia and acute renal failure. Subcute. Present 3 weeks prior to admission. CT of head was negative as was chest x-ray and limited MRI.  2. Hypercalcemia: Resolved. Secondary to exogenous supplementation. Treated with IV fluids and pamidronate. 3. Acute renal failure: Resolved with IV fluids and holding of Lasix. Appears to be at baseline. Likely has chronic kidney disease stage III. Followup as an outpatient. Thought to be secondary to prerenal azotemia secondary to dehydration in the setting of diuretics. Renal ultrasound unremarkable. 4. History closed head injury 04/20/2012. 5. Normocytic anemia: Stable. Followup as clinically indicated. 6. Parkinson's disease: Stable. Neurology recommendations outlined below. History of worsening ataxia and increased tremors after changing her medications. Patient was diagnosed approximately 11 years ago. Since that time, she has been tried on various medications including Mirapex, Sinemet, and Selegiline and was previously well maintained on this regimen. She has experienced gradual decline of her motor skills over the last 1 year - specifically with increasing rigidity, difficulty with ambulation, and difficulty with feeding herself as a result. This has progressively worsened over last 3-4 weeks. At baseline, the patient requires help with some ADLs including cooking/ cleaning/ help with dressing, but is able to bathe independently. She uses a walker at baseline. Last week, the patient was seen by her primary neurologist , at which time she and family complained of worsening depression symptoms (feeling low all of the time), increase in hallucinations. In response to these complaints, the patient was discontinued of her Selegiline and  started on Effexor and Aricept. The family feels that her symptoms have acutely worsened since starting the Effexor and Aricept / stopping the Selegiline. She has experienced increased falls since last week, approximately 4-5 episodes and has sustained various injuries as a result.  7. Diabetes mellitus: Episode of hypoglycemia noted, asymptomatic. 8. Anxiety: Stable. Continue clonazepam. 9. Hypertension: Mild elevation. Followup as an outpatient 10. Severe malnutrition in context of chronic illness: Ensure once daily. Recommend assistance with meals.  Code Status: DO NOT RESUSCITATE Family Communication: Discussed with husband at bedside Disposition Plan: Skilled nursing facility today  [name], MD  [facility] Pager [phone]. If 8PM-8AM, please contact night-coverage at [email or web address], password [facility] 04/28/2012, 12:51 PM  LOS: 7 days   Brief narrative: 78 year old woman presented to the emergency department with altered mental status superimposed on baseline dementia secondary to Parkinson's. Worsening depression, memory, hallucinations, ataxia per family. Recently started on Effexor and donepezil 6 days prior to admission. Was difficult to arouse and husband called 911.  In the emergency department was found to have acute encephalopathy, hypercalcemia, acute renal failure and admitted.  Patient was seen in consultation with neurology who made recommendations as detailed below. Patient's encephalopathy was noted to clear slowly. Hypercalcemia was treated with IV pamidronate and IV fluids and cessation of oral calcium supplementation.  Mental status continued to improve although she did have reports of being agitated at times with hallucinations.  Chart review:  04/20/2012 Emergency department visit: Fall, closed head injury. Mental status decline over the last few weeks secondary to Parkinson's was noted at that time. Diagnosed with traumatic hematoma of the head.  Discharged home from the emergency department.  Consultants:  Neurology: Continue Mirapex, Sinemet, amantadine. Discontinue Aricept. Do not resume Selegine. If symptoms of hallucinations/ confusion continue after acute issues resolve, can consider to discontinue  Amantadine (likely as guided by her primary neurologist) given that in the elderly, use of NMDA antagonist + DA agonist can potentiate side effects. However, again, these are long term medications and likely are not acute cause of her presenting symptoms.  Psychiatry: Altered mental status secondary to medical conditions. No delusions observed. Patient has capacity. No further psychiatric needs identified  Occupational therapy: Skilled nursing facility recommended.  Physical therapy: Skilled nursing facility.  Speech therapy: Regular diet, thin liquids. Whole medications with liquid.  Procedures:  None  HPI/Subjective:  afebrile, vital signs stable.one episode of hypoglycemia. No complaints  Objective: Filed Vitals:   04/27/12 2114 04/27/12 2125 04/28/12 0600 04/28/12 1000  BP: 176/73 176/73 154/62 176/79  Pulse: 70 70 62 59  Temp:  98.3 F (36.8 C) 98 F (36.7 C) 98 F (36.7 C)  TempSrc:  Oral Oral Oral  Resp:  17 17 18   Height:      Weight:  52.6 kg (115 lb 15.4 oz)    SpO2:  99% 95% 97%    Intake/Output Summary (Last 24 hours) at 04/28/12 1251 Last data filed at 04/28/12 0900  Gross per 24 hour  Intake    740 ml  Output    250 ml  Net    490 ml    Exam:   General:  Appears calm and comfortable. Sitting eating lunch.  Cardiovascular: Regular rate and rhythm. 2/6 systolic murmur. No rub or gallop.  Respiratory: Clear to auscultation bilaterally. No wheezes, rales, rhonchi. Normal respiratory effort.  Data Reviewed: Basic Metabolic Panel:  Lab 04/28/12 1610 04/27/12 0520 04/26/12 1453 04/25/12 0505 04/24/12 0516 04/21/12 2233  NA 143 142 141 142 139 --  K 4.0 4.0 4.2 4.0 4.1 --  CL 112 110 108 112  107 --  CO2 21 19 22 21 21  --  GLUCOSE 90 101* 110* 65* 109* --  BUN 28* 29* 29* 33* 40* --  CREATININE 1.54* 1.55* 1.65* 1.79* 1.95* --  CALCIUM 9.4 9.5 9.7 9.5 10.0 --  MG -- -- -- -- -- 2.2  PHOS -- -- -- -- -- 4.0   Liver Function Tests:  Lab 04/27/12 0520 04/26/12 1453 04/25/12 0505 04/24/12 0516 04/23/12 0525 04/22/12 0520  AST 28 19 18 16  -- 25  ALT 6 6 <5 5 -- 11  ALKPHOS 79 82 67 65 -- 82  BILITOT 0.4 0.3 0.3 0.4 -- 0.5  PROT 6.1 6.6 5.5* 5.4* -- 6.4  ALBUMIN 3.3* 3.5 2.8* 2.8* 3.2* --   CBC:  Lab 04/25/12 0505 04/23/12 0525 04/22/12 0520 04/21/12 2233  WBC 5.8 6.8 8.7 6.3  NEUTROABS -- -- -- --  HGB 10.5* 11.2* 11.4* 11.9*  HCT 32.3* 33.6* 34.4* 36.2  MCV 87.3 85.7 85.8 84.4  PLT 262 274 293 276   CBG:  Lab 04/28/12 1126 04/28/12 0833 04/28/12 0737 04/27/12 2109 04/27/12 1652  GLUCAP 106* 133* 65* 91 100*   Studies: Dg Chest 2 View  04/21/2012  *RADIOLOGY REPORT*  Clinical Data: Fall, confusion, question infiltrate, history Parkinson's, bladder cancer  CHEST - 2 VIEW  Comparison: 09/30/2011  Findings: Enlargement of cardiac silhouette. Calcified tortuous aorta. Small hiatal hernia. Pulmonary vascularity normal. Thoracolumbar scoliosis and mild kyphosis. Minimal linear scarring versus subsegmental atelectasis at lingula. Lungs appear emphysematous but otherwise clear. No pleural effusion or pneumothorax. Osseous demineralization.  IMPRESSION: Enlargement of cardiac silhouette. Hiatal hernia. Minimal linear scarring versus subsegmental atelectasis at lingula.  Original Report Authenticated By: Lollie Marrow, M.D.   Ct  Head Wo Contrast  04/21/2012  *RADIOLOGY REPORT*  Clinical Data: Altered mental status.  CT HEAD WITHOUT CONTRAST  Technique:  Contiguous axial images were obtained from the base of the skull through the vertex without contrast.  Comparison: CT head 04/20/2012.  Findings: A right periorbital scalp hematoma is slightly decreased relative to the prior study.   The right maxillary sinus is opacified.  This does not appear related to the trauma.  The right globe and orbit is otherwise intact.  Moderate generalized atrophy is stable.  Mild diffuse white matter hypoattenuation is evident.  The ventricles are proportionate to the degree of atrophy.  There is no significant extra-axial fluid collection.  IMPRESSION:  1.  Slight decrease in size of a right periorbital hematoma. 2.  No acute intracranial abnormality or significant interval change. 2.  Stable atrophy and white matter disease.  Original Report Authenticated By: Jamesetta Orleans. MATTERN, M.D.   US Renal  04/22/2012  *RADIOLOGY REPORT*  Clinical Data: Hypertension, acute renal failure.  RENAL/URINARY TRACT ULTRASOUND COMPLETE  Comparison:  05/28/2011 CT  Findings:  Right Kidney:  Measures 11.6 cm.  Bifid renal pelvis and/or partial duplication.  No hydronephrosis.  No focal abnormality.  Left Kidney:  Measures 9.3 cm.  Poor acoustic windows limits visualization. No hydronephrosis.  Question heterogeneous/increased echogenicity.This can be seen with medical renal disease, however typically that would be bilateral.  Bladder:  Partially decompressed, with a Foley catheter balloon in place.  Prevoid volume of 78 ml.  IMPRESSION: No hydronephrosis.  Original Report Authenticated By: Waneta Martins, M.D.   Mr Brain Ltd W/o Cm  04/21/2012  *RADIOLOGY REPORT*  Clinical Data: Altered mental status  MRI HEAD WITHOUT CONTRAST  Technique:  Multiplanar, multiecho pulse sequences of the brain and surrounding structures were obtained according to standard protocol without intravenous contrast.  Comparison: CT 04/21/2012  Findings: Incomplete study.  The patient was moving throughout the study  and was not able to complete the study.  Axial diffusion and FLAIR and sagittal T1 imaging was performed.  Negative for acute infarct.  There is generalized atrophy.  No significant chronic ischemic change.  Right maxillary sinus is  filled with secretions.  There is a right frontal scalp hematoma, unchanged from the  prior study.  IMPRESSION: Incomplete study.  The patient was not able to cooperate for the examination.  No acute infarct is identified.  Original Report Authenticated By: Camelia Phenes, M.D.   Scheduled Meds:   . amantadine  100 mg Oral Daily  . aspirin  325 mg Oral Daily  . beta carotene w/minerals  1 tablet Oral Daily  . carbidopa-levodopa  0.5 tablet Oral QHS  . carbidopa-levodopa  1 tablet Oral Daily  . clonazePAM  0.5 mg Oral QHS  . docusate sodium  100 mg Oral BID  . feeding supplement  237 mL Oral Q1500  . heparin  5,000 Units Subcutaneous Q8H  . insulin aspart  0-9 Units Subcutaneous TID WC  . metoprolol tartrate  25 mg Oral BID  . mirabegron ER  50 mg Oral Daily  . omega-3 acid ethyl esters  2 g Oral QHS  . pantoprazole  40 mg Oral Q0600  . polyethylene glycol  17 g Oral BID  . pramipexole  0.125 mg Oral TID  . sodium chloride  3 mL Intravenous Q12H   Continuous Infusions:   . DISCONTD: sodium chloride 1,000 mL (04/27/12 1034)    Principal Problem:  *Encephalopathy acute Active Problems:  Hypercalcemia  Renal failure (ARF), acute on chronic  Dehydration  DM (diabetes mellitus)  Anxiety  Falls  HTN (hypertension)  Parkinson's disease

## 2012-04-29 LAB — URINE CULTURE
Colony Count: >=100000
Culture  Setup Time: 201306252349

## 2012-05-23 ENCOUNTER — Emergency Department (HOSPITAL_COMMUNITY): Payer: Medicare Other

## 2012-05-23 ENCOUNTER — Other Ambulatory Visit: Payer: Self-pay

## 2012-05-23 ENCOUNTER — Encounter (HOSPITAL_COMMUNITY): Payer: Self-pay | Admitting: Emergency Medicine

## 2012-05-23 ENCOUNTER — Inpatient Hospital Stay (HOSPITAL_COMMUNITY)
Admission: EM | Admit: 2012-05-23 | Discharge: 2012-05-24 | DRG: 309 | Disposition: A | Discharge status: Home Health | Payer: Medicare Other | Attending: Internal Medicine | Admitting: Internal Medicine

## 2012-05-23 ENCOUNTER — Inpatient Hospital Stay (HOSPITAL_COMMUNITY): Payer: Medicare Other

## 2012-05-23 DIAGNOSIS — I129 Hypertensive chronic kidney disease with stage 1 through stage 4 chronic kidney disease, or unspecified chronic kidney disease: Secondary | ICD-10-CM | POA: Diagnosis present

## 2012-05-23 DIAGNOSIS — I2510 Atherosclerotic heart disease of native coronary artery without angina pectoris: Secondary | ICD-10-CM | POA: Diagnosis present

## 2012-05-23 DIAGNOSIS — Y92009 Unspecified place in unspecified non-institutional (private) residence as the place of occurrence of the external cause: Secondary | ICD-10-CM

## 2012-05-23 DIAGNOSIS — E785 Hyperlipidemia, unspecified: Secondary | ICD-10-CM

## 2012-05-23 DIAGNOSIS — G9340 Encephalopathy, unspecified: Secondary | ICD-10-CM

## 2012-05-23 DIAGNOSIS — G20 Parkinson's disease: Secondary | ICD-10-CM | POA: Diagnosis present

## 2012-05-23 DIAGNOSIS — K0889 Other specified disorders of teeth and supporting structures: Secondary | ICD-10-CM

## 2012-05-23 DIAGNOSIS — R001 Bradycardia, unspecified: Secondary | ICD-10-CM

## 2012-05-23 DIAGNOSIS — K279 Peptic ulcer, site unspecified, unspecified as acute or chronic, without hemorrhage or perforation: Secondary | ICD-10-CM

## 2012-05-23 DIAGNOSIS — T465X5A Adverse effect of other antihypertensive drugs, initial encounter: Secondary | ICD-10-CM | POA: Diagnosis present

## 2012-05-23 DIAGNOSIS — W19XXXA Unspecified fall, initial encounter: Secondary | ICD-10-CM

## 2012-05-23 DIAGNOSIS — N179 Acute kidney failure, unspecified: Secondary | ICD-10-CM

## 2012-05-23 DIAGNOSIS — I10 Essential (primary) hypertension: Secondary | ICD-10-CM

## 2012-05-23 DIAGNOSIS — E2740 Unspecified adrenocortical insufficiency: Secondary | ICD-10-CM | POA: Diagnosis present

## 2012-05-23 DIAGNOSIS — I498 Other specified cardiac arrhythmias: Secondary | ICD-10-CM

## 2012-05-23 DIAGNOSIS — E876 Hypokalemia: Secondary | ICD-10-CM

## 2012-05-23 DIAGNOSIS — E860 Dehydration: Secondary | ICD-10-CM

## 2012-05-23 DIAGNOSIS — K2270 Barrett's esophagus without dysplasia: Secondary | ICD-10-CM

## 2012-05-23 DIAGNOSIS — E119 Type 2 diabetes mellitus without complications: Secondary | ICD-10-CM | POA: Diagnosis present

## 2012-05-23 DIAGNOSIS — K5900 Constipation, unspecified: Secondary | ICD-10-CM

## 2012-05-23 DIAGNOSIS — I7300 Raynaud's syndrome without gangrene: Secondary | ICD-10-CM | POA: Diagnosis present

## 2012-05-23 DIAGNOSIS — Z8551 Personal history of malignant neoplasm of bladder: Secondary | ICD-10-CM

## 2012-05-23 DIAGNOSIS — N184 Chronic kidney disease, stage 4 (severe): Secondary | ICD-10-CM | POA: Diagnosis present

## 2012-05-23 DIAGNOSIS — M797 Fibromyalgia: Secondary | ICD-10-CM

## 2012-05-23 DIAGNOSIS — IMO0001 Reserved for inherently not codable concepts without codable children: Secondary | ICD-10-CM | POA: Diagnosis present

## 2012-05-23 DIAGNOSIS — R600 Localized edema: Secondary | ICD-10-CM | POA: Diagnosis present

## 2012-05-23 DIAGNOSIS — G20A1 Parkinson's disease without dyskinesia, without mention of fluctuations: Secondary | ICD-10-CM | POA: Diagnosis present

## 2012-05-23 DIAGNOSIS — E2749 Other adrenocortical insufficiency: Secondary | ICD-10-CM | POA: Diagnosis present

## 2012-05-23 DIAGNOSIS — R609 Edema, unspecified: Secondary | ICD-10-CM | POA: Diagnosis present

## 2012-05-23 DIAGNOSIS — F419 Anxiety disorder, unspecified: Secondary | ICD-10-CM

## 2012-05-23 LAB — BLOOD GAS, ARTERIAL
Acid-base deficit: 1.3 mmol/L (ref 0.0–2.0)
Bicarbonate: 23.7 mEq/L (ref 20.0–24.0)
Patient temperature: 98.6
TCO2: 22.1 mmol/L (ref 0–100)
pH, Arterial: 7.356 (ref 7.350–7.450)

## 2012-05-23 LAB — PHOSPHORUS: Phosphorus: 3.6 mg/dL (ref 2.3–4.6)

## 2012-05-23 LAB — CBC WITH DIFFERENTIAL/PLATELET
Basophils Relative: 2 % — ABNORMAL HIGH (ref 0–1)
Eosinophils Absolute: 0.2 10*3/uL (ref 0.0–0.7)
MCH: 28.6 pg (ref 26.0–34.0)
MCHC: 32.3 g/dL (ref 30.0–36.0)
Neutro Abs: 2.2 10*3/uL (ref 1.7–7.7)
Neutrophils Relative %: 52 % (ref 43–77)
Platelets: 292 10*3/uL (ref 150–400)
RBC: 4.09 MIL/uL (ref 3.87–5.11)

## 2012-05-23 LAB — MAGNESIUM: Magnesium: 2.4 mg/dL (ref 1.5–2.5)

## 2012-05-23 LAB — BASIC METABOLIC PANEL
BUN: 43 mg/dL — ABNORMAL HIGH (ref 6–23)
Calcium: 9.6 mg/dL (ref 8.4–10.5)
GFR calc Af Amer: 45 mL/min — ABNORMAL LOW (ref >90)
GFR calc non Af Amer: 39 mL/min — ABNORMAL LOW (ref >90)
Glucose, Bld: 80 mg/dL (ref 70–99)
Sodium: 137 mEq/L (ref 135–145)

## 2012-05-23 LAB — CK TOTAL AND CKMB (NOT AT ARMC)
CK, MB: 4.2 ng/mL — ABNORMAL HIGH (ref 0.3–4.0)
Relative Index: 3.1 — ABNORMAL HIGH (ref 0.0–2.5)
Total CK: 136 U/L (ref 7–177)

## 2012-05-23 MED ORDER — ASPIRIN 81 MG PO CHEW
162.0000 mg | CHEWABLE_TABLET | Freq: Once | ORAL | Status: AC
  Administered 2012-05-23: 162 mg via ORAL
  Filled 2012-05-23: qty 2

## 2012-05-23 MED ORDER — GLUCAGON HCL (RDNA) 1 MG IJ SOLR: 5.0000 mg | Freq: Once | INTRAVENOUS | Status: DC

## 2012-05-23 NOTE — ED Notes (Signed)
Per EMS, pt from home and cared for by husband. Pt with new onset lethargy and upon EMS assess pt with HR 30-47.

## 2012-05-23 NOTE — ED Notes (Signed)
Per EMS, pt from home with new onset bradycardia and lethargy

## 2012-05-24 ENCOUNTER — Inpatient Hospital Stay (HOSPITAL_COMMUNITY): Payer: Medicare Other

## 2012-05-24 ENCOUNTER — Encounter (HOSPITAL_COMMUNITY): Payer: Self-pay | Admitting: *Deleted

## 2012-05-24 DIAGNOSIS — F411 Generalized anxiety disorder: Secondary | ICD-10-CM

## 2012-05-24 DIAGNOSIS — I498 Other specified cardiac arrhythmias: Principal | ICD-10-CM

## 2012-05-24 DIAGNOSIS — I2510 Atherosclerotic heart disease of native coronary artery without angina pectoris: Secondary | ICD-10-CM

## 2012-05-24 LAB — URINALYSIS, ROUTINE W REFLEX MICROSCOPIC
Bilirubin Urine: NEGATIVE
Glucose, UA: NEGATIVE mg/dL
Hgb urine dipstick: NEGATIVE
Ketones, ur: NEGATIVE mg/dL
Protein, ur: NEGATIVE mg/dL

## 2012-05-24 LAB — TSH: TSH: 2.347 u[IU]/mL (ref 0.350–4.500)

## 2012-05-24 LAB — BASIC METABOLIC PANEL
BUN: 41 mg/dL — ABNORMAL HIGH (ref 6–23)
Calcium: 9.4 mg/dL (ref 8.4–10.5)
GFR calc Af Amer: 42 mL/min — ABNORMAL LOW (ref >90)
GFR calc non Af Amer: 36 mL/min — ABNORMAL LOW (ref >90)
Glucose, Bld: 92 mg/dL (ref 70–99)
Sodium: 139 mEq/L (ref 135–145)

## 2012-05-24 LAB — CBC
MCH: 28.4 pg (ref 26.0–34.0)
MCHC: 32 g/dL (ref 30.0–36.0)
Platelets: 275 10*3/uL (ref 150–400)

## 2012-05-24 LAB — T3, FREE: T3, Free: 2.7 pg/mL (ref 2.3–4.2)

## 2012-05-24 LAB — TROPONIN I: Troponin I: <0.3 ng/mL (ref <0.30)

## 2012-05-24 MED ORDER — ALBUTEROL SULFATE (5 MG/ML) 0.5% IN NEBU: 2.5000 mg | INHALATION_SOLUTION | RESPIRATORY_TRACT | Status: DC | PRN

## 2012-05-24 MED ORDER — HEPARIN SODIUM (PORCINE) 5000 UNIT/ML IJ SOLN
5000.0000 [IU] | Freq: Three times a day (TID) | INTRAMUSCULAR | Status: DC
  Administered 2012-05-24: 5000 [IU] via SUBCUTANEOUS
  Filled 2012-05-24 (×4): qty 1

## 2012-05-24 MED ORDER — PRAMIPEXOLE DIHYDROCHLORIDE 0.25 MG PO TABS
0.3750 mg | ORAL_TABLET | Freq: Every day | ORAL | Status: DC
  Administered 2012-05-24: 0.375 mg via ORAL
  Filled 2012-05-24: qty 1

## 2012-05-24 MED ORDER — MIRABEGRON ER 50 MG PO TB24
50.0000 mg | ORAL_TABLET | Freq: Every day | ORAL | Status: DC
  Administered 2012-05-24: 50 mg via ORAL
  Filled 2012-05-24: qty 1

## 2012-05-24 MED ORDER — AMOXICILLIN 500 MG PO CAPS
500.0000 mg | ORAL_CAPSULE | Freq: Four times a day (QID) | ORAL | Status: DC
  Administered 2012-05-24 (×2): 500 mg via ORAL
  Filled 2012-05-24 (×5): qty 1

## 2012-05-24 MED ORDER — CARBIDOPA-LEVODOPA 25-100 MG PO TABS
1.0000 | ORAL_TABLET | Freq: Every day | ORAL | Status: DC
  Administered 2012-05-24: 1 via ORAL
  Filled 2012-05-24: qty 1

## 2012-05-24 MED ORDER — AMLODIPINE BESYLATE 10 MG PO TABS: 10.0000 mg | ORAL_TABLET | Freq: Every day | ORAL | Status: DC

## 2012-05-24 MED ORDER — GUAIFENESIN-DM 100-10 MG/5ML PO SYRP: 5.0000 mL | ORAL_SOLUTION | ORAL | Status: DC | PRN

## 2012-05-24 MED ORDER — AMLODIPINE BESYLATE 10 MG PO TABS
10.0000 mg | ORAL_TABLET | Freq: Every day | ORAL | Status: DC
  Administered 2012-05-24: 10 mg via ORAL
  Filled 2012-05-24 (×2): qty 1

## 2012-05-24 MED ORDER — OCUVITE PO TABS
1.0000 | ORAL_TABLET | Freq: Every day | ORAL | Status: DC
  Administered 2012-05-24: 1 via ORAL
  Filled 2012-05-24: qty 1

## 2012-05-24 MED ORDER — AMANTADINE HCL 100 MG PO CAPS
100.0000 mg | ORAL_CAPSULE | Freq: Every day | ORAL | Status: DC
  Administered 2012-05-24: 100 mg via ORAL
  Filled 2012-05-24: qty 1

## 2012-05-24 MED ORDER — ENSURE COMPLETE PO LIQD: 237.0000 mL | Freq: Two times a day (BID) | ORAL | Status: DC

## 2012-05-24 MED ORDER — PANTOPRAZOLE SODIUM 40 MG PO TBEC
40.0000 mg | DELAYED_RELEASE_TABLET | Freq: Every day | ORAL | Status: DC
  Administered 2012-05-24: 40 mg via ORAL
  Filled 2012-05-24: qty 1

## 2012-05-24 MED ORDER — DOPAMINE-DEXTROSE 3.2-5 MG/ML-% IV SOLN: 5.0000 ug/kg/min | INTRAVENOUS | Status: DC | PRN

## 2012-05-24 MED ORDER — ENSURE COMPLETE PO LIQD: 237.0000 mL | Freq: Every day | ORAL | Status: DC

## 2012-05-24 MED ORDER — ONDANSETRON HCL 4 MG PO TABS: 4.0000 mg | ORAL_TABLET | Freq: Four times a day (QID) | ORAL | Status: DC | PRN

## 2012-05-24 MED ORDER — PRAMIPEXOLE DIHYDROCHLORIDE ER 0.375 MG PO TB24
1.0000 | ORAL_TABLET | Freq: Every morning | ORAL | Status: DC
Start: 2012-05-24 — End: 2012-05-24

## 2012-05-24 MED ORDER — SODIUM CHLORIDE 0.9 % IJ SOLN: 3.0000 mL | Freq: Two times a day (BID) | INTRAMUSCULAR | Status: DC

## 2012-05-24 MED ORDER — ASPIRIN 325 MG PO TABS
325.0000 mg | ORAL_TABLET | Freq: Every day | ORAL | Status: DC
  Administered 2012-05-24: 325 mg via ORAL
  Filled 2012-05-24: qty 1

## 2012-05-24 MED ORDER — TRAMADOL HCL 50 MG PO TABS
50.0000 mg | ORAL_TABLET | Freq: Two times a day (BID) | ORAL | Status: DC | PRN
  Administered 2012-05-24: 50 mg via ORAL
  Filled 2012-05-24: qty 1

## 2012-05-24 MED ORDER — CARBIDOPA-LEVODOPA 25-100 MG PO TABS
0.5000 | ORAL_TABLET | Freq: Every day | ORAL | Status: DC
  Filled 2012-05-24: qty 0.5

## 2012-05-24 MED ORDER — FUROSEMIDE 20 MG PO TABS
20.0000 mg | ORAL_TABLET | Freq: Every day | ORAL | Status: DC
  Filled 2012-05-24: qty 1

## 2012-05-24 MED ORDER — CLONAZEPAM 0.5 MG PO TABS: 0.5000 mg | ORAL_TABLET | Freq: Every day | ORAL | Status: DC

## 2012-05-24 MED ORDER — ONDANSETRON HCL 4 MG/2ML IJ SOLN: 4.0000 mg | Freq: Four times a day (QID) | INTRAMUSCULAR | Status: DC | PRN

## 2012-05-24 MED FILL — Amlodipine Besylate Tab 10 MG (Base Equivalent): ORAL | Qty: 2 | Status: AC

## 2012-05-24 MED FILL — Heparin Sodium (Porcine) Inj 5000 Unit/ML: INTRAMUSCULAR | Qty: 1 | Status: AC

## 2012-05-24 MED FILL — Amoxicillin (Trihydrate) Cap 500 MG: ORAL | Qty: 1 | Status: AC

## 2012-05-24 MED FILL — Furosemide Tab 40 MG: ORAL | Qty: 1 | Status: AC

## 2012-05-24 NOTE — H&P (Signed)
[facility]                                                                                   This H&P was initiated on 05/23/2012 however do to software/Epic malfunction note was not safe to an H&P has been done on 05/24/2012.   Patient Demographics  [name], is a 78 y.o. female  CSN: [id number]  MRN: [id number]  DOB - [birth year]  Admit Date - 05/23/2012  Outpatient Primary MD for the patient is Allean Found, MD   With History of -  Past Medical History  Diagnosis Date  . MI, old   . Parkinson disease   . Scoliosis   . Arthritis   . Bladder cancer   . HTN (hypertension) 04/21/2012  . Hyperlipidemia 04/21/2012  . DM (diabetes mellitus) 04/21/2012  . CAD (coronary artery disease) 04/21/2012  . Barrett esophagus 04/21/2012  . PUD (peptic ulcer disease) 04/21/2012  . Adrenal insufficiency 04/21/2012  . Raynaud's disease 04/21/2012  . Anxiety 04/21/2012  . Fibromyalgia 04/21/2012  . Hx of bladder cancer 04/21/2012  . Edema of lower extremity 04/21/2012    Chronic lower extremity edema  . Falls 04/21/2012      Past Surgical History  Procedure Date  . Tonsillectomy   . Appendectomy   . Abdominal hysterectomy     in for   Chief Complaint  Patient presents with  . Bradycardia     HPI  [name]  is a 78 y.o. female, with history of coronary artery disease status post stent placement x2 by Dr. Verdis Prime of [facility] cardiology, Parkinson's disease, generalized weakness and deconditioning, who was recently admitted for acute renal failure and hypercalcemia due to excessive calcium supplementation about one month ago to this hospital, she was subsequently discharged to nursing home and went home about 7 days ago. Patient has been gradually getting weak over the past 6-7 days she was able to walk a few feet with a walker but now unable to do so, she says that her weakness got worse after she developed a left-sided jaw pain, she called her dentist and was  prescribed amoxicillin yesterday after which she is actually started feeling better and her jaw pain is now resolved.  He shouldn't was brought in by family members after she continued to get more week, in the ER she was found to have a heart rate in low 40s and I was called to admit the patient for bradycardia.    Review of Systems    In addition to the HPI above, No Fever-chills, No Headache, No changes with Vision or hearing, No problems swallowing food or Liquids, No Chest pain, Cough or Shortness of Breath, No Abdominal pain, No Nausea or Vommitting, Bowel movements are regular, No Blood in stool or Urine, No dysuria, No new skin rashes or bruises, No new joints pains-aches,  No new weakness, tingling, numbness in any extremity, feels weak all over No recent weight gain or loss, No polyuria, polydypsia or polyphagia, No significant Mental Stressors.  A full 10 point Review of Systems was done, except as stated above, all other Review of Systems were negative.   Social  History History  Substance Use Topics  . Smoking status: Never Smoker   . Smokeless tobacco: Not on file  . Alcohol Use: No     Family History CAD and CHF in patient's mother and grandmother  Prior to Admission medications   Medication Sig Start Date End Date Taking? Authorizing Provider  amantadine (SYMMETREL) 100 MG capsule Take 100 mg by mouth daily.    Yes Historical Provider, MD  amoxicillin (AMOXIL) 500 MG capsule Take 500 mg by mouth every 6 (six) hours.   Yes Historical Provider, MD  aspirin 325 MG tablet Take 325 mg by mouth daily.   Yes Historical Provider, MD  carbidopa-levodopa (SINEMET IR) 25-100 MG per tablet Take 0.5-1 tablets by mouth 2 (two) times daily. 1 tab in the morning and 0.5 tab at bedtime   Yes Historical Provider, MD  clonazePAM (KLONOPIN) 0.5 MG tablet Take 0.5 mg by mouth at bedtime.   Yes Historical Provider, MD  feeding supplement (ENSURE COMPLETE) LIQD Take 237 mLs by  mouth daily at 3 pm.   Yes Historical Provider, MD  furosemide (LASIX) 40 MG tablet Take 40 mg by mouth 3 (three) times a week. On Monday, Wednesday, and Friday.   Yes Historical Provider, MD  metoprolol tartrate (LOPRESSOR) 25 MG tablet Take 25 mg by mouth 2 (two) times daily.   Yes Historical Provider, MD  mirabegron ER (MYRBETRIQ) 50 MG TB24 Take 50 mg by mouth daily.    Yes Historical Provider, MD  Multiple Vitamins-Minerals (OCUVITE PO) Take 1 tablet by mouth every evening.   Yes Historical Provider, MD  omeprazole (PRILOSEC) 20 MG capsule Take 20 mg by mouth daily.   Yes Historical Provider, MD  Pramipexole Dihydrochloride (MIRAPEX ER) 0.375 MG TB24 Take 1 tablet by mouth every morning.   Yes Historical Provider, MD  traMADol (ULTRAM) 50 MG tablet Take 50 mg by mouth daily as needed. For pain   Yes Historical Provider, MD    Allergies  Allergen Reactions  . Codeine Hives    Physical Exam  Vitals  Blood pressure 126/57, pulse 48, temperature 98 F (36.7 C), temperature source Oral, resp. rate 16, height 4\' 8"  (1.422 m), weight 50.3 kg (110 lb 14.3 oz), SpO2 100.00%.   1. General frail elderly white female with baseline tremors right side more than left lying in bed in NAD but appears tired  2. Normal affect and insight, Not Suicidal or Homicidal, Awake but not alert.  3. No F.N deficits, ALL C.Nerves Intact, Strength 5/5 all 4 extremities, Sensation intact all 4 extremities, Plantars down going.  4. Ears and Eyes appear Normal, Conjunctivae clear, PERRLA. Moist Oral Mucosa.  5. Supple Neck, No JVD, No cervical lymphadenopathy appriciated, No Carotid Bruits.  6. Symmetrical Chest wall movement, Good air movement bilaterally, CTAB.  7. RRR, No Gallops, Rubs or Murmurs, No Parasternal Heave.  8. Positive Bowel Sounds, Abdomen Soft, Non tender, No organomegaly appriciated,No rebound -guarding or rigidity.  9.  No Cyanosis, Normal Skin Turgor, No Skin Rash or Bruise.  10.  Good muscle tone,  joints appear normal , no effusions, Normal ROM.  11. No Palpable Lymph Nodes in Neck or Axillae    Data Review  CBC  Lab 05/24/12 0446 05/23/12 1350  WBC 4.8 4.2  HGB 10.7* 11.7*  HCT 33.4* 36.2  PLT 275 292  MCV 88.6 88.5  MCH 28.4 28.6  MCHC 32.0 32.3  RDW 16.7* 16.9*  LYMPHSABS -- 1.2  MONOABS -- 0.5  EOSABS --  0.2  BASOSABS -- 0.1  BANDABS -- --   ------------------------------------------------------------------------------------------------------------------  Chemistries   Lab 05/24/12 0446 05/23/12 1350  NA 139 137  K 4.6 4.8  CL 103 102  CO2 26 25  GLUCOSE 92 80  BUN 41* 43*  CREATININE 1.32* 1.25*  CALCIUM 9.4 9.6  MG -- 2.4  AST -- --  ALT -- --  ALKPHOS -- --  BILITOT -- --   ------------------------------------------------------------------------------------------------------------------ estimated creatinine clearance is 21.7 ml/min (by C-G formula based on Cr of 1.32). ------------------------------------------------------------------------------------------------------------------  Basename 05/23/12 1807  TSH 2.347  T4TOTAL --  T3FREE 2.7  THYROIDAB --     Coagulation profile No results found for this basename: INR:5,PROTIME:5 in the last 168 hours ------------------------------------------------------------------------------------------------------------------- No results found for this basename: DDIMER:2 in the last 72 hours -------------------------------------------------------------------------------------------------------------------  Cardiac Enzymes  Lab 05/24/12 0446 05/23/12 1350  CKMB -- 4.2*  TROPONINI <0.30 --  MYOGLOBIN -- --   ------------------------------------------------------------------------------------------------------------------ No components found with this basename:  POCBNP:3   ---------------------------------------------------------------------------------------------------------------  Urinalysis    Component Value Date/Time   COLORURINE YELLOW 05/24/2012 0452   APPEARANCEUR CLEAR 05/24/2012 0452   LABSPEC 1.010 05/24/2012 0452   PHURINE 6.0 05/24/2012 0452   GLUCOSEU NEGATIVE 05/24/2012 0452   HGBUR NEGATIVE 05/24/2012 0452   BILIRUBINUR NEGATIVE 05/24/2012 0452   KETONESUR NEGATIVE 05/24/2012 0452   PROTEINUR NEGATIVE 05/24/2012 0452   UROBILINOGEN 0.2 05/24/2012 0452   NITRITE NEGATIVE 05/24/2012 0452   LEUKOCYTESUR NEGATIVE 05/24/2012 0452       Imaging results:   Dg Orthopantogram  05/23/2012  *RADIOLOGY REPORT*  Clinical Data: Toothache  ORTHOPANTOGRAM/PANORAMIC  Comparison: None.  Findings: Motion degraded images.  Numerous missing teeth.  Lower dental bridge.  No periapical lucencies are seen.  No gross fracture is seen.  The mandibular condyles appear well seated in the TMJs.  IMPRESSION: Motion degraded images.  No periapical lucencies are seen.  No gross fracture is seen.  Original Report Authenticated By: Charline Bills, M.D.   Dg Chest 2 View  05/23/2012  *RADIOLOGY REPORT*  Clinical Data: Chest pain  CHEST - 2 VIEW  Comparison: April 21, 2012.  Findings: Cardiomediastinal silhouette appears normal.  No acute pulmonary disease is noted.  Mild hiatal hernia is noted.  IMPRESSION: No acute cardiopulmonary abnormality seen.  Original Report Authenticated By: Venita Sheffield., M.D.   Ct Head Wo Contrast  05/23/2012  *RADIOLOGY REPORT*  Clinical Data: Bradycardia  CT HEAD WITHOUT CONTRAST  Technique:  Contiguous axial images were obtained from the base of the skull through the vertex without contrast.  Comparison: [place] CT head dated 04/21/2012  Findings: No evidence of parenchymal hemorrhage or extra-axial fluid collection. No mass lesion, mass effect, or midline shift.  No CT evidence of acute infarction.  Subcortical white matter and  periventricular small vessel ischemic changes.  Intracranial atherosclerosis.  Mild global cortical atrophy.  No ventriculomegaly.  The visualized paranasal sinuses are essentially clear. The mastoid air cells are unopacified.  No evidence of calvarial fracture.  IMPRESSION: No evidence of acute intracranial abnormality.  Atrophy with small vessel ischemic changes and intracranial atherosclerosis.  Original Report Authenticated By: Charline Bills, M.D.    My personal review of EKG:  bradycardia Baseline heart rate in low 40s no acute ST changes underlying rhythm could be a flutter versus frequent PACs  Assessment & Plan  1. Generalized weakness in a patient with underlying Parkinson's disease, and baseline deconditioning - worse due to to sinus bradycardia caused by Lopressor  which was recently started, he should is thyroid panel appears stable, cycled troponins are negative, Lopressor has been withheld, bradycardia seems to have resolved, she will be seen by PT OT and evaluated for placement by social worker.   2. Left-sided jaw pain- rays of the left jaw appears stable, patient has clinically responded to amoxicillin which was started by her dentist outpatient, will continue for 7 day course and that her follow outpatient with her dentist. She is already feeling better and pain has resolved.   3. History of Parkinson's disease, fibromyalgia- no acute issues home medications to be continued PT OT.   4. History of hypertension, coronary artery disease, dyslipidemia- he should is chest pain-free, EKG has no acute ST changes, cycled troponins are negative, aspirin will be continued, beta blocker has been stopped due to bradycardia, statin to be continued, Norvasc added for blood pressure control.   5. CK D. stage IV with Recent acute renal failure and hypercalcemia- calcium appears to be stable, continue to withhold oral calcium supplementation, monitor creatinine this appears to the patient's  baseline which is around 1.4-1.5    DVT Prophylaxis Heparin    AM Labs Ordered, also please review Full Orders  Family Communication: Admission, patients condition and plan of care including tests being ordered have been discussed with the patient, husband, daughter, son-in-law who indicate understanding and agree with the plan and Code Status.  Code Status DNR  Disposition Plan: likely SNF  Time spent in minutes : 60  Condition GUARDED   Leroy Sea M.D on 05/24/2012 at 8:33 AM  Between 7am to 7pm - Pager - [phone]  After 7pm go to [email or web address] - password [email or web address]  And look for the night coverage person covering me after hours  [facility] Office  [phone]

## 2012-05-24 NOTE — Progress Notes (Signed)
INITIAL ADULT NUTRITION ASSESSMENT Date: 05/24/2012   Time: 1:24 PM Reason for Assessment: Nutrition risk for dysphagia  ASSESSMENT: Female 78 y.o.  Dx: Bradycardia  Hx:  Past Medical History  Diagnosis Date  . MI, old   . Parkinson disease   . Scoliosis   . Arthritis   . Bladder cancer   . HTN (hypertension) 04/21/2012  . Hyperlipidemia 04/21/2012  . DM (diabetes mellitus) 04/21/2012  . CAD (coronary artery disease) 04/21/2012  . Barrett esophagus 04/21/2012  . PUD (peptic ulcer disease) 04/21/2012  . Adrenal insufficiency 04/21/2012  . Raynaud's disease 04/21/2012  . Anxiety 04/21/2012  . Fibromyalgia 04/21/2012  . Hx of bladder cancer 04/21/2012  . Edema of lower extremity 04/21/2012    Chronic lower extremity edema  . Falls 04/21/2012    Related Meds:  Scheduled Meds:   . amantadine  100 mg Oral Daily  . amLODipine  10 mg Oral Daily  . amoxicillin  500 mg Oral Q6H  . aspirin  162 mg Oral Once  . aspirin  325 mg Oral Daily  . beta carotene w/minerals  1 tablet Oral Daily  . carbidopa-levodopa  0.5 tablet Oral QHS  . carbidopa-levodopa  1 tablet Oral Daily  . clonazePAM  0.5 mg Oral QHS  . feeding supplement  237 mL Oral Q1500  . glucagon (GLUCAGEN) 5 MG IV  5 mg Intravenous Once  . heparin  5,000 Units Subcutaneous Q8H  . mirabegron ER  50 mg Oral Daily  . pantoprazole  40 mg Oral Q1200  . pramipexole  0.375 mg Oral Daily  . sodium chloride  3 mL Intravenous Q12H  . DISCONTD: furosemide  20 mg Oral Daily  . DISCONTD: Pramipexole Dihydrochloride  1 tablet Oral q morning - 10a   Continuous Infusions:   . DOPamine     PRN Meds:.albuterol, DOPamine, guaiFENesin-dextromethorphan, ondansetron (ZOFRAN) IV, ondansetron, traMADol   Ht: 4\' 8"  (142.2 cm) (noted patient with scoliosis)  Wt: 110 lb 14.3 oz (50.3 kg)  Ideal Wt: 42.42 kg (difficult to assess due to patient with scoliosis) % Ideal Wt: 118% Wt Readings from Last 10 Encounters:  05/24/12 110 lb 14.3 oz (50.3  kg)  04/27/12 115 lb 15.4 oz (52.6 kg)  09/04/11 116 lb (52.617 kg)  *5 lb weight loss over 1 month, 4.3% from baseline.   Usual Wt: 168 lb per previous RD note obtained from pt's husband and daughter.  % Usual Wt: 65.4%  Body mass index is 24.86 kg/(m^2). (WNL)  Food/Nutrition Related Hx: Patient reported her appetite has been good. She reported she eats about 50-75% of her meals. She reported to drink Ensure nutrition supplements 3 out of 5 days. She denies any problems with chewing or swallowing. Noted patient with recent admission on 6/19. Patient   Nutrition History obtained from RD on 04/21/2012: Pt intermittently confused. Husband and daughter at bedside report pt has lost >60 lbs over the past year. They attribute wt loss to decreased intake. Daughter reports pt has good appetite, however is experiencing early satiety at meals. She takes a small portion, then is not able to complete meal. Pt drinks up to 1-2 Ensures daily. Husband reports pt is drinking fluids at home- this has been a problem in the past; they recently obtained a new ice machine with crushed ice which pt really enjoys. Pt is drinking a Sprite at time of visit.  Pt has severe constipation. Bowel regimen is unknown, husband reports occasional use of laxatives. They state pt  will attempt to go to the bathroom several times per day, however is not typically successful. Pt occasionally falls asleep on the toilet.  Daughter reports weakness in arms making it difficult for pt to feed self.  Based on dietary recall from family, pt not likely meeting 75% of her estimated needs. Pt with 35% wt loss over the past year. Pt qualifies for severe malnutrition of chronic illness   Labs:  CMP     Component Value Date/Time   NA 139 05/24/2012 0446   K 4.6 05/24/2012 0446   CL 103 05/24/2012 0446   CO2 26 05/24/2012 0446   GLUCOSE 92 05/24/2012 0446   BUN 41* 05/24/2012 0446   CREATININE 1.32* 05/24/2012 0446   CALCIUM 9.4 05/24/2012 0446     CALCIUM 12.1* 04/21/2012 2233   PROT 6.1 04/27/2012 0520   ALBUMIN 3.3* 04/27/2012 0520   AST 28 04/27/2012 0520   ALT 6 04/27/2012 0520   ALKPHOS 79 04/27/2012 0520   BILITOT 0.4 04/27/2012 0520   GFRNONAA 36* 05/24/2012 0446   GFRAA 42* 05/24/2012 0446    Intake/Output Summary (Last 24 hours) at 05/24/12 1332 Last data filed at 05/24/12 0845  Gross per 24 hour  Intake    240 ml  Output   1600 ml  Net  -1360 ml     Diet Order: Cardiac  Supplements/Tube Feeding: none at this time  IVF:    DOPamine    Estimated Nutritional Needs:   Kcal: 4098-1191 Protein: 50-60 grams Fluid: 1 ml per kcal intake  NUTRITION DIAGNOSIS: -Inadequate oral intake (NI-2.1).  Status: Ongoing  RELATED TO: early satiety  AS EVIDENCE BY: patient reported PO intake of 50-75% at meals and 5 lb weight loss over 1 month.   MONITORING/EVALUATION(Goals): PO intake, weights, labs 1. PO intake > 75% at meals and supplements.  2. Minimize weight loss.   EDUCATION NEEDS: -No education needs identified at this time  INTERVENTION: 1. Will order patient Ensure BID. Provides 500 kcal and 18 grams of protein daily.  2. RD to follow for nutrition plan of care.   Dietitian [phone]  DOCUMENTATION CODES Per approved criteria  -Severe malnutrition in the context of chronic illness  *Patient meets malnutrition criteria due to significant weight loss of 35% from baseline over 1 year and PO intake not likely meeting 75% of estimated energy needs.   Iven Finn [facility] 05/24/2012, 1:24 PM

## 2012-05-24 NOTE — Evaluation (Signed)
Physical Therapy Evaluation Patient Details Name: [name] MRN: [id number] DOB: 04/15/1928 Today's Date: 05/24/2012 Time: 4098-1191 PT Time Calculation (min): 22 min  PT Assessment / Plan / Recommendation Clinical Impression  78 y.o. female with h/o Parkinson's and multiple falls admitted with bradycardia. Pt ambulated 180' with RW with min A to steer RW.  HHPT recommended for balance training due to h/o multiple falls, pt states her husband can assist at home. No DME needed. Pt would benefit from acute PT to maximize safety and independence with mobility.    PT Assessment  Patient needs continued PT services    Follow Up Recommendations  Home health PT;Supervision - Intermittent    Barriers to Discharge None      Equipment Recommendations  None recommended by OT;None recommended by PT    Recommendations for Other Services OT consult   Frequency Min 3X/week    Precautions / Restrictions Precautions Precautions: Fall Precaution Comments: h/o falls  Restrictions Weight Bearing Restrictions: No   Pertinent Vitals/Pain *4/10 back pain, pt premedicated**      Mobility  Bed Mobility Bed Mobility: Supine to Sit Supine to Sit: 4: Min guard;HOB elevated;With rails Details for Bed Mobility Assistance: min cues to use rail Transfers Sit to Stand: 4: Min assist;With upper extremity assist;From bed;From chair/3-in-1 Stand to Sit: 4: Min guard;With upper extremity assist;To chair/3-in-1;With armrests Details for Transfer Assistance: min VCs for hand placement.  Ambulation/Gait Ambulation/Gait Assistance: 4: Min assist Ambulation Distance (Feet): 180 Feet Assistive device: Rolling walker Ambulation/Gait Assistance Details: min A to steer RW and for safety due to h/o falls Gait Pattern: Decreased step length - left;Decreased step length - right    Exercises     PT Diagnosis: Difficulty walking  PT Problem List: Decreased balance;Pain PT Treatment Interventions: Gait  training;DME instruction;Functional mobility training;Balance training;Therapeutic exercise;Patient/family education   PT Goals Acute Rehab PT Goals PT Goal Formulation: With patient Time For Goal Achievement: 05/31/12 Potential to Achieve Goals: Fair Pt will go Sit to Stand: with supervision;with upper extremity assist PT Goal: Sit to Stand - Progress: Goal set today Pt will Ambulate: >150 feet;with supervision;with rolling walker PT Goal: Ambulate - Progress: Goal set today Pt will Perform Home Exercise Program: with supervision, verbal cues required/provided PT Goal: Perform Home Exercise Program - Progress: Goal set today  Visit Information  Last PT Received On: 05/24/12 Assistance Needed: +1    Subjective Data  Subjective: I sometimes black out and fall. Othertimes I fall when I can't get my leg to take a step. I don't want to go to a rehab facility. Patient Stated Goal: DC home   Prior Functioning  Home Living Lives With: Spouse Available Help at Discharge: Family Type of Home: House Home Access: Level entry Home Layout: One level Bathroom Shower/Tub: Health visitor: Handicapped height Home Adaptive Equipment: Built-in shower seat;Grab bars around toilet;Grab bars in shower;Bedside commode/3-in-1;Walker - four wheeled Prior Function Level of Independence: Needs assistance Needs Assistance: Bathing;Dressing Bath: Supervision/set-up Dressing: Supervision/set-up Driving: No Vocation: Retired Musician: No difficulties Dominant Hand: Right    Cognition  Overall Cognitive Status: Appears within functional limits for tasks assessed/performed Arousal/Alertness: Awake/alert Orientation Level: Appears intact for tasks assessed Behavior During Session: [facility] for tasks performed    Extremity/Trunk Assessment Right Upper Extremity Assessment RUE ROM/Strength/Tone:  (Pt slightly tremulous) Left Upper Extremity Assessment LUE  ROM/Strength/Tone:  (Pt slightly tremulous) Right Lower Extremity Assessment RLE ROM/Strength/Tone: WFL for tasks assessed RLE Sensation: WFL - Light Touch RLE Coordination:  WFL - gross/fine motor Left Lower Extremity Assessment LLE ROM/Strength/Tone: WFL for tasks assessed LLE Sensation: WFL - Light Touch LLE Coordination: WFL - gross/fine motor Trunk Assessment Trunk Assessment: Kyphotic (h/o scoliosis)   Balance Balance Balance Assessed: Yes Static Sitting Balance Static Sitting - Balance Support: No upper extremity supported;Feet supported Static Sitting - Level of Assistance: 5: Stand by assistance Static Standing Balance Static Standing - Balance Support: No upper extremity supported;During functional activity Static Standing - Level of Assistance: 4: Min assist  End of Session PT - End of Session Equipment Utilized During Treatment: Gait belt Activity Tolerance: Patient tolerated treatment well Patient left: in chair;with call bell/phone within reach Nurse Communication: Mobility status  GP     Ralene Bathe Kistler 05/24/2012, 11:17 AM [phone]

## 2012-05-24 NOTE — Evaluation (Signed)
Occupational Therapy Evaluation Patient Details Name: [name] MRN: [id number] DOB: January 16, 1928 Today's Date: 05/24/2012 Time: 1027-2536 OT Time Calculation (min): 24 min  OT Assessment / Plan / Recommendation Clinical Impression  Pt is an 78 yo female who presents with bradycardia. Pt is currently refusing snf at this time despite frequent falls at home. Pt also declining HH aide. Skilled OT indicated to maximize I w/BADLs to supervision level in prep for safe d/c home with 24/7 A and HHOT    OT Assessment  Patient needs continued OT Services    Follow Up Recommendations  Home health OT;Supervision/Assistance - 24 hour    Barriers to Discharge      Equipment Recommendations  None recommended by OT    Recommendations for Other Services    Frequency  Min 2X/week    Precautions / Restrictions Precautions Precautions: Fall   Pertinent Vitals/Pain HR remained steady in low 60s throughout.    ADL  Grooming: Performed;Wash/dry hands;Min guard Where Assessed - Grooming: Supported standing Upper Body Bathing: Simulated;Set up Where Assessed - Upper Body Bathing: Unsupported sitting Lower Body Bathing: Simulated;Minimal assistance Where Assessed - Lower Body Bathing: Supported sit to stand Upper Body Dressing: Simulated;Set up Where Assessed - Upper Body Dressing: Unsupported sitting Lower Body Dressing: Performed;Minimal assistance (to don/doff socks.) Where Assessed - Lower Body Dressing: Unsupported sitting Toilet Transfer: Performed;Minimal assistance Toilet Transfer Method: Sit to stand Toilet Transfer Equipment: Raised toilet seat with arms (or 3-in-1 over toilet) Toileting - Clothing Manipulation and Hygiene: Performed;Min guard Where Assessed - Toileting Clothing Manipulation and Hygiene: Sit to stand from 3-in-1 or toilet Equipment Used: Rolling walker;Gait belt Transfers/Ambulation Related to ADLs: Ambulated to bathroom with HHA then in the hallway with RW. Max  directional cues and safety manipulating RW. ADL Comments: Pt admits to having several falls at home. States some of them are due to blackouts and others due to LOB.    OT Diagnosis: Generalized weakness  OT Problem List: Decreased safety awareness;Decreased activity tolerance;Impaired balance (sitting and/or standing);Decreased knowledge of use of DME or AE OT Treatment Interventions: Self-care/ADL training;Therapeutic activities;DME and/or AE instruction;Patient/family education;Balance training   OT Goals Acute Rehab OT Goals OT Goal Formulation: With patient Time For Goal Achievement: 06/07/12 Potential to Achieve Goals: Good ADL Goals Pt Will Perform Grooming: with supervision;Standing at sink ADL Goal: Grooming - Progress: Goal set today Pt Will Perform Lower Body Bathing: with supervision;Sit to stand from chair;Sit to stand from bed ADL Goal: Lower Body Bathing - Progress: Goal set today Pt Will Perform Lower Body Dressing: with supervision;Sit to stand from bed;Sit to stand from chair ADL Goal: Lower Body Dressing - Progress: Goal set today Pt Will Transfer to Toilet: with supervision;3-in-1;Comfort height toilet ADL Goal: Toilet Transfer - Progress: Goal set today Pt Will Perform Toileting - Clothing Manipulation: with supervision;Standing ADL Goal: Toileting - Clothing Manipulation - Progress: Goal set today Pt Will Perform Toileting - Hygiene: with supervision;Sit to stand from 3-in-1/toilet ADL Goal: Toileting - Hygiene - Progress: Goal set today Pt Will Perform Tub/Shower Transfer: Shower transfer;Ambulation ADL Goal: Tub/Shower Transfer - Progress: Goal set today  Visit Information  Last OT Received On: 05/24/12 Assistance Needed: +1 PT/OT Co-Evaluation/Treatment: Yes    Subjective Data  Subjective: I try not to use my walker. Patient Stated Goal: Return home with assistance from husband.   Prior Functioning  Vision/Perception  Home Living Lives With:  Spouse Available Help at Discharge: Family Type of Home: House Home Access: Level entry Home Layout:  One level Bathroom Shower/Tub: Health visitor: Handicapped height Home Adaptive Equipment: Built-in shower seat;Grab bars around toilet;Grab bars in shower;Bedside commode/3-in-1;Walker - four wheeled Prior Function Level of Independence: Needs assistance Needs Assistance: Bathing;Dressing Bath: Supervision/set-up Dressing: Supervision/set-up Driving: No Vocation: Retired Musician: No difficulties Dominant Hand: Right      Cognition  Overall Cognitive Status: Appears within functional limits for tasks assessed/performed Arousal/Alertness: Awake/alert Orientation Level: Appears intact for tasks assessed Behavior During Session: [facility] for tasks performed    Extremity/Trunk Assessment Right Upper Extremity Assessment RUE ROM/Strength/Tone:  (Pt slightly tremulous) Left Upper Extremity Assessment LUE ROM/Strength/Tone:  (Pt slightly tremulous)   Mobility Bed Mobility Bed Mobility: Supine to Sit Supine to Sit: 4: Min guard;HOB elevated;With rails Details for Bed Mobility Assistance: min cues to use rail Transfers Transfers: Sit to Stand;Stand to Sit Sit to Stand: 4: Min assist;With upper extremity assist;From bed;From chair/3-in-1 Stand to Sit: 4: Min guard;With upper extremity assist;To chair/3-in-1;With armrests Details for Transfer Assistance: min VCs for hand placement.    Exercise    Balance Balance Balance Assessed: Yes Static Sitting Balance Static Sitting - Balance Support: No upper extremity supported;Feet supported Static Sitting - Level of Assistance: 5: Stand by assistance Static Standing Balance Static Standing - Balance Support: No upper extremity supported;During functional activity Static Standing - Level of Assistance: 4: Min assist  End of Session OT - End of Session Equipment Utilized During Treatment: Gait belt Activity  Tolerance: Patient tolerated treatment well Patient left: in chair;with call bell/phone within reach  GO     [name] A OTR/L [phone] 05/24/2012, 11:03 AM

## 2012-05-24 NOTE — Discharge Summary (Addendum)
[facility]                                                                                   [name], is a 78 y.o. female  DOB Oct 04, 1928  MRN [id number].  Admission date:  05/23/2012  Discharge Date:  05/24/2012  Primary MD  Allean Found, MD  Admitting Physician  Leroy Sea, MD  Admission Diagnosis  Bradycardia [427.89] BRADYCARDIA  Discharge Diagnosis     Principal Problem:  *Bradycardia Active Problems:  HTN (hypertension)  Hyperlipidemia  DM (diabetes mellitus)  Parkinson's disease  CAD (coronary artery disease)  PUD (peptic ulcer disease)  Adrenal insufficiency  Raynaud's disease  Fibromyalgia  Edema of lower extremity    Past Medical History  Diagnosis Date  . MI, old   . Parkinson disease   . Scoliosis   . Arthritis   . Bladder cancer   . HTN (hypertension) 04/21/2012  . Hyperlipidemia 04/21/2012  . DM (diabetes mellitus) 04/21/2012  . CAD (coronary artery disease) 04/21/2012  . Barrett esophagus 04/21/2012  . PUD (peptic ulcer disease) 04/21/2012  . Adrenal insufficiency 04/21/2012  . Raynaud's disease 04/21/2012  . Anxiety 04/21/2012  . Fibromyalgia 04/21/2012  . Hx of bladder cancer 04/21/2012  . Edema of lower extremity 04/21/2012    Chronic lower extremity edema  . Falls 04/21/2012    Past Surgical History  Procedure Date  . Tonsillectomy   . Appendectomy   . Abdominal hysterectomy     Recommendations for primary care physician for things to follow:   He is follow final urine culture results, repeat CBC BMP in a week.  Discharge Diagnoses:   Principal Problem:  *Bradycardia Active Problems:  HTN (hypertension)  Hyperlipidemia  DM (diabetes mellitus)  Parkinson's disease  CAD (coronary artery disease)  PUD (peptic ulcer disease)  Adrenal insufficiency  Raynaud's disease  Fibromyalgia  Edema of lower extremity    Discharge Condition: Stable   Diet recommendation: See Discharge Instructions  below   Consults heart urologist Dr. Mayford Knife over the phone who did not feel she had anything to offer an inpatient setting and recommended that patient follow with her primary cardiologist Dr. Verdis Prime tomorrow in the office.  History of present illness and  Hospital Course:  See H&P, Labs, Consult and Test reports for all details in brief, patient was admitted for nursing generalized weakness due to combination of severe bradycardia caused by Lopressor and possible low-grade dental infection in the left jaw(however limited x-ray of the jaw was negative for infection). Patient was treated with holding of Lopressor and continuation of her home dose amoxicillin which was ordered by her dentist a day prior to admission. Following which this morning patient is feeling much better she feels better than her recent baseline and eager to go home, and sitting bedside agrees. Should already has home health PT which will be continued. I discussed her case in detail with Dr. Mayford Knife cardiologist on call patient is having frequent PACs and PVCs on telemetry, or ever she remains completely symptom-free and does not feel any palpitations. Dr. Mayford Knife at this time recommends that patient should follow with Dr. Verdis Prime her  primary cardiologist in the office tomorrow. Sherilyn Cooter Smith's office was called and informed that patient will be calling soon and that an appointment for tomorrow should be provided. Her TSH free T3 free T4 and troponin which was checked this morning about 14 hours after the episode were unremarkable.   Agent has also been requested to follow with her dentist within the next 1 week, now we'll continue her outpatient amoxicillin which has already helped her and her jaw pain is completely resolved. Her orthpentogram was unremarkable although it was degraded by motion.    Chronic kidney disease stage IV patient's creatinine is better than her baseline of 1.4-1.5, home dose Lasix will be  continued.  Patient has Parkinson's disease with advanced baseline deconditioning, home medications and home PT will be continued with outpatient monitoring by PCP.  Or her dyslipidemia home dose statin will be continued no acute issues.   For her hypertension Lopressor was stopped she has been switched to Norvasc continue outpatient monitoring of blood pressure and adjust blood pressure medications as needed try to avoid beta blockers.    Today   Subjective:   [name] today has no headache,no chest abdominal pain,no new weakness tingling or numbness, feels much better wants to go home today.    Objective:   Blood pressure 126/57, pulse 60, temperature 98 F (36.7 C), temperature source Oral, resp. rate 16, height 4\' 8"  (1.422 m), weight 50.3 kg (110 lb 14.3 oz), SpO2 100.00%.   Intake/Output Summary (Last 24 hours) at 05/24/12 1220 Last data filed at 05/24/12 0845  Gross per 24 hour  Intake    240 ml  Output   1600 ml  Net  -1360 ml    Exam Awake Alert, Oriented *3, No new F.N deficits, Normal affect [place].AT,PERRAL Supple Neck,No JVD, No cervical lymphadenopathy appriciated.  Symmetrical Chest wall movement, Good air movement bilaterally, CTAB RRR,No Gallops,Rubs or new Murmurs, No Parasternal Heave +ve B.Sounds, Abd Soft, Non tender, No organomegaly appriciated, No rebound -guarding or rigidity. No Cyanosis, Clubbing or edema, No new Rash or bruise  Data Review      Radiology Reports - please see full reports for all details Dg Orthopantogram  05/23/2012  *RADIOLOGY REPORT*  Clinical Data: Toothache  ORTHOPANTOGRAM/PANORAMIC  Comparison: None.  Findings: Motion degraded images.  Numerous missing teeth.  Lower dental bridge.  No periapical lucencies are seen.  No gross fracture is seen.  The mandibular condyles appear well seated in the TMJs.  IMPRESSION: Motion degraded images.  No periapical lucencies are seen.  No gross fracture is seen.  Original Report  Authenticated By: Charline Bills, M.D.   Dg Chest 2 View  05/23/2012  *RADIOLOGY REPORT*  Clinical Data: Chest pain  CHEST - 2 VIEW  Comparison: April 21, 2012.  Findings: Cardiomediastinal silhouette appears normal.  No acute pulmonary disease is noted.  Mild hiatal hernia is noted.  IMPRESSION: No acute cardiopulmonary abnormality seen.  Original Report Authenticated By: Venita Sheffield., M.D.   Ct Head Wo Contrast  05/23/2012  *RADIOLOGY REPORT*  Clinical Data: Bradycardia  CT HEAD WITHOUT CONTRAST  Technique:  Contiguous axial images were obtained from the base of the skull through the vertex without contrast.  Comparison: [place] CT head dated 04/21/2012  Findings: No evidence of parenchymal hemorrhage or extra-axial fluid collection. No mass lesion, mass effect, or midline shift.  No CT evidence of acute infarction.  Subcortical white matter and periventricular small vessel ischemic changes.  Intracranial atherosclerosis.  Mild  global cortical atrophy.  No ventriculomegaly.  The visualized paranasal sinuses are essentially clear. The mastoid air cells are unopacified.  No evidence of calvarial fracture.  IMPRESSION: No evidence of acute intracranial abnormality.  Atrophy with small vessel ischemic changes and intracranial atherosclerosis.  Original Report Authenticated By: Charline Bills, M.D.    Micro Results  No results found for this or any previous visit (from the past 240 hour(s)).   CBC w Diff: Lab Results  Component Value Date   WBC 4.8 05/24/2012   HGB 10.7* 05/24/2012   HCT 33.4* 05/24/2012   PLT 275 05/24/2012   LYMPHOPCT 29 05/23/2012   MONOPCT 12 05/23/2012   EOSPCT 5 05/23/2012   BASOPCT 2* 05/23/2012    CMP: Lab Results  Component Value Date   NA 139 05/24/2012   K 4.6 05/24/2012   CL 103 05/24/2012   CO2 26 05/24/2012   BUN 41* 05/24/2012   CREATININE 1.32* 05/24/2012   PROT 6.1 04/27/2012   ALBUMIN 3.3* 04/27/2012   BILITOT 0.4 04/27/2012   ALKPHOS 79 04/27/2012    AST 28 04/27/2012   ALT 6 04/27/2012  .   Discharge Instructions     Follow with Primary MD Allean Found, MD in 3 days , follow with your dentist in a week and your cardiologist Dr. Verdis Prime tomorrow, to [facility] office has been called and informed these call this evening to set up exact time of the appointment.  Get CBC, CMP, checked 3 days by Primary MD and again as instructed by your Primary MD.    Get Medicines reviewed and adjusted.  Please request your Prim.MD to go over all Hospital Tests and Procedure/Radiological results at the follow up, please get all Hospital records sent to your Prim MD by signing hospital release before you go home.  Activity: As tolerated with Full fall precautions use walker/cane & assistance as needed   Diet:  Heart healthy diet with assistance and aspiration precautions  For Heart failure patients - Check your Weight same time everyday, if you gain over 2 pounds, or you develop in leg swelling, experience more shortness of breath or chest pain, call your Primary MD immediately. Follow Cardiac Low Salt Diet and 1.8 lit/day fluid restriction.  Disposition Home   If you experience worsening of your admission symptoms, develop shortness of breath, life threatening emergency, suicidal or homicidal thoughts you must seek medical attention immediately by calling 911 or calling your MD immediately  if symptoms less severe.  You Must read complete instructions/literature along with all the possible adverse reactions/side effects for all the Medicines you take and that have been prescribed to you. Take any new Medicines after you have completely understood and accpet all the possible adverse reactions/side effects.   Do not drive if your were admitted for syncope or siezures until you have seen by Primary MD or a Neurologist and advised to drive.  Do not drive when taking Pain medications.    Do not take more than prescribed Pain, Sleep and Anxiety  Medications  Special Instructions: If you have smoked or chewed Tobacco  in the last 2 yrs please stop smoking, stop any regular Alcohol  and or any Recreational drug use.  Wear Seat belts while driving.      Discharge Medications   Medication List  As of 05/24/2012 12:20 PM   START taking these medications         amLODipine 10 MG tablet   Commonly known as: NORVASC  Take 1 tablet (10 mg total) by mouth daily.         CONTINUE taking these medications         amantadine 100 MG capsule   Commonly known as: SYMMETREL      amoxicillin 500 MG capsule   Commonly known as: AMOXIL      aspirin 325 MG tablet      carbidopa-levodopa 25-100 MG per tablet   Commonly known as: SINEMET IR      clonazePAM 0.5 MG tablet   Commonly known as: KLONOPIN      feeding supplement Liqd      furosemide 40 MG tablet   Commonly known as: LASIX      MIRAPEX ER 0.375 MG Tb24   Generic drug: Pramipexole Dihydrochloride      MYRBETRIQ 50 MG Tb24   Generic drug: mirabegron ER      OCUVITE PO      omeprazole 20 MG capsule   Commonly known as: PRILOSEC      traMADol 50 MG tablet   Commonly known as: ULTRAM         STOP taking these medications         metoprolol tartrate 25 MG tablet          Where to get your medications    These are the prescriptions that you need to pick up. We sent them to a specific pharmacy, so you will need to go there to get them.   [id number] - [place], [place] - [address]    [address] [place] [address]    Phone: [phone]        amLODipine 10 MG tablet               Total Time in preparing paper work, data evaluation and todays exam - 35 minutes  Leroy Sea M.D on 05/24/2012 at 12:20 PM  [facility] Office  [phone]

## 2012-05-24 NOTE — Plan of Care (Signed)
Problem: Phase I Progression Outcomes Goal: Voiding-avoid urinary catheter unless indicated Outcome: Completed/Met Date Met:  05/24/12 Voids bsc without difficulty

## 2012-05-24 NOTE — Care Management Note (Signed)
    Page 1 of 1   05/24/2012     1:54:26 PM   CARE MANAGEMENT NOTE 05/24/2012  Patient:  [name], [name]   Account Number:  [id number]  Date Initiated:  05/24/2012  Documentation initiated by:  Lanier Clam  Subjective/Objective Assessment:   ADMITTED W/BRADYCARDIA.     Action/Plan:   FROM HOME W/SPOUSE. RECENT HOME VISIT FROM [facility] FOR START OF CARE -[facility]   Anticipated DC Date:  05/24/2012   Anticipated DC Plan:  HOME W HOME HEALTH SERVICES      DC Planning Services  CM consult      Choice offered to / List presented to:  C-1 Patient        HH arranged  HH-2 PT      [facility] agency  [facility]   Status of service:  Completed, signed off Medicare Important Message given?   (If response is "NO", the following Medicare IM given date fields will be blank) Date Medicare IM given:   Date Additional Medicare IM given:    Discharge Disposition:  HOME W HOME HEALTH SERVICES  Per UR Regulation:  Reviewed for med. necessity/level of care/duration of stay  If discussed at Long Length of Stay Meetings, dates discussed:    Comments:  05/24/12 Valita Righter RN,BSN NCM [phone] TC [facility] SPOKE TO [facility] INFORMED OF HHPT RECOMMENDATION.FAXED H&P,FACE SHEET,HH ORDER W/CONFIRMATION.

## 2012-05-25 LAB — URINE CULTURE: Colony Count: 6000

## 2012-05-26 NOTE — ED Provider Notes (Signed)
History     CSN: [id number]  Arrival date & time 05/23/12  1312   First MD Initiated Contact with Patient 05/23/12 1326      Chief Complaint  Patient presents with  . Bradycardia    (Consider location/radiation/quality/duration/timing/severity/associated sxs/prior treatment) HPI Comments: Pt with CAD comes in with cc of bradycardia. Per husband, patient had been feeling weak as of late, and today, she alsmost passed out when getting up, and hardly was able to move because of weakness. There is no syncope. Pt had no chest pain, palpitations, n/v/f/c. No headaches, visual complains. No cough or uri like symptoms.  EMS found patient to be alert, but bradycardic. Pt bradycardic in the ED as well, but aox3, and arousable, and without any chest pain, sob.  The history is provided by the patient.    Past Medical History  Diagnosis Date  . MI, old   . Parkinson disease   . Scoliosis   . Arthritis   . Bladder cancer   . HTN (hypertension) 04/21/2012  . Hyperlipidemia 04/21/2012  . DM (diabetes mellitus) 04/21/2012  . CAD (coronary artery disease) 04/21/2012  . Barrett esophagus 04/21/2012  . PUD (peptic ulcer disease) 04/21/2012  . Adrenal insufficiency 04/21/2012  . Raynaud's disease 04/21/2012  . Anxiety 04/21/2012  . Fibromyalgia 04/21/2012  . Hx of bladder cancer 04/21/2012  . Edema of lower extremity 04/21/2012    Chronic lower extremity edema  . Falls 04/21/2012    Past Surgical History  Procedure Date  . Tonsillectomy   . Appendectomy   . Abdominal hysterectomy     History reviewed. No pertinent family history.  History  Substance Use Topics  . Smoking status: Never Smoker   . Smokeless tobacco: Not on file  . Alcohol Use: No    OB History    Grav Para Term Preterm Abortions TAB SAB Ect Mult Living                  Review of Systems  Constitutional: Negative for activity change.  HENT: Negative for facial swelling and neck pain.   Respiratory: Negative for cough,  shortness of breath and wheezing.   Cardiovascular: Negative for chest pain.  Gastrointestinal: Negative for nausea, vomiting, abdominal pain, diarrhea, constipation, blood in stool and abdominal distention.  Genitourinary: Negative for hematuria and difficulty urinating.  Skin: Negative for color change.  Neurological: Positive for dizziness and weakness. Negative for speech difficulty.  Hematological: Does not bruise/bleed easily.  Psychiatric/Behavioral: Negative for confusion.  All other systems reviewed and are negative.    Allergies  Codeine  Home Medications   Current Outpatient Rx  Name Route Sig Dispense Refill  . AMANTADINE HCL 100 MG PO CAPS Oral Take 100 mg by mouth daily.     . AMOXICILLIN 500 MG PO CAPS Oral Take 500 mg by mouth every 6 (six) hours.    . ASPIRIN 325 MG PO TABS Oral Take 325 mg by mouth daily.    Marland Kitchen CARBIDOPA-LEVODOPA 25-100 MG PO TABS Oral Take 0.5-1 tablets by mouth 2 (two) times daily. 1 tab in the morning and 0.5 tab at bedtime    . CLONAZEPAM 0.5 MG PO TABS Oral Take 0.5 mg by mouth at bedtime.    [place] COMPLETE PO LIQD Oral Take 237 mLs by mouth daily at 3 pm.    . FUROSEMIDE 40 MG PO TABS Oral Take 40 mg by mouth 3 (three) times a week. On Monday, Wednesday, and Friday.    Marland Kitchen  MIRABEGRON ER 50 MG PO TB24 Oral Take 50 mg by mouth daily.     . OCUVITE PO Oral Take 1 tablet by mouth every evening.    Marland Kitchen OMEPRAZOLE 20 MG PO CPDR Oral Take 20 mg by mouth daily.    Marland Kitchen PRAMIPEXOLE DIHYDROCHLORIDE ER 0.375 MG PO TB24 Oral Take 1 tablet by mouth every morning.    Marland Kitchen TRAMADOL HCL 50 MG PO TABS Oral Take 50 mg by mouth daily as needed. For pain    . AMLODIPINE BESYLATE 10 MG PO TABS Oral Take 1 tablet (10 mg total) by mouth daily. 30 tablet 0    BP 126/57  Pulse 60  Temp 98 F (36.7 C) (Oral)  Resp 16  Ht 4\' 8"  (1.422 m)  Wt 110 lb 14.3 oz (50.3 kg)  BMI 24.86 kg/m2  SpO2 100%  Physical Exam  Constitutional: She is oriented to person, place, and  time. She appears well-developed and well-nourished.  HENT:  Head: Normocephalic and atraumatic.  Eyes: EOM are normal. Pupils are equal, round, and reactive to light.  Neck: Neck supple.  Cardiovascular: Regular rhythm and normal heart sounds.  Exam reveals no gallop and no friction rub.   No murmur heard.      bradycardia  Pulmonary/Chest: Effort normal. No respiratory distress.  Abdominal: Soft. She exhibits no distension. There is no tenderness. There is no rebound and no guarding.  Neurological: She is alert and oriented to person, place, and time.  Skin: Skin is warm and dry.    ED Course  Procedures (including critical care time)  Labs Reviewed  BASIC METABOLIC PANEL - Abnormal; Notable for the following:    BUN 43 (*)     Creatinine, Ser 1.25 (*)     GFR calc non Af Amer 39 (*)     GFR calc Af Amer 45 (*)     All other components within normal limits  CBC WITH DIFFERENTIAL - Abnormal; Notable for the following:    Hemoglobin 11.7 (*)     RDW 16.9 (*)     Basophils Relative 2 (*)     All other components within normal limits  CK TOTAL AND CKMB - Abnormal; Notable for the following:    CK, MB 4.2 (*)     Relative Index 3.1 (*)     All other components within normal limits  BLOOD GAS, ARTERIAL - Abnormal; Notable for the following:    pO2, Arterial 70.9 (*)     All other components within normal limits  BASIC METABOLIC PANEL - Abnormal; Notable for the following:    BUN 41 (*)     Creatinine, Ser 1.32 (*)     GFR calc non Af Amer 36 (*)     GFR calc Af Amer 42 (*)     All other components within normal limits  CBC - Abnormal; Notable for the following:    RBC 3.77 (*)     Hemoglobin 10.7 (*)     HCT 33.4 (*)     RDW 16.7 (*)     All other components within normal limits  MAGNESIUM  PHOSPHORUS  LACTIC ACID, PLASMA  URINALYSIS, ROUTINE W REFLEX MICROSCOPIC  URINE CULTURE  TSH  T3, FREE  T4, FREE  TROPONIN I  TROPONIN I  PROTIME-INR  LAB REPORT - SCANNED     No results found.   1. Bradycardia   2. Toothache   3. HTN (hypertension)   4. Hyperlipidemia   5. DM (  diabetes mellitus)   6. Parkinson's disease   7. CAD (coronary artery disease)   8. PUD (peptic ulcer disease)   9. Adrenal insufficiency   10. Raynaud's disease   11. Fibromyalgia   12. Edema of lower extremity   13. Encephalopathy acute   14. Hypercalcemia   15. Renal failure (ARF), acute on chronic   16. Dehydration   17. Barrett esophagus   18. Anxiety   19. Hx of bladder cancer   20. Falls   21. Hypokalemia   22. Constipation       MDM  Patient comes in with cc of lethargy. Aox3, no lethargy for [place]. Pt is bradycardic, but no symprmptomatic. ddx includes acs syndrome, hyperkalemia. If negative, will require admission - tele for cardiac workup and possible pace maker.          Derwood Kaplan, MD 05/26/12 [phone]

## 2012-09-27 DIAGNOSIS — B3749 Other urogenital candidiasis: Secondary | ICD-10-CM | POA: Insufficient documentation

## 2012-09-27 DIAGNOSIS — R413 Other amnesia: Secondary | ICD-10-CM | POA: Insufficient documentation

## 2012-09-27 DIAGNOSIS — F329 Major depressive disorder, single episode, unspecified: Secondary | ICD-10-CM | POA: Insufficient documentation

## 2012-09-27 DIAGNOSIS — R269 Unspecified abnormalities of gait and mobility: Secondary | ICD-10-CM | POA: Insufficient documentation

## 2012-09-27 DIAGNOSIS — F29 Unspecified psychosis not due to a substance or known physiological condition: Secondary | ICD-10-CM | POA: Insufficient documentation

## 2012-09-27 DIAGNOSIS — I959 Hypotension, unspecified: Secondary | ICD-10-CM | POA: Insufficient documentation

## 2012-11-14 ENCOUNTER — Emergency Department (HOSPITAL_BASED_OUTPATIENT_CLINIC_OR_DEPARTMENT_OTHER): Payer: Medicare Other

## 2012-11-14 ENCOUNTER — Encounter (HOSPITAL_BASED_OUTPATIENT_CLINIC_OR_DEPARTMENT_OTHER): Payer: Self-pay | Admitting: *Deleted

## 2012-11-14 ENCOUNTER — Emergency Department (HOSPITAL_BASED_OUTPATIENT_CLINIC_OR_DEPARTMENT_OTHER)
Admission: EM | Admit: 2012-11-14 | Discharge: 2012-11-15 | Disposition: A | Discharge status: Home / Self Care | Payer: Medicare Other | Attending: Emergency Medicine | Admitting: Emergency Medicine

## 2012-11-14 DIAGNOSIS — Z8551 Personal history of malignant neoplasm of bladder: Secondary | ICD-10-CM | POA: Insufficient documentation

## 2012-11-14 DIAGNOSIS — Z8739 Personal history of other diseases of the musculoskeletal system and connective tissue: Secondary | ICD-10-CM | POA: Insufficient documentation

## 2012-11-14 DIAGNOSIS — I10 Essential (primary) hypertension: Secondary | ICD-10-CM | POA: Insufficient documentation

## 2012-11-14 DIAGNOSIS — G20A1 Parkinson's disease without dyskinesia, without mention of fluctuations: Secondary | ICD-10-CM | POA: Insufficient documentation

## 2012-11-14 DIAGNOSIS — Z79899 Other long term (current) drug therapy: Secondary | ICD-10-CM | POA: Insufficient documentation

## 2012-11-14 DIAGNOSIS — R05 Cough: Secondary | ICD-10-CM | POA: Insufficient documentation

## 2012-11-14 DIAGNOSIS — R059 Cough, unspecified: Secondary | ICD-10-CM | POA: Insufficient documentation

## 2012-11-14 DIAGNOSIS — J189 Pneumonia, unspecified organism: Secondary | ICD-10-CM

## 2012-11-14 DIAGNOSIS — I252 Old myocardial infarction: Secondary | ICD-10-CM | POA: Insufficient documentation

## 2012-11-14 DIAGNOSIS — Z8709 Personal history of other diseases of the respiratory system: Secondary | ICD-10-CM | POA: Insufficient documentation

## 2012-11-14 DIAGNOSIS — R6884 Jaw pain: Secondary | ICD-10-CM | POA: Insufficient documentation

## 2012-11-14 DIAGNOSIS — Z8719 Personal history of other diseases of the digestive system: Secondary | ICD-10-CM | POA: Insufficient documentation

## 2012-11-14 DIAGNOSIS — I2510 Atherosclerotic heart disease of native coronary artery without angina pectoris: Secondary | ICD-10-CM | POA: Insufficient documentation

## 2012-11-14 DIAGNOSIS — E785 Hyperlipidemia, unspecified: Secondary | ICD-10-CM | POA: Insufficient documentation

## 2012-11-14 DIAGNOSIS — G20 Parkinson's disease: Secondary | ICD-10-CM | POA: Insufficient documentation

## 2012-11-14 DIAGNOSIS — E119 Type 2 diabetes mellitus without complications: Secondary | ICD-10-CM | POA: Insufficient documentation

## 2012-11-14 DIAGNOSIS — J159 Unspecified bacterial pneumonia: Secondary | ICD-10-CM | POA: Insufficient documentation

## 2012-11-14 LAB — CBC WITH DIFFERENTIAL/PLATELET
Basophils Absolute: 0 10*3/uL (ref 0.0–0.1)
Eosinophils Absolute: 0.3 10*3/uL (ref 0.0–0.7)
Eosinophils Relative: 4 % (ref 0–5)
Lymphocytes Relative: 13 % (ref 12–46)
MCH: 28.1 pg (ref 26.0–34.0)
MCV: 85.7 fL (ref 78.0–100.0)
Platelets: 303 10*3/uL (ref 150–400)
RDW: 14.4 % (ref 11.5–15.5)
WBC: 8.4 10*3/uL (ref 4.0–10.5)

## 2012-11-14 NOTE — ED Provider Notes (Addendum)
History   This chart was scribed for [name] Brinkman Smitty Cords, MD by Donne Anon, ED Scribe. This patient was seen in room MH04/MH04 and the patient's care was started at 2300.   CSN: [id number]  Arrival date & time 11/14/12  2202   First MD Initiated Contact with Patient 11/14/12 2300      Chief Complaint  Patient presents with  . Shortness of Breath    Patient is a 79 y.o. female presenting with shortness of breath. The history is provided by the patient and a relative. No language interpreter was used.  Shortness of Breath  The current episode started 5 to 7 days ago. The onset was gradual. The problem occurs continuously. The problem has been gradually worsening. The problem is moderate. Nothing relieves the symptoms. Nothing aggravates the symptoms. Associated symptoms include cough and shortness of breath. Pertinent negatives include no chest pain, no chest pressure, no orthopnea, no fever, no rhinorrhea, no sore throat, no stridor and no wheezing. There was no intake of a foreign body. She has not inhaled smoke recently. She has been behaving normally. Urine output has been normal.   [name] is a 79 y.o. female who presents to the Emergency Department complaining of gradual onset, moderate, worsening SOB which began 5 days ago. She reports associated dry cough, emesis while sleeping, increase sleeping, diarrhea (last episode 3 days ago). She denies wheezing, fever or any other pain. She has a history of Parkinson's disease.  Past Medical History  Diagnosis Date  . MI, old   . Parkinson disease   . Scoliosis   . Arthritis   . Bladder cancer   . HTN (hypertension) 04/21/2012  . Hyperlipidemia 04/21/2012  . DM (diabetes mellitus) 04/21/2012  . CAD (coronary artery disease) 04/21/2012  . Barrett esophagus 04/21/2012  . PUD (peptic ulcer disease) 04/21/2012  . Adrenal insufficiency 04/21/2012  . Raynaud's disease 04/21/2012  . Anxiety 04/21/2012  . Fibromyalgia 04/21/2012  . Hx of  bladder cancer 04/21/2012  . Edema of lower extremity 04/21/2012    Chronic lower extremity edema  . Falls 04/21/2012    Past Surgical History  Procedure Date  . Tonsillectomy   . Appendectomy   . Abdominal hysterectomy     No family history on file.  History  Substance Use Topics  . Smoking status: Never Smoker   . Smokeless tobacco: Not on file  . Alcohol Use: No    OB History    Grav Para Term Preterm Abortions TAB SAB Ect Mult Living                  Review of Systems  Constitutional: Negative for fever and diaphoresis.  HENT: Negative for sore throat, rhinorrhea and neck pain.   Respiratory: Positive for cough and shortness of breath. Negative for wheezing and stridor.   Cardiovascular: Negative for chest pain and orthopnea.  Gastrointestinal: Negative for nausea and vomiting.  All other systems reviewed and are negative.    Allergies  Codeine  Home Medications   Current Outpatient Rx  Name  Route  Sig  Dispense  Refill  . AMANTADINE HCL 100 MG PO CAPS   Oral   Take 100 mg by mouth daily.          Marland Kitchen AMLODIPINE BESYLATE 10 MG PO TABS   Oral   Take 1 tablet (10 mg total) by mouth daily.   30 tablet   0   . AMOXICILLIN 500 MG PO CAPS  Oral   Take 500 mg by mouth every 6 (six) hours.         . ASPIRIN 325 MG PO TABS   Oral   Take 325 mg by mouth daily.         Marland Kitchen CARBIDOPA-LEVODOPA 25-100 MG PO TABS   Oral   Take 0.5-1 tablets by mouth 2 (two) times daily. 1 tab in the morning and 0.5 tab at bedtime         . CLONAZEPAM 0.5 MG PO TABS   Oral   Take 0.5 mg by mouth at bedtime.         [place] COMPLETE PO LIQD   Oral   Take 237 mLs by mouth daily at 3 pm.         . FUROSEMIDE 40 MG PO TABS   Oral   Take 40 mg by mouth 3 (three) times a week. On Monday, Wednesday, and Friday.         Marland Kitchen MIRABEGRON ER 50 MG PO TB24   Oral   Take 50 mg by mouth daily.          . OCUVITE PO   Oral   Take 1 tablet by mouth every evening.           Marland Kitchen OMEPRAZOLE 20 MG PO CPDR   Oral   Take 20 mg by mouth daily.         Marland Kitchen PRAMIPEXOLE DIHYDROCHLORIDE ER 0.375 MG PO TB24   Oral   Take 1 tablet by mouth every morning.         Marland Kitchen TRAMADOL HCL 50 MG PO TABS   Oral   Take 50 mg by mouth daily as needed. For pain           Triage Vitals: BP 104/63  Pulse 52  Temp 97.3 F (36.3 C) (Oral)  Resp 22  Ht 4\' 8"  (1.422 m)  Wt 112 lb (50.803 kg)  BMI 25.11 kg/m2  SpO2 99%  Physical Exam  Nursing note and vitals reviewed. Constitutional: She is oriented to person, place, and time. She appears well-developed and well-nourished. No distress.  HENT:  Head: Normocephalic and atraumatic.  Mouth/Throat: Oropharynx is clear and moist. No oropharyngeal exudate.  Eyes: EOM are normal. Pupils are equal, round, and reactive to light.  Neck: Normal range of motion. Neck supple. No tracheal deviation present.  Cardiovascular: Normal rate, regular rhythm and normal heart sounds.   Pulmonary/Chest: Effort normal and breath sounds normal. No stridor. No respiratory distress. She has no wheezes. She has no rales.  Abdominal: Soft. Bowel sounds are normal. She exhibits no distension. There is no tenderness. There is no rebound and no guarding.  Musculoskeletal: Normal range of motion. She exhibits no edema.       DP 2+  Neurological: She is alert and oriented to person, place, and time.  Skin: Skin is warm and dry.  Psychiatric: She has a normal mood and affect. Her behavior is normal.    ED Course  Procedures (including critical care time) DIAGNOSTIC STUDIES: Oxygen Saturation is 99% on room air, normal by my interpretation.    COORDINATION OF CARE: 11:12 PM Discussed treatment plan which includes EKG, chest x-ray and bloodwork with pt at bedside and pt agreed to plan.   Orders Placed This Encounter  Procedures  . DG Chest 2 View    Standing Status: Standing     Number of Occurrences: 1     Standing Expiration Date:  Order  Specific Question:  Reason for exam:    Answer:  SHORTNESS OF BREATH  . CBC with Differential    Standing Status: Standing     Number of Occurrences: 1     Standing Expiration Date:   . Basic metabolic panel    Standing Status: Standing     Number of Occurrences: 1     Standing Expiration Date:   . Troponin I    Standing Status: Standing     Number of Occurrences: 1     Standing Expiration Date:   . Urinalysis, Routine w reflex microscopic    Standing Status: Standing     Number of Occurrences: 1     Standing Expiration Date:   . EKG 12-Lead    Standing Status: Standing     Number of Occurrences: 1     Standing Expiration Date:      Labs Reviewed  CBC WITH DIFFERENTIAL - Abnormal; Notable for the following:    RBC 3.77 (*)     Hemoglobin 10.6 (*)     HCT 32.3 (*)     All other components within normal limits  BASIC METABOLIC PANEL - Abnormal; Notable for the following:    Glucose, Bld 173 (*)     BUN 37 (*)     Creatinine, Ser 1.40 (*)     GFR calc non Af Amer 34 (*)     GFR calc Af Amer 39 (*)     All other components within normal limits  TROPONIN I  URINALYSIS, ROUTINE W REFLEX MICROSCOPIC   No results found.   No diagnosis found.    MDM  Pneumonia will treat.  Return for fevers, worsening breathing wheezing chest pain or any concerns.  Recheck with your regular doctor in 2 days.  Patient and husband verbalize understanding and agree to follow up. I personally performed the services described in this documentation, which was scribed in my presence. The recorded information has been reviewed and is accurate.   Date: 11/15/2012  Rate: 59  Rhythm: sinus bradycardia  QRS Axis: normal  Intervals: normal  ST/T Wave abnormalities: normal  Conduction Disutrbances:none  Narrative Interpretation:   Old EKG Reviewed: unchanged    In the setting of ongoing SOB, one unchanged EKG and one negative troponin is sufficient to exclude ACS.  Symptoms were not  consistent with cardiac etiology. Consistent with PNA      Ulys Favia K Albie Arizpe-Rasch, MD 11/15/12 0100  Karlin Binion K Marcelino Campos-Rasch, MD 11/15/12 [phone]

## 2012-11-14 NOTE — ED Notes (Addendum)
C/o feeling sob that started today. Also, c/o jaw pain that started 3 days ago. States this was eased with rest. Also, c/o chest pain a couple days ago. Denies any at present. Hx of MI. Denies any fevers. 02 Sat 99% RA at present. Family concerned that pt. Has awoken recently had what appears to be emesis on her bed sheets. Pt. Does not recall vomiting but states hx of reflux.

## 2012-11-15 LAB — BASIC METABOLIC PANEL
Calcium: 9.2 mg/dL (ref 8.4–10.5)
GFR calc Af Amer: 39 mL/min — ABNORMAL LOW (ref >90)
GFR calc non Af Amer: 34 mL/min — ABNORMAL LOW (ref >90)
Sodium: 141 mEq/L (ref 135–145)

## 2012-11-15 LAB — URINALYSIS, ROUTINE W REFLEX MICROSCOPIC
Ketones, ur: NEGATIVE mg/dL
Leukocytes, UA: NEGATIVE
Nitrite: NEGATIVE
Protein, ur: NEGATIVE mg/dL

## 2012-11-15 LAB — TROPONIN I: Troponin I: <0.3 ng/mL (ref <0.30)

## 2012-11-15 MED ORDER — MOXIFLOXACIN HCL 400 MG PO TABS: 400.0000 mg | ORAL_TABLET | Freq: Every day | ORAL | Status: DC

## 2012-11-15 MED ORDER — LEVOFLOXACIN 750 MG PO TABS
750.0000 mg | ORAL_TABLET | Freq: Once | ORAL | Status: AC
  Administered 2012-11-15: 750 mg via ORAL
  Filled 2012-11-15: qty 1

## 2012-11-15 NOTE — ED Notes (Signed)
Pt ambulated to BR gait steady with assist of one denies pain or SOB

## 2012-11-15 NOTE — ED Notes (Signed)
Labs drawn from IV saline lock site

## 2012-11-20 ENCOUNTER — Emergency Department (HOSPITAL_BASED_OUTPATIENT_CLINIC_OR_DEPARTMENT_OTHER): Payer: Medicare Other

## 2012-11-20 ENCOUNTER — Encounter (HOSPITAL_BASED_OUTPATIENT_CLINIC_OR_DEPARTMENT_OTHER): Payer: Self-pay | Admitting: Emergency Medicine

## 2012-11-20 ENCOUNTER — Inpatient Hospital Stay (HOSPITAL_BASED_OUTPATIENT_CLINIC_OR_DEPARTMENT_OTHER)
Admission: EM | Admit: 2012-11-20 | Discharge: 2012-11-23 | DRG: 179 | Disposition: A | Discharge status: Home / Self Care | Payer: Medicare Other | Attending: Internal Medicine | Admitting: Internal Medicine

## 2012-11-20 DIAGNOSIS — R001 Bradycardia, unspecified: Secondary | ICD-10-CM

## 2012-11-20 DIAGNOSIS — K2100 Gastro-esophageal reflux disease with esophagitis, without bleeding: Secondary | ICD-10-CM | POA: Diagnosis present

## 2012-11-20 DIAGNOSIS — E119 Type 2 diabetes mellitus without complications: Secondary | ICD-10-CM | POA: Diagnosis present

## 2012-11-20 DIAGNOSIS — K279 Peptic ulcer, site unspecified, unspecified as acute or chronic, without hemorrhage or perforation: Secondary | ICD-10-CM

## 2012-11-20 DIAGNOSIS — E876 Hypokalemia: Secondary | ICD-10-CM

## 2012-11-20 DIAGNOSIS — I129 Hypertensive chronic kidney disease with stage 1 through stage 4 chronic kidney disease, or unspecified chronic kidney disease: Secondary | ICD-10-CM | POA: Diagnosis present

## 2012-11-20 DIAGNOSIS — Z66 Do not resuscitate: Secondary | ICD-10-CM | POA: Diagnosis present

## 2012-11-20 DIAGNOSIS — I252 Old myocardial infarction: Secondary | ICD-10-CM

## 2012-11-20 DIAGNOSIS — J189 Pneumonia, unspecified organism: Secondary | ICD-10-CM | POA: Diagnosis present

## 2012-11-20 DIAGNOSIS — N183 Chronic kidney disease, stage 3 unspecified: Secondary | ICD-10-CM | POA: Diagnosis present

## 2012-11-20 DIAGNOSIS — R7989 Other specified abnormal findings of blood chemistry: Secondary | ICD-10-CM | POA: Diagnosis present

## 2012-11-20 DIAGNOSIS — Z8551 Personal history of malignant neoplasm of bladder: Secondary | ICD-10-CM

## 2012-11-20 DIAGNOSIS — E2740 Unspecified adrenocortical insufficiency: Secondary | ICD-10-CM

## 2012-11-20 DIAGNOSIS — I7300 Raynaud's syndrome without gangrene: Secondary | ICD-10-CM

## 2012-11-20 DIAGNOSIS — R0609 Other forms of dyspnea: Secondary | ICD-10-CM | POA: Diagnosis present

## 2012-11-20 DIAGNOSIS — K5900 Constipation, unspecified: Secondary | ICD-10-CM

## 2012-11-20 DIAGNOSIS — N189 Chronic kidney disease, unspecified: Secondary | ICD-10-CM

## 2012-11-20 DIAGNOSIS — E785 Hyperlipidemia, unspecified: Secondary | ICD-10-CM

## 2012-11-20 DIAGNOSIS — Z87891 Personal history of nicotine dependence: Secondary | ICD-10-CM

## 2012-11-20 DIAGNOSIS — J690 Pneumonitis due to inhalation of food and vomit: Principal | ICD-10-CM | POA: Diagnosis present

## 2012-11-20 DIAGNOSIS — G20A1 Parkinson's disease without dyskinesia, without mention of fluctuations: Secondary | ICD-10-CM | POA: Diagnosis present

## 2012-11-20 DIAGNOSIS — K449 Diaphragmatic hernia without obstruction or gangrene: Secondary | ICD-10-CM | POA: Diagnosis present

## 2012-11-20 DIAGNOSIS — G9340 Encephalopathy, unspecified: Secondary | ICD-10-CM

## 2012-11-20 DIAGNOSIS — N179 Acute kidney failure, unspecified: Secondary | ICD-10-CM

## 2012-11-20 DIAGNOSIS — G20 Parkinson's disease: Secondary | ICD-10-CM

## 2012-11-20 DIAGNOSIS — E860 Dehydration: Secondary | ICD-10-CM

## 2012-11-20 DIAGNOSIS — I2510 Atherosclerotic heart disease of native coronary artery without angina pectoris: Secondary | ICD-10-CM | POA: Diagnosis present

## 2012-11-20 DIAGNOSIS — M797 Fibromyalgia: Secondary | ICD-10-CM

## 2012-11-20 DIAGNOSIS — K2270 Barrett's esophagus without dysplasia: Secondary | ICD-10-CM

## 2012-11-20 DIAGNOSIS — R609 Edema, unspecified: Secondary | ICD-10-CM

## 2012-11-20 DIAGNOSIS — F419 Anxiety disorder, unspecified: Secondary | ICD-10-CM

## 2012-11-20 DIAGNOSIS — I10 Essential (primary) hypertension: Secondary | ICD-10-CM | POA: Diagnosis present

## 2012-11-20 DIAGNOSIS — R600 Localized edema: Secondary | ICD-10-CM

## 2012-11-20 DIAGNOSIS — W19XXXA Unspecified fall, initial encounter: Secondary | ICD-10-CM

## 2012-11-20 DIAGNOSIS — R1310 Dysphagia, unspecified: Secondary | ICD-10-CM | POA: Diagnosis present

## 2012-11-20 LAB — TROPONIN I: Troponin I: <0.3 ng/mL (ref <0.30)

## 2012-11-20 LAB — BASIC METABOLIC PANEL
BUN: 33 mg/dL — ABNORMAL HIGH (ref 6–23)
Chloride: 108 mEq/L (ref 96–112)
Creatinine, Ser: 1.4 mg/dL — ABNORMAL HIGH (ref 0.50–1.10)
GFR calc Af Amer: 39 mL/min — ABNORMAL LOW (ref >90)
GFR calc non Af Amer: 34 mL/min — ABNORMAL LOW (ref >90)

## 2012-11-20 LAB — POCT I-STAT 3, ART BLOOD GAS (G3+)
Acid-base deficit: 4 mmol/L — ABNORMAL HIGH (ref 0.0–2.0)
Bicarbonate: 20.6 mEq/L (ref 20.0–24.0)
Patient temperature: 98.6
TCO2: 22 mmol/L (ref 0–100)
pH, Arterial: 7.372 (ref 7.350–7.450)

## 2012-11-20 LAB — CBC
HCT: 30.3 % — ABNORMAL LOW (ref 36.0–46.0)
MCH: 27.9 pg (ref 26.0–34.0)
MCHC: 33 g/dL (ref 30.0–36.0)
RDW: 14.2 % (ref 11.5–15.5)

## 2012-11-20 LAB — MRSA PCR SCREENING: MRSA by PCR: POSITIVE — AB

## 2012-11-20 MED ORDER — IPRATROPIUM BROMIDE 0.02 % IN SOLN
0.5000 mg | Freq: Four times a day (QID) | RESPIRATORY_TRACT | Status: DC
  Administered 2012-11-21 (×2): 0.5 mg via RESPIRATORY_TRACT
  Filled 2012-11-20 (×3): qty 2.5

## 2012-11-20 MED ORDER — DEXTROSE 5 % IV SOLN
1.0000 g | INTRAVENOUS | Status: DC
  Administered 2012-11-20: 1 g via INTRAVENOUS
  Filled 2012-11-20: qty 10

## 2012-11-20 MED ORDER — SODIUM CHLORIDE 0.9 % IJ SOLN
3.0000 mL | Freq: Two times a day (BID) | INTRAMUSCULAR | Status: DC
  Administered 2012-11-21: 3 mL via INTRAVENOUS

## 2012-11-20 MED ORDER — MIRABEGRON ER 50 MG PO TB24
50.0000 mg | ORAL_TABLET | Freq: Every day | ORAL | Status: DC
  Administered 2012-11-21 – 2012-11-23 (×3): 50 mg via ORAL
  Filled 2012-11-20 (×3): qty 1

## 2012-11-20 MED ORDER — PIPERACILLIN-TAZOBACTAM 3.375 G IVPB
3.3750 g | Freq: Three times a day (TID) | INTRAVENOUS | Status: DC
  Administered 2012-11-21 – 2012-11-23 (×9): 3.375 g via INTRAVENOUS
  Filled 2012-11-20 (×14): qty 50

## 2012-11-20 MED ORDER — AMLODIPINE BESYLATE 10 MG PO TABS
10.0000 mg | ORAL_TABLET | Freq: Every day | ORAL | Status: DC
  Administered 2012-11-21 – 2012-11-23 (×3): 10 mg via ORAL
  Filled 2012-11-20 (×3): qty 1

## 2012-11-20 MED ORDER — ZOLPIDEM TARTRATE 5 MG PO TABS
5.0000 mg | ORAL_TABLET | Freq: Every day | ORAL | Status: DC
  Administered 2012-11-21: 5 mg via ORAL
  Filled 2012-11-20: qty 1

## 2012-11-20 MED ORDER — SODIUM CHLORIDE 0.9 % IJ SOLN: 3.0000 mL | INTRAMUSCULAR | Status: DC | PRN

## 2012-11-20 MED ORDER — ACETAMINOPHEN 325 MG PO TABS
650.0000 mg | ORAL_TABLET | ORAL | Status: DC | PRN
  Administered 2012-11-20: 650 mg via ORAL
  Filled 2012-11-20: qty 2

## 2012-11-20 MED ORDER — PANTOPRAZOLE SODIUM 40 MG PO TBEC
40.0000 mg | DELAYED_RELEASE_TABLET | Freq: Two times a day (BID) | ORAL | Status: DC
  Administered 2012-11-21 – 2012-11-23 (×6): 40 mg via ORAL
  Filled 2012-11-20 (×6): qty 1

## 2012-11-20 MED ORDER — CARBIDOPA-LEVODOPA 25-100 MG PO TABS
0.5000 | ORAL_TABLET | Freq: Every day | ORAL | Status: DC
  Administered 2012-11-21 – 2012-11-22 (×3): 0.5 via ORAL
  Filled 2012-11-20 (×5): qty 0.5

## 2012-11-20 MED ORDER — ENSURE COMPLETE PO LIQD
237.0000 mL | Freq: Every day | ORAL | Status: DC
  Administered 2012-11-21 – 2012-11-23 (×3): 237 mL via ORAL

## 2012-11-20 MED ORDER — INSULIN ASPART 100 UNIT/ML ~~LOC~~ SOLN: 0.0000 [IU] | Freq: Three times a day (TID) | SUBCUTANEOUS | Status: DC

## 2012-11-20 MED ORDER — SODIUM CHLORIDE 0.9 % IV SOLN: 250.0000 mL | INTRAVENOUS | Status: DC | PRN

## 2012-11-20 MED ORDER — INSULIN ASPART 100 UNIT/ML ~~LOC~~ SOLN: 0.0000 [IU] | Freq: Every day | SUBCUTANEOUS | Status: DC

## 2012-11-20 MED ORDER — ASPIRIN 325 MG PO TABS
325.0000 mg | ORAL_TABLET | Freq: Every day | ORAL | Status: DC
  Administered 2012-11-21: 325 mg via ORAL
  Filled 2012-11-20: qty 1

## 2012-11-20 MED ORDER — TEMAZEPAM 15 MG PO CAPS: 30.0000 mg | ORAL_CAPSULE | Freq: Every day | ORAL | Status: DC

## 2012-11-20 MED ORDER — AZITHROMYCIN 250 MG PO TABS
500.0000 mg | ORAL_TABLET | Freq: Once | ORAL | Status: AC
  Administered 2012-11-20: 500 mg via ORAL
  Filled 2012-11-20: qty 1

## 2012-11-20 MED ORDER — ENOXAPARIN SODIUM 40 MG/0.4ML ~~LOC~~ SOLN
40.0000 mg | SUBCUTANEOUS | Status: DC
  Administered 2012-11-21: 40 mg via SUBCUTANEOUS
  Filled 2012-11-20 (×2): qty 0.4

## 2012-11-20 MED ORDER — CARBIDOPA-LEVODOPA 25-100 MG PO TABS
1.0000 | ORAL_TABLET | Freq: Every day | ORAL | Status: DC
  Administered 2012-11-21 – 2012-11-23 (×3): 1 via ORAL
  Filled 2012-11-20 (×3): qty 1

## 2012-11-20 MED ORDER — ALBUTEROL SULFATE (5 MG/ML) 0.5% IN NEBU: 2.5000 mg | INHALATION_SOLUTION | RESPIRATORY_TRACT | Status: DC | PRN

## 2012-11-20 MED ORDER — GUAIFENESIN ER 600 MG PO TB12
600.0000 mg | ORAL_TABLET | Freq: Two times a day (BID) | ORAL | Status: DC
  Administered 2012-11-21 – 2012-11-23 (×6): 600 mg via ORAL
  Filled 2012-11-20 (×8): qty 1

## 2012-11-20 MED ORDER — SODIUM CHLORIDE 0.9 % IJ SOLN
3.0000 mL | Freq: Two times a day (BID) | INTRAMUSCULAR | Status: DC
  Administered 2012-11-21 – 2012-11-23 (×3): 3 mL via INTRAVENOUS

## 2012-11-20 MED ORDER — ONDANSETRON HCL 4 MG/2ML IJ SOLN: 4.0000 mg | Freq: Four times a day (QID) | INTRAMUSCULAR | Status: DC | PRN

## 2012-11-20 MED ORDER — FUROSEMIDE 40 MG PO TABS
40.0000 mg | ORAL_TABLET | Freq: Every day | ORAL | Status: DC
  Administered 2012-11-22 – 2012-11-23 (×2): 40 mg via ORAL
  Filled 2012-11-20 (×3): qty 1

## 2012-11-20 MED ORDER — TRAMADOL HCL 50 MG PO TABS
50.0000 mg | ORAL_TABLET | Freq: Every day | ORAL | Status: DC | PRN
  Administered 2012-11-20: 50 mg via ORAL
  Filled 2012-11-20: qty 1

## 2012-11-20 MED ORDER — CLONAZEPAM 0.5 MG PO TABS: 0.5000 mg | ORAL_TABLET | Freq: Every day | ORAL | Status: DC

## 2012-11-20 MED ORDER — AMANTADINE HCL 100 MG PO CAPS
100.0000 mg | ORAL_CAPSULE | Freq: Every day | ORAL | Status: DC
  Administered 2012-11-21 – 2012-11-23 (×3): 100 mg via ORAL
  Filled 2012-11-20 (×3): qty 1

## 2012-11-20 MED ORDER — FUROSEMIDE 10 MG/ML IJ SOLN
40.0000 mg | Freq: Once | INTRAMUSCULAR | Status: AC
  Administered 2012-11-20: 40 mg via INTRAVENOUS
  Filled 2012-11-20: qty 4

## 2012-11-20 MED ORDER — PRAMIPEXOLE DIHYDROCHLORIDE ER 0.375 MG PO TB24
1.0000 | ORAL_TABLET | Freq: Every morning | ORAL | Status: DC
  Administered 2012-11-21: 0.375 mg via ORAL
  Filled 2012-11-20: qty 1

## 2012-11-20 NOTE — ED Provider Notes (Signed)
History   Scribed for Celene Kras, MD, the patient was seen in room MH01/MH01 . This chart was scribed by Lewanda Rife.  CSN: [id number] Arrival date & time 11/20/12  1523 First MD Initiated Contact with Patient 11/20/12 1532      Chief Complaint  Patient presents with  . Shortness of Breath    Hx was provided by the husband and pt. HPI [name] is a 79 y.o. female who presents to the Emergency Department complaining of continuous moderate shortness of breath onset 12 days. Pt was here 6 days ago for the same and diagnosed with pneumonia. Husband states pt is still taking prescribed antibiotics at this time. Husband reports symptoms have not improved or gradually getting worse. Pt denies chest pain, fever, rhinorrhea, sore throat, stridor, wheezing, and urinary symptoms. Pt reports having emesis while sleeping, and a nonproductive cough.  Pt states shortness of breath is worse with exertion such as "changing her clothes". Husband additionally reports intermittent episodes of constipation and diarrhea for the last 12 days. Pt reports symptoms are improved by nothing.   Past Medical History  Diagnosis Date  . MI, old   . Parkinson disease   . Scoliosis   . Arthritis   . Bladder cancer   . HTN (hypertension) 04/21/2012  . Hyperlipidemia 04/21/2012  . DM (diabetes mellitus) 04/21/2012  . CAD (coronary artery disease) 04/21/2012  . Barrett esophagus 04/21/2012  . PUD (peptic ulcer disease) 04/21/2012  . Adrenal insufficiency 04/21/2012  . Raynaud's disease 04/21/2012  . Anxiety 04/21/2012  . Fibromyalgia 04/21/2012  . Hx of bladder cancer 04/21/2012  . Edema of lower extremity 04/21/2012    Chronic lower extremity edema  . Falls 04/21/2012    Past Surgical History  Procedure Date  . Tonsillectomy   . Appendectomy   . Abdominal hysterectomy     No family history on file.  History  Substance Use Topics  . Smoking status: Never Smoker   . Smokeless tobacco: Not on file  .  Alcohol Use: No    OB History    Grav Para Term Preterm Abortions TAB SAB Ect Mult Living                  Review of Systems  Constitutional: Negative.   HENT: Negative.   Respiratory: Positive for cough and shortness of breath.   Cardiovascular: Negative.  Negative for chest pain.  Gastrointestinal: Positive for vomiting, diarrhea and constipation.  Musculoskeletal: Negative.   Skin: Negative.   Neurological: Negative.   Hematological: Negative.   Psychiatric/Behavioral: Negative.   All other systems reviewed and are negative.    Allergies  Codeine  Home Medications   Current Outpatient Rx  Name  Route  Sig  Dispense  Refill  . AMANTADINE HCL 100 MG PO CAPS   Oral   Take 100 mg by mouth daily.          Marland Kitchen AMLODIPINE BESYLATE 10 MG PO TABS   Oral   Take 1 tablet (10 mg total) by mouth daily.   30 tablet   0   . AMOXICILLIN 500 MG PO CAPS   Oral   Take 500 mg by mouth every 6 (six) hours.         . ASPIRIN 325 MG PO TABS   Oral   Take 325 mg by mouth daily.         Marland Kitchen CARBIDOPA-LEVODOPA 25-100 MG PO TABS   Oral   Take 0.5-1 tablets  by mouth 2 (two) times daily. 1 tab in the morning and 0.5 tab at bedtime         . CLONAZEPAM 0.5 MG PO TABS   Oral   Take 0.5 mg by mouth at bedtime.         [place] COMPLETE PO LIQD   Oral   Take 237 mLs by mouth daily at 3 pm.         . FUROSEMIDE 40 MG PO TABS   Oral   Take 40 mg by mouth 3 (three) times a week. On Monday, Wednesday, and Friday.         Marland Kitchen MIRABEGRON ER 50 MG PO TB24   Oral   Take 50 mg by mouth daily.          Marland Kitchen MOXIFLOXACIN HCL 400 MG PO TABS   Oral   Take 1 tablet (400 mg total) by mouth daily.   7 tablet   0   . OCUVITE PO   Oral   Take 1 tablet by mouth every evening.         Marland Kitchen OMEPRAZOLE 20 MG PO CPDR   Oral   Take 20 mg by mouth daily.         Marland Kitchen PRAMIPEXOLE DIHYDROCHLORIDE ER 0.375 MG PO TB24   Oral   Take 1 tablet by mouth every morning.         Marland Kitchen  TRAMADOL HCL 50 MG PO TABS   Oral   Take 50 mg by mouth daily as needed. For pain           There were no vitals taken for this visit.  Physical Exam  Nursing note and vitals reviewed. Constitutional: No distress.       Elderly, frail   HENT:  Head: Normocephalic and atraumatic.  Right Ear: External ear normal.  Left Ear: External ear normal.  Eyes: Conjunctivae normal are normal. Right eye exhibits no discharge. Left eye exhibits no discharge. No scleral icterus.  Neck: Neck supple. No tracheal deviation present.  Cardiovascular: Normal rate, regular rhythm and intact distal pulses.   Pulmonary/Chest: Effort normal. No stridor. No respiratory distress. She has no wheezes. She has rales (few both bases).  Abdominal: Soft. Bowel sounds are normal. She exhibits no distension. There is no tenderness. There is no rebound and no guarding.  Musculoskeletal: She exhibits no edema and no tenderness.  Neurological: She is alert. No sensory deficit. Cranial nerve deficit:  no gross defecits noted. She exhibits normal muscle tone. She displays no seizure activity.       General weakness  Skin: Skin is warm and dry. No rash noted.  Psychiatric: She has a normal mood and affect.    ED Course  Procedures (including critical care time) EKG Rate, 65 Normal sinus rhythm Low voltage QRS Possible Anterolateral infarct , age undetermined Abnormal ECG No significant changes compared to last tracing  ED Medication Orders        Status Ordering Provider    11/20/12 1815    furosemide (LASIX) injection 40 mg Once       Route: Intravenous  Ordered Dose: 40 mg        Ordered Zuly Belkin R    11/20/12 1815    cefTRIAXone (ROCEPHIN) 1 g in dextrose 5 % 50 mL IVPB Every 24 hours    Discontinue    Route: Intravenous  Ordered Dose: 1 g        Ordered Deni Berti R  11/20/12 1815    azithromycin (ZITHROMAX) tablet 500 mg Once     Route: Oral  Ordered Dose: 500 mg        Labs Reviewed  PRO B  NATRIURETIC PEPTIDE - Abnormal; Notable for the following:    Pro B Natriuretic peptide (BNP) 2104.0 (*)     All other components within normal limits  BASIC METABOLIC PANEL - Abnormal; Notable for the following:    Glucose, Bld 126 (*)     BUN 33 (*)     Creatinine, Ser 1.40 (*)     GFR calc non Af Amer 34 (*)     GFR calc Af Amer 39 (*)     All other components within normal limits  CBC - Abnormal; Notable for the following:    RBC 3.58 (*)     Hemoglobin 10.0 (*)     HCT 30.3 (*)     All other components within normal limits  D-DIMER, QUANTITATIVE - Abnormal; Notable for the following:    D-Dimer, Quant 0.74 (*)     All other components within normal limits  POCT I-STAT 3, BLOOD GAS (G3+) - Abnormal; Notable for the following:    pO2, Arterial 70.0 (*)     Acid-base deficit 4.0 (*)     All other components within normal limits  TROPONIN I  BLOOD GAS, ARTERIAL   Dg Chest 2 View  11/20/2012  *RADIOLOGY REPORT*  Clinical Data: Short of breath  CHEST - 2 VIEW  Comparison: 11/14/2012  Findings: Large hiatal hernia with air-fluid level.  Cardiac enlargement without heart failure.  No effusion.  Right upper lobe infiltrate has progressed in the interval and could be pneumonia.  Right rib fracture is present which may be acute.  It is not definitely seen on the prior study. Thoracic kyphoscoliosis.  IMPRESSION: Right upper lobe infiltrate has developed and may represent pneumonia.  Right posterior rib fracture, question acute   Original Report Authenticated By: Janeece Riggers, M.D.      1. Community acquired pneumonia   2. Elevated brain natriuretic peptide (BNP) level       MDM  Shortness of breath The patient's x-ray shows worsening symptoms suggestive of pneumonia. She has orally been taking several days of Avelox. At this point, considering her persistent and possibly worsening symptoms I will admit her to the hospital for further treatment.  She may also have a component of  congestive heart failure considering her elevated BNP although the chest x-ray does not suggest heart failure.  Patient does have a slightly elevated d-dimer. Her creatinine is slightly higher than normal. May be more beneficial to proceed with V/Q scan as opposed to a CT  Of her chest any potential renal injury.      I personally performed the services described in this documentation, which was scribed in my presence. The recorded information has been reviewed and is accurate.    Celene Kras, MD 11/20/12 [phone]

## 2012-11-20 NOTE — ED Notes (Signed)
Spoke with Crystal at [facility] about hospitalist consult.

## 2012-11-20 NOTE — ED Notes (Signed)
Patient transported to X-ray 

## 2012-11-20 NOTE — ED Notes (Signed)
Pt having sob more than usual for 2-3 weeks.  No known fever.

## 2012-11-20 NOTE — ED Notes (Signed)
Patient returned from X-ray 

## 2012-11-20 NOTE — H&P (Signed)
PCP:    Allean Found, MD  Cardiology: Katrinka Blazing Urology: Gala Lewandowsky at [facility] Nephrology: Web  Chief Complaint:  cough  HPI: [name] is a 79 y.o. female   has a past medical history of MI, old; Parkinson disease; Scoliosis; Arthritis; Bladder cancer; HTN (hypertension) (04/21/2012); Hyperlipidemia (04/21/2012); DM (diabetes mellitus) (04/21/2012); CAD (coronary artery disease) (04/21/2012); Barrett esophagus (04/21/2012); PUD (peptic ulcer disease) (04/21/2012); Adrenal insufficiency (04/21/2012); Raynaud's disease (04/21/2012); Anxiety (04/21/2012); Fibromyalgia (04/21/2012); bladder cancer (04/21/2012); Edema of lower extremity (04/21/2012); and Falls (04/21/2012).   Presented with  Hx of dyspnea on exertion for the past 6 months that has worsened over time. For the past 3 week she has been panting. She fatigues more easily. Last week she had dry week and went to [place] and was diagnosed with PNA. She has had some issues with reflux and possible aspiration she sometimes wakes up with a yellow regurgitation type of substance on her pillow. Yesterday she was suddenly so short of breath she called 911 and then was evaluated in ER today and CXR showed persistent PNA. Denies any fever or chills.  Her d-dimer was noted to be slightly elevated but her cranial was too high to obtain CT of the chest to rule out PE she is hemodynamically stable but was transferred to [facility] cone step down for close observation overnight by the accepting physician.  Review of Systems:    Pertinent positives include: shortness of breath at rest.  dyspnea on exertion, non-productive cough  Constitutional:  No weight loss, night sweats, Fevers, chills, fatigue, weight loss  HEENT:  No headaches, Difficulty swallowing,Tooth/dental problems,Sore throat,  No sneezing, itching, ear ache, nasal congestion, post nasal drip,  Cardio-vascular:  No chest pain, Orthopnea, PND, anasarca, dizziness, palpitations.no Bilateral  lower extremity swelling  GI:  No heartburn, indigestion, abdominal pain, nausea, vomiting, diarrhea, change in bowel habits, loss of appetite, melena, blood in stool, hematemesis Resp:  No excess mucus, no productive cough, No , No coughing up of blood.No change in color of mucus.No wheezing. Skin:  no rash or lesions. No jaundice GU:  no dysuria, change in color of urine, no urgency or frequency. No straining to urinate.  No flank pain.  Musculoskeletal:  No joint pain or no joint swelling. No decreased range of motion. No back pain.  Psych:  No change in mood or affect. No depression or anxiety. No memory loss.  Neuro: no localizing neurological complaints, no tingling, no weakness, no double vision, no gait abnormality, no slurred speech, no confusion  Otherwise ROS are negative except for above, 10 systems were reviewed  Past Medical History: Past Medical History  Diagnosis Date  . MI, old   . Parkinson disease   . Scoliosis   . Arthritis   . Bladder cancer   . HTN (hypertension) 04/21/2012  . Hyperlipidemia 04/21/2012  . DM (diabetes mellitus) 04/21/2012  . CAD (coronary artery disease) 04/21/2012  . Barrett esophagus 04/21/2012  . PUD (peptic ulcer disease) 04/21/2012  . Adrenal insufficiency 04/21/2012  . Raynaud's disease 04/21/2012  . Anxiety 04/21/2012  . Fibromyalgia 04/21/2012  . Hx of bladder cancer 04/21/2012  . Edema of lower extremity 04/21/2012    Chronic lower extremity edema  . Falls 04/21/2012   Past Surgical History  Procedure Date  . Tonsillectomy   . Appendectomy   . Abdominal hysterectomy      Medications: Prior to Admission medications   Medication Sig Start Date End Date Taking? Authorizing Provider  amantadine (  SYMMETREL) 100 MG capsule Take 100 mg by mouth daily.     Historical Provider, MD  amLODipine (NORVASC) 10 MG tablet Take 1 tablet (10 mg total) by mouth daily. 05/24/12 05/24/13  Leroy Sea, MD  aspirin 325 MG tablet Take 325 mg by  mouth daily.    Historical Provider, MD  carbidopa-levodopa (SINEMET IR) 25-100 MG per tablet Take 0.5-1 tablets by mouth 2 (two) times daily. 1 tab in the morning and 0.5 tab at bedtime    Historical Provider, MD  clonazePAM (KLONOPIN) 0.5 MG tablet Take 0.5 mg by mouth at bedtime.    Historical Provider, MD  feeding supplement (ENSURE COMPLETE) LIQD Take 237 mLs by mouth daily at 3 pm.    Historical Provider, MD  furosemide (LASIX) 40 MG tablet Take 40 mg by mouth 3 (three) times a week. On Monday, Wednesday, and Friday.    Historical Provider, MD  mirabegron ER (MYRBETRIQ) 50 MG TB24 Take 50 mg by mouth daily.     Historical Provider, MD  moxifloxacin (AVELOX) 400 MG tablet Take 1 tablet (400 mg total) by mouth daily. 11/15/12   April K Palumbo-Rasch, MD  Multiple Vitamins-Minerals (OCUVITE PO) Take 1 tablet by mouth every evening.    Historical Provider, MD  omeprazole (PRILOSEC) 20 MG capsule Take 20 mg by mouth daily.    Historical Provider, MD  Pramipexole Dihydrochloride (MIRAPEX ER) 0.375 MG TB24 Take 1 tablet by mouth every morning.    Historical Provider, MD  traMADol (ULTRAM) 50 MG tablet Take 50 mg by mouth daily as needed. For pain    Historical Provider, MD    Allergies:   Allergies  Allergen Reactions  . Codeine Hives    Social History:  Ambulatory walker   Lives at  Home with family   reports that she has quit smoking. She does not have any smokeless tobacco history on file. She reports that she does not drink alcohol or use illicit drugs.   Family History: family history includes Bladder Cancer in her maternal aunt; Diabetes in her mother; and Heart disease in her mother.    Physical Exam: Patient Vitals for the past 24 hrs:  BP Temp Temp src Pulse Resp SpO2 Height Weight  11/20/12 2116 153/70 mmHg 97.6 F (36.4 C) Oral 65  16  97 % 4\' 8"  (1.422 m) 53.4 kg (117 lb 11.6 oz)  11/20/12 2100 153/70 mmHg 97.6 F (36.4 C) Oral 65  16  97 % - -  11/20/12 1922 127/52  mmHg - - 63  18  97 % - -  11/20/12 1551 129/61 mmHg 97.4 F (36.3 C) Oral - 21  97 % - -    1. General:  in No Acute distress 2. Psychological: Alert and   Oriented 3. Head/ENT:   Moist Mucous Membranes                          Head Non traumatic, neck supple                          Normal  Dentition 4. SKIN: decreased Skin turgor,  Skin clean Dry and intact no rash 5. Heart: Regular rate and rhythm no Murmur, Rub or gallop 6. Lungs: no wheezes occasional mild crackles   7. Abdomen: Soft, non-tender, Non distended 8. Lower extremities: no clubbing, cyanosis, left lower extremity appeared to be slightly larger on her right lower extremity  which patient had not noticed before. 9. Neurologically cranial nerves II through XII intact strength 5 out of 5 in all 4 extremities 10. MSK: Normal range of motion  body mass index is 26.39 kg/(m^2).   Labs on Admission:   [facility] 11/20/12 1548  NA 141  K 4.7  CL 108  CO2 21  GLUCOSE 126*  BUN 33*  CREATININE 1.40*  CALCIUM 9.3  MG --  PHOS --   No results found for this basename: AST:2,ALT:2,ALKPHOS:2,BILITOT:2,PROT:2,ALBUMIN:2 in the last 72 hours No results found for this basename: LIPASE:2,AMYLASE:2 in the last 72 hours  Basename 11/20/12 1548  WBC 5.8  NEUTROABS --  HGB 10.0*  HCT 30.3*  MCV 84.6  PLT 336    Basename 11/20/12 1548  CKTOTAL --  CKMB --  CKMBINDEX --  TROPONINI <0.30   No results found for this basename: TSH,T4TOTAL,FREET3,T3FREE,THYROIDAB in the last 72 hours No results found for this basename: VITAMINB12:2,FOLATE:2,FERRITIN:2,TIBC:2,IRON:2,RETICCTPCT:2 in the last 72 hours Lab Results  Component Value Date   HGBA1C 6.3* 04/21/2012    Estimated Creatinine Clearance: 21.1 ml/min (by C-G formula based on Cr of 1.4). ABG    Component Value Date/Time   PHART 7.372 11/20/2012 1626   HCO3 20.6 11/20/2012 1626   TCO2 22 11/20/2012 1626   ACIDBASEDEF 4.0* 11/20/2012 1626   O2SAT 94.0 11/20/2012 1626       Lab Results  Component Value Date   DDIMER 0.74* 11/20/2012     Other results:  I have pearsonaly reviewed this: ECG REPORT  Rate: 62  Rhythm: Normal sinus rhythm ST&T Change: Lead V1 has flipped T waves but otherwise no ischemic changes.  UA no evidence of UTI BNP 2104  Cultures:    Component Value Date/Time   SDES URINE, RANDOM 05/24/2012 0452   SPECREQUEST NONE 05/24/2012 0452   CULT INSIGNIFICANT GROWTH 05/24/2012 0452   REPTSTATUS 05/25/2012 FINAL 05/24/2012 0452       Radiological Exams on Admission: Dg Chest 2 View  11/20/2012  *RADIOLOGY REPORT*  Clinical Data: Short of breath  CHEST - 2 VIEW  Comparison: 11/14/2012  Findings: Large hiatal hernia with air-fluid level.  Cardiac enlargement without heart failure.  No effusion.  Right upper lobe infiltrate has progressed in the interval and could be pneumonia.  Right rib fracture is present which may be acute.  It is not definitely seen on the prior study. Thoracic kyphoscoliosis.  IMPRESSION: Right upper lobe infiltrate has developed and may represent pneumonia.  Right posterior rib fracture, question acute   Original Report Authenticated By: Janeece Riggers, M.D.     Chart has been reviewed  Assessment/Plan  Patient is an 79 year old female with history of coronary artery disease and hypertension and Parkinson disease here with worsening shortness of breath for past 3-6 months and possibly history of aspiration  Present on Admission:  Dyspnea  - this has been progressive over past 6 months differential includes pulmonary versus cardiac etiology. Her chest x-ray findings are somewhat atypical for community-acquired pneumonia will obtain noncontrasted CT scan to evaluate lung parenchyma better. Given elevated d-dimer swollen left leg and sudden onset of worsening shortness of breath we'll evaluate for PE but VQ scan in a.m. not a candidate for contrast a CT scan. Would get 2-D echo to evaluate for any evidence of heart failure.   Marland Kitchen HTN (hypertension) - continue home medications  . DM (diabetes mellitus) - sliding scale patient is usually diet controlled  . CAD (coronary artery disease) - no recent complaints  of chest pain cardiac enzymes have been negative x2  . Elevated brain natriuretic peptide (BNP) level - could be pulmonary in etiology versus CHF she does have history of peripheral edema for which she is on Lasix will obtain 2-D echo to evaluate this further  . Community acquired pneumonia - aspiration pneumonia could not be all done especially with history of waking up with sputum on the pillow and history of Parkinson disease as well as scoliosis and large hiatal hernia. We'll increase Protonix for now for twice a day and put her on Zosyn until aspiration but medical P0.we'll have speech pathology evaluate her.  CT scan of the chest to evaluate parenchyma.  . Parkinson's disease - we'll continue home medications Chronic kidney disease her creatinine within  within her baseline   Prophylaxis: Lovenox, Protonix  CODE STATUS: DNR /DNI as per patient's request  Other plan as per orders.  I have spent a total of 55 min on this admission  Berley Gambrell 11/20/2012, 10:41 PM

## 2012-11-21 ENCOUNTER — Inpatient Hospital Stay (HOSPITAL_COMMUNITY): Payer: Medicare Other

## 2012-11-21 DIAGNOSIS — K449 Diaphragmatic hernia without obstruction or gangrene: Secondary | ICD-10-CM | POA: Diagnosis present

## 2012-11-21 DIAGNOSIS — R0602 Shortness of breath: Secondary | ICD-10-CM

## 2012-11-21 DIAGNOSIS — R609 Edema, unspecified: Secondary | ICD-10-CM

## 2012-11-21 DIAGNOSIS — G20 Parkinson's disease: Secondary | ICD-10-CM

## 2012-11-21 DIAGNOSIS — R0989 Other specified symptoms and signs involving the circulatory and respiratory systems: Secondary | ICD-10-CM

## 2012-11-21 LAB — CREATININE, SERUM: GFR calc Af Amer: 37 mL/min — ABNORMAL LOW (ref >90)

## 2012-11-21 LAB — CBC
HCT: 30.4 % — ABNORMAL LOW (ref 36.0–46.0)
HCT: 31.7 % — ABNORMAL LOW (ref 36.0–46.0)
Hemoglobin: 10.4 g/dL — ABNORMAL LOW (ref 12.0–15.0)
MCH: 27.5 pg (ref 26.0–34.0)
MCHC: 32.8 g/dL (ref 30.0–36.0)
MCV: 82.8 fL (ref 78.0–100.0)
MCV: 83.2 fL (ref 78.0–100.0)
Platelets: 339 10*3/uL (ref 150–400)
RDW: 14.2 % (ref 11.5–15.5)
RDW: 14.3 % (ref 11.5–15.5)
WBC: 5.2 10*3/uL (ref 4.0–10.5)

## 2012-11-21 LAB — TSH: TSH: 2.918 u[IU]/mL (ref 0.350–4.500)

## 2012-11-21 LAB — GLUCOSE, CAPILLARY
Glucose-Capillary: 145 mg/dL — ABNORMAL HIGH (ref 70–99)
Glucose-Capillary: 81 mg/dL (ref 70–99)

## 2012-11-21 LAB — COMPREHENSIVE METABOLIC PANEL
ALT: <5 U/L (ref 0–35)
AST: 14 U/L (ref 0–37)
Albumin: 3.4 g/dL — ABNORMAL LOW (ref 3.5–5.2)
Alkaline Phosphatase: 69 U/L (ref 39–117)
Chloride: 106 mEq/L (ref 96–112)
Potassium: 4.2 mEq/L (ref 3.5–5.1)
Sodium: 140 mEq/L (ref 135–145)
Total Bilirubin: 0.3 mg/dL (ref 0.3–1.2)
Total Protein: 6.4 g/dL (ref 6.0–8.3)

## 2012-11-21 LAB — HEMOGLOBIN A1C: Mean Plasma Glucose: 143 mg/dL — ABNORMAL HIGH (ref <117)

## 2012-11-21 LAB — LEGIONELLA ANTIGEN, URINE: Legionella Antigen, Urine: NEGATIVE

## 2012-11-21 LAB — STREP PNEUMONIAE URINARY ANTIGEN: Strep Pneumo Urinary Antigen: NEGATIVE

## 2012-11-21 MED ORDER — ASPIRIN 325 MG PO TABS: 81.0000 mg | ORAL_TABLET | Freq: Every day | ORAL | Status: DC

## 2012-11-21 MED ORDER — MUPIROCIN 2 % EX OINT
1.0000 "application " | TOPICAL_OINTMENT | Freq: Two times a day (BID) | CUTANEOUS | Status: DC
  Administered 2012-11-21 – 2012-11-23 (×4): 1 via NASAL
  Filled 2012-11-21 (×2): qty 22

## 2012-11-21 MED ORDER — TECHNETIUM TO 99M ALBUMIN AGGREGATED
6.0000 | Freq: Once | INTRAVENOUS | Status: AC | PRN
  Administered 2012-11-21: 6 via INTRAVENOUS

## 2012-11-21 MED ORDER — TECHNETIUM TC 99M DIETHYLENETRIAME-PENTAACETIC ACID
41.5000 | Freq: Once | INTRAVENOUS | Status: AC | PRN
  Administered 2012-11-21: 41.5 via RESPIRATORY_TRACT

## 2012-11-21 MED ORDER — ENOXAPARIN SODIUM 30 MG/0.3ML ~~LOC~~ SOLN
30.0000 mg | Freq: Every day | SUBCUTANEOUS | Status: DC
  Administered 2012-11-21 – 2012-11-22 (×2): 30 mg via SUBCUTANEOUS
  Filled 2012-11-21 (×4): qty 0.3

## 2012-11-21 MED ORDER — FAMOTIDINE 20 MG PO TABS
20.0000 mg | ORAL_TABLET | Freq: Two times a day (BID) | ORAL | Status: DC
  Administered 2012-11-21 – 2012-11-23 (×5): 20 mg via ORAL
  Filled 2012-11-21 (×7): qty 1

## 2012-11-21 MED ORDER — ASPIRIN 81 MG PO CHEW
81.0000 mg | CHEWABLE_TABLET | Freq: Every day | ORAL | Status: DC
  Administered 2012-11-22 – 2012-11-23 (×2): 81 mg via ORAL
  Filled 2012-11-21 (×2): qty 1

## 2012-11-21 MED ORDER — TRAMADOL-ACETAMINOPHEN 37.5-325 MG PO TABS
2.0000 | ORAL_TABLET | Freq: Four times a day (QID) | ORAL | Status: DC | PRN
  Filled 2012-11-21: qty 2

## 2012-11-21 MED ORDER — CHLORHEXIDINE GLUCONATE CLOTH 2 % EX PADS
6.0000 | MEDICATED_PAD | Freq: Every day | CUTANEOUS | Status: DC
  Administered 2012-11-21 – 2012-11-23 (×2): 6 via TOPICAL

## 2012-11-21 MED ORDER — SELEGILINE HCL 5 MG PO TABS
5.0000 mg | ORAL_TABLET | Freq: Every day | ORAL | Status: DC
  Administered 2012-11-21 – 2012-11-22 (×2): 5 mg via ORAL
  Filled 2012-11-21 (×4): qty 1

## 2012-11-21 MED ORDER — IPRATROPIUM BROMIDE 0.02 % IN SOLN
0.5000 mg | Freq: Three times a day (TID) | RESPIRATORY_TRACT | Status: DC
  Administered 2012-11-22 – 2012-11-23 (×3): 0.5 mg via RESPIRATORY_TRACT
  Filled 2012-11-21 (×3): qty 2.5

## 2012-11-21 MED ORDER — TEMAZEPAM 15 MG PO CAPS
30.0000 mg | ORAL_CAPSULE | Freq: Every evening | ORAL | Status: DC | PRN
  Administered 2012-11-21 – 2012-11-22 (×2): 30 mg via ORAL
  Filled 2012-11-21: qty 2
  Filled 2012-11-21 (×2): qty 1

## 2012-11-21 MED ORDER — PRAMIPEXOLE DIHYDROCHLORIDE ER 0.375 MG PO TB24
1.0000 | ORAL_TABLET | Freq: Every day | ORAL | Status: DC
  Filled 2012-11-21 (×3): qty 1

## 2012-11-21 NOTE — Progress Notes (Addendum)
[facility] PROGRESS NOTE  PRERNA HAROLD ZOX:[id number] DOB: 02-Sep-1928 DOA: 11/20/2012 PCP: Allean Found, MD  Brief narrative: Hx of dyspnea on exertion for the past 6 months that has worsened over time. For the past 3 week she has been panting. She fatigues more easily. Last week she had dry week and went to [place] and was diagnosed with PNA. She has had some issues with reflux and possible aspiration she sometimes wakes up with a yellow regurgitation type of substance on her pillow. Yesterday she was suddenly so short of breath she called 911 and then was evaluated in ER today and CXR showed persistent PNA. Denies any fever or chills.  Her d-dimer was noted to be slightly elevated but her cranial was too high to obtain CT of the chest to rule out PE she is hemodynamically stable but was transferred to [facility] cone step down for close observation overnight by the accepting physician.   Assessment/Plan: Principal Problem:  *Community acquired pneumonia - stable, not requiring O2-  - VQ negative/ dopplers of legs negative - Mostly right sided- high suspicion that this is aspiration possible related to reflux or esophageal dysmotility - f/u sputum cx- cont zosyn - Have ordered an MBS  Active Problems:   Hiatal hernia - noted on CT chest Suspected Reflux -Suspect this is resulting in reflux and GERD- have advised small meal, not eating snacks before bedtime (which she tends to do) and increasing prilosec from 20 mg daily to BID - Also advised to cut ASA back to 81 mg and stop Motrin- started Ultracet instead - have ordered and esophagram to assess for esophageal dysmotility- pt does not yet want a GI consult and agrees to trying above measures first.  NOTE- pt has Barrets esophagus and PUD mentioned in problem list but tells me her last GI eval was about 10-15 yrs ago.    Dyspnea on exertion -F/u on ECHO - takes lasix daily at home   HTN (hypertension) -Cont  norvasc   DM (diabetes mellitus) listed in records -Pt states she does not have this-  - A1c noted to be 6.6   Parkinson's disease -Cont current meds- was following with dr Sandria Manly   CAD (coronary artery disease) with 2 stents -Change to baby ASA- Would like to let Dr Katrinka Blazing know prior to d/c (today is a weekend and he is off)  CKD 3  Anemia-  chronic  Code Status: DNR Family Communication: with husband- also reviewed all medications and modified per list and report by husband- Disposition Plan: transfer to med/surg DVT prophylaxis: lovenox  Antibiotics:  zithromax 1/18 only  Zosyn 1/18>>>  HPI/Subjective: Pt admits to dry cough usually after she lays flat for bed, no obvious reflux. She admits to some food getting stuck in esophagus and being painful to swallow it- it does not occur with every bite but intermittently during some meals. Also has dyspepsia.   Objective: Filed Vitals:   11/21/12 0300 11/21/12 0743 11/21/12 1100 11/21/12 1510  BP: 162/70 122/65 138/69 117/68  Pulse: 64 60 64 59  Temp: 97.3 F (36.3 C) 98 F (36.7 C) 97.9 F (36.6 C) 98 F (36.7 C)  TempSrc: Oral Oral Oral Oral  Resp: 22 17 18 26   Height:      Weight:      SpO2: 97% 98% 100% 97%    Intake/Output Summary (Last 24 hours) at 11/21/12 1754 Last data filed at 11/21/12 1620  Gross per 24 hour  Intake  480 ml  Output   1901 ml  Net  -1421 ml    Exam:   General:  Alert, oreinted x 3, no acute distress  Cardiovascular: RRR, no murmurs  Respiratory: mildly coarse breath sounds in Right lung field- no crackles, or wheeze  Abdomen: soft, NT, ND, BS+  Ext: no c/c/e  Data Reviewed: Basic Metabolic Panel:  Lab 11/21/12 1610 11/21/12 0048 11/20/12 1548 11/14/12 2345  NA 140 -- 141 141  K 4.2 -- 4.7 4.1  CL 106 -- 108 106  CO2 22 -- 21 23  GLUCOSE 84 -- 126* 173*  BUN 30* -- 33* 37*  CREATININE 1.37* 1.46* 1.40* 1.40*  CALCIUM 8.9 -- 9.3 9.2  MG 2.1 -- -- --  PHOS 3.6 -- --  --   Liver Function Tests:  Lab 11/21/12 0440  AST 14  ALT <5  ALKPHOS 69  BILITOT 0.3  PROT 6.4  ALBUMIN 3.4*   No results found for this basename: LIPASE:5,AMYLASE:5 in the last 168 hours No results found for this basename: AMMONIA:5 in the last 168 hours CBC:  Lab 11/21/12 0440 11/21/12 0048 11/20/12 1548 11/14/12 2345  WBC 6.4 5.2 5.8 8.4  NEUTROABS -- -- -- 6.2  HGB 10.4* 10.1* 10.0* 10.6*  HCT 31.7* 30.4* 30.3* 32.3*  MCV 83.2 82.8 84.6 85.7  PLT 350 339 336 303   Cardiac Enzymes:  Lab 11/20/12 1548 11/14/12 2345  CKTOTAL -- --  CKMB -- --  CKMBINDEX -- --  TROPONINI <0.30 <0.30   BNP (last 3 results)  Basename 11/20/12 1548  PROBNP 2104.0*   CBG:  Lab 11/21/12 1104 11/21/12 0056  GLUCAP 81 145*    Recent Results (from the past 240 hour(s))  MRSA PCR SCREENING     Status: Abnormal   Collection Time   11/20/12  9:25 PM      Component Value Range Status Comment   MRSA by PCR POSITIVE (*) NEGATIVE Final      Studies: Dg Chest 2 View  11/20/2012  *RADIOLOGY REPORT*  Clinical Data: Short of breath  CHEST - 2 VIEW  Comparison: 11/14/2012  Findings: Large hiatal hernia with air-fluid level.  Cardiac enlargement without heart failure.  No effusion.  Right upper lobe infiltrate has progressed in the interval and could be pneumonia.  Right rib fracture is present which may be acute.  It is not definitely seen on the prior study. Thoracic kyphoscoliosis.  IMPRESSION: Right upper lobe infiltrate has developed and may represent pneumonia.  Right posterior rib fracture, question acute   Original Report Authenticated By: Janeece Riggers, M.D.    Dg Chest 2 View  11/14/2012  *RADIOLOGY REPORT*  Clinical Data: Cough, shortness of breath, chest pain.  CHEST - 2 VIEW  Comparison: 05/23/2012  Findings: The patient is rotated to the right.  Mild cardiomegaly stable.  Tortuous thoracic aorta.  New airspace infiltrate in the right lower lobe.  No effusion.  IMPRESSION:  1.  Right  lower lobe patchy airspace infiltrate suggesting pneumonia.   Original Report Authenticated By: D. Andria Rhein, MD    Ct Chest Wo Contrast  11/21/2012  *RADIOLOGY REPORT*  Clinical Data: Exertional dyspnea for 6 months.  Recently diagnosed with pneumonia.  History bladder cancer.  Hypertension.  Coronary artery disease.  Diabetes.  CT CHEST WITHOUT CONTRAST  Technique:  Multidetector CT imaging of the chest was performed following the standard protocol without IV contrast.  Comparison: 11/20/2012 plain film.  Most recent CT of 05/28/2011  Findings:  Lungs/pleura: Mild degradation secondary patient arm position and minimal motion. Calcified granuloma or focus of left major fissural thickening on image 22/series 3.  There is also a calcified granuloma at the left lung base.  Patchy airspace disease throughout the right lung. Most prevalent in the right upper lobe. No pneumothorax.  Small bilateral pleural effusions which are new.  Heart/Mediastinum: Tortuous thoracic aorta, with atherosclerosis within.  Moderate to marked cardiomegaly with dense coronary artery atherosclerosis.  No pericardial effusion.  Paucity of mediastinal fat, without well-defined mediastinal adenopathy. Hilar regions poorly evaluated without intravenous contrast.  A moderate hiatal hernia.  Upper abdomen: Degradation continuing in the upper abdomen. Borderline gallbladder distention, without calcified stone or specific evidence of acute cholecystitis.  Vascular calcification medial the right kidney. Colonic stool burden suggests constipation.  Bones/Musculoskeletal:  Nonacute, but interval fractures of the 8th through eleventh posterolateral right ribs.  The eighth and ninth rib fractures are segmental. Accentuation of expected thoracic kyphosis.  Spinal curvature which is convex right centered about the thoracolumbar junction.  IMPRESSION:  1.  Multifocal right-sided airspace disease, most consistent with infection. 2.  Decreased sensitivity  and specificity exam due to technique related factors, as described above. 3.  Nonacute but interval since 05/28/2011 right-sided rib fractures. 4.  Small bilateral pleural effusions. 5.  Moderate hiatal hernia.  6.  Advanced coronary artery disease.   Original Report Authenticated By: Jeronimo Greaves, M.D.    Nm Pulmonary Perf And Vent  11/21/2012  *RADIOLOGY REPORT*  Clinical Data:  Shortness of breath.  Rule out pulmonary embolism.  NUCLEAR MEDICINE VENTILATION - PERFUSION LUNG SCAN  Technique:  Ventilation images were obtained in multiple projections using inhaled aerosol technetium 99 M DTPA.  Perfusion images were obtained in multiple projections after intravenous injection of Tc-67m MAA.  Radiopharmaceuticals:  41. Tc-74m DTPA aerosol and 5.1 mCi Tc- 6m MAA.  Comparison: Plain film 11/20/2012  Findings:  Ventilation:  Ventilation images demonstrate right hemidiaphragm elevation.  Perfusion:  Perfusion images demonstrate a subtledefect within the inferior lateral left hemithorax, most apparent on the LPO view. This is matched by a ventilation defect.  IMPRESSION: Low probability for pulmonary embolism.  Single matched perfusion and ventilation defect in the posterior lateral aspect of the inferior left hemithorax.   Original Report Authenticated By: Jeronimo Greaves, M.D.     Scheduled Meds:    . amantadine  100 mg Oral Daily  . amLODipine  10 mg Oral Daily  . aspirin  81 mg Oral Daily  . carbidopa-levodopa  0.5 tablet Oral QHS  . carbidopa-levodopa  1 tablet Oral Daily  . Chlorhexidine Gluconate Cloth  6 each Topical Q0600  . enoxaparin (LOVENOX) injection  30 mg Subcutaneous QHS  . famotidine  20 mg Oral BID  . feeding supplement  237 mL Oral Q1500  . furosemide  40 mg Oral Daily  . guaiFENesin  600 mg Oral BID  . ipratropium  0.5 mg Nebulization Q6H  . mirabegron ER  50 mg Oral Daily  . mupirocin ointment  1 application Nasal BID  . pantoprazole  40 mg Oral BID  . piperacillin-tazobactam  (ZOSYN)  IV  3.375 g Intravenous Q8H  . Pramipexole Dihydrochloride  1 tablet Oral QHS  . selegiline  5 mg Oral QHS  . sodium chloride  3 mL Intravenous Q12H  . sodium chloride  3 mL Intravenous Q12H   Continuous Infusions:   ________________________________________________________________________  Time spent: 45 min    [facility]  [facility] Pager [phone] If  8PM-8AM, please contact night-coverage at [email or web address], password [facility] 11/21/2012, 5:54 PM  LOS: 1 day

## 2012-11-21 NOTE — Progress Notes (Signed)
VASCULAR LAB PRELIMINARY  PRELIMINARY  PRELIMINARY  PRELIMINARY  Bilateral lower extremity venous Dopplers completed.    Preliminary report:  There is no DVT or SVT noted in the bilateral lower extremities.  Chaneka Trefz, RVT 11/21/2012, 10:19 AM

## 2012-11-21 NOTE — Progress Notes (Signed)
Pt txing to 5500 per MD order, pt VSS, pt and family verbalized understanding of tx, family at [facility], report called all questions answered

## 2012-11-22 ENCOUNTER — Inpatient Hospital Stay (HOSPITAL_COMMUNITY): Payer: Medicare Other

## 2012-11-22 DIAGNOSIS — I10 Essential (primary) hypertension: Secondary | ICD-10-CM

## 2012-11-22 DIAGNOSIS — I2510 Atherosclerotic heart disease of native coronary artery without angina pectoris: Secondary | ICD-10-CM

## 2012-11-22 LAB — GLUCOSE, CAPILLARY
Glucose-Capillary: 67 mg/dL — ABNORMAL LOW (ref 70–99)
Glucose-Capillary: 79 mg/dL (ref 70–99)
Glucose-Capillary: 92 mg/dL (ref 70–99)
Glucose-Capillary: 143 mg/dL — ABNORMAL HIGH (ref 70–99)

## 2012-11-22 MED ORDER — PRAMIPEXOLE DIHYDROCHLORIDE 0.25 MG PO TABS
0.2500 mg | ORAL_TABLET | Freq: Every day | ORAL | Status: DC
  Administered 2012-11-22: 0.25 mg via ORAL
  Filled 2012-11-22 (×3): qty 1

## 2012-11-22 MED ORDER — PRAMIPEXOLE DIHYDROCHLORIDE 0.125 MG PO TABS
0.1250 mg | ORAL_TABLET | Freq: Every day | ORAL | Status: DC
  Administered 2012-11-22: 0.125 mg via ORAL
  Filled 2012-11-22 (×3): qty 1

## 2012-11-22 NOTE — Consult Note (Signed)
Subjective:   HPI  The patient is an 79 year old female who we are asked to see in consultation in regards to dysphagia and gastroesophageal reflux. She has a history of erosive esophagitis and a moderate hiatal hernia. She had an EGD in January 2011 by Dr. Bosie Clos and biopsies of the distal esophagus were consistent with erosive esophagitis, and there was no evidence of Barrett's esophagus. She was having dysphagia back then and in reviewing an office note from Dr. Bosie Clos he felt that her dysphagia may be multifactorial not only from reflux but also esophageal dysmotility. She does complain of heartburn. She complains that occasionally when she swallows she feels that food will stick in the lower esophagus and then after a while and eventually pass. Sometime she will regurgitated up.  Review of Systems Cough, dyspnea,  Past Medical History  Diagnosis Date  . MI, old   . Parkinson disease   . Scoliosis   . Arthritis   . Bladder cancer   . HTN (hypertension) 04/21/2012  . Hyperlipidemia 04/21/2012  . DM (diabetes mellitus) 04/21/2012  . CAD (coronary artery disease) 04/21/2012  . Barrett esophagus 04/21/2012  . PUD (peptic ulcer disease) 04/21/2012  . Adrenal insufficiency 04/21/2012  . Raynaud's disease 04/21/2012  . Anxiety 04/21/2012  . Fibromyalgia 04/21/2012  . Hx of bladder cancer 04/21/2012  . Edema of lower extremity 04/21/2012    Chronic lower extremity edema  . Falls 04/21/2012   Past Surgical History  Procedure Date  . Tonsillectomy   . Appendectomy   . Abdominal hysterectomy    History   Social History  . Marital Status: Married    Spouse Name: N/A    Number of Children: N/A  . Years of Education: N/A   Occupational History  . Not on file.   Social History Main Topics  . Smoking status: Former Games developer  . Smokeless tobacco: Not on file  . Alcohol Use: No  . Drug Use: No  . Sexually Active: No   Other Topics Concern  . Not on file   Social History Narrative  .  No narrative on file   family history includes Bladder Cancer in her maternal aunt; Diabetes in her mother; and Heart disease in her mother. Current facility-administered medications:0.9 %  sodium chloride infusion, 250 mL, Intravenous, PRN, Therisa Doyne, MD;  acetaminophen (TYLENOL) tablet 650 mg, 650 mg, Oral, Q4H PRN, Therisa Doyne, MD, 650 mg at 11/20/12 2338;  albuterol (PROVENTIL) (5 MG/ML) 0.5% nebulizer solution 2.5 mg, 2.5 mg, Nebulization, Q4H PRN, Therisa Doyne, MD amantadine (SYMMETREL) capsule 100 mg, 100 mg, Oral, Daily, Therisa Doyne, MD, 100 mg at 11/22/12 0917;  amLODipine (NORVASC) tablet 10 mg, 10 mg, Oral, Daily, Therisa Doyne, MD, 10 mg at 11/22/12 4098;  aspirin chewable tablet 81 mg, 81 mg, Oral, Daily, Calvert Cantor, MD, 81 mg at 11/22/12 0917 carbidopa-levodopa (SINEMET IR) 25-100 MG per tablet immediate release 0.5 tablet, 0.5 tablet, Oral, QHS, Laveda Norman, MD, 0.5 tablet at 11/21/12 2137;  carbidopa-levodopa (SINEMET IR) 25-100 MG per tablet immediate release 1 tablet, 1 tablet, Oral, Daily, Therisa Doyne, MD, 1 tablet at 11/22/12 0917;  Chlorhexidine Gluconate Cloth 2 % PADS 6 each, 6 each, Topical, Q0600, Calvert Cantor, MD, 6 each at 11/21/12 1600 enoxaparin (LOVENOX) injection 30 mg, 30 mg, Subcutaneous, QHS, Therisa Doyne, MD, 30 mg at 11/21/12 2135;  famotidine (PEPCID) tablet 20 mg, 20 mg, Oral, BID, Calvert Cantor, MD, 20 mg at 11/22/12 0917;  feeding supplement (ENSURE COMPLETE) liquid 237  mL, 237 mL, Oral, Q1500, Therisa Doyne, MD, 237 mL at 11/21/12 1500;  furosemide (LASIX) tablet 40 mg, 40 mg, Oral, Daily, Therisa Doyne, MD, 40 mg at 11/22/12 0917 guaiFENesin (MUCINEX) 12 hr tablet 600 mg, 600 mg, Oral, BID, Therisa Doyne, MD, 600 mg at 11/22/12 0917;  ipratropium (ATROVENT) nebulizer solution 0.5 mg, 0.5 mg, Nebulization, TID, Maretta Bees, MD, 0.5 mg at 11/22/12 0736;  mirabegron ER (MYRBETRIQ) tablet 50 mg, 50 mg,  Oral, Daily, Therisa Doyne, MD, 50 mg at 11/22/12 0917 mupirocin ointment (BACTROBAN) 2 % 1 application, 1 application, Nasal, BID, Calvert Cantor, MD, 1 application at 11/22/12 0917;  ondansetron (ZOFRAN) injection 4 mg, 4 mg, Intravenous, Q6H PRN, Therisa Doyne, MD;  pantoprazole (PROTONIX) EC tablet 40 mg, 40 mg, Oral, BID, Therisa Doyne, MD, 40 mg at 11/22/12 0917;  piperacillin-tazobactam (ZOSYN) IVPB 3.375 g, 3.375 g, Intravenous, Q8H, Therisa Doyne, MD, 3.375 g at 11/22/12 0651 Pramipexole Dihydrochloride TB24 0.375 mg, 1 tablet, Oral, QHS, Saima Rizwan, MD;  selegiline (ELDEPRYL) tablet 5 mg, 5 mg, Oral, QHS, Saima Rizwan, MD, 5 mg at 11/21/12 2135;  sodium chloride 0.9 % injection 3 mL, 3 mL, Intravenous, Q12H, Therisa Doyne, MD, 3 mL at 11/22/12 0917;  sodium chloride 0.9 % injection 3 mL, 3 mL, Intravenous, PRN, Therisa Doyne, MD temazepam (RESTORIL) capsule 30 mg, 30 mg, Oral, QHS PRN, Calvert Cantor, MD, 30 mg at 11/21/12 2134;  traMADol-acetaminophen (ULTRACET) 37.5-325 MG per tablet 2 tablet, 2 tablet, Oral, Q6H PRN, Calvert Cantor, MD Allergies  Allergen Reactions  . Codeine Hives     Objective:     BP 136/66  Pulse 64  Temp 98.3 F (36.8 C) (Oral)  Resp 21  Ht 4\' 8"  (1.422 m)  Wt 56.1 kg (123 lb 10.9 oz)  BMI 27.73 kg/m2  SpO2 94%  No distress  Heart regular rhythm  Lungs with coarse breath sounds  Abdomen is soft and nontender without obvious masses or hepatomegaly    Laboratory No components found with this basename: d1      Assessment:     Dysphagia, this may be multifactorial and related to a combination of factors such as erosive distal esophagitis, moderate sized hiatal hernia, and esophageal dysmotility.  History of erosive esophagitis and gastroesophageal reflux  Moderate size hiatal hernia  Parkinson's disease      Plan:     Obtain barium swallow to evaluate the esophagus. Further decisions to be made after reviewing  the barium swallow. Continue PPI therapy.

## 2012-11-22 NOTE — Progress Notes (Signed)
  Echocardiogram 2D Echocardiogram has been performed.  Danton Palmateer [name] 11/22/2012, 12:56 PM

## 2012-11-22 NOTE — Evaluation (Signed)
Clinical/Bedside Swallow Evaluation Patient Details  Name: [name] MRN: [id number] Date of Birth: 1928/09/20  Today's Date: 11/22/2012 Time: 4098-1191 SLP Time Calculation (min): 31 min  Past Medical History:  Past Medical History  Diagnosis Date  . MI, old   . Parkinson disease   . Scoliosis   . Arthritis   . Bladder cancer   . HTN (hypertension) 04/21/2012  . Hyperlipidemia 04/21/2012  . DM (diabetes mellitus) 04/21/2012  . CAD (coronary artery disease) 04/21/2012  . Barrett esophagus 04/21/2012  . PUD (peptic ulcer disease) 04/21/2012  . Adrenal insufficiency 04/21/2012  . Raynaud's disease 04/21/2012  . Anxiety 04/21/2012  . Fibromyalgia 04/21/2012  . Hx of bladder cancer 04/21/2012  . Edema of lower extremity 04/21/2012    Chronic lower extremity edema  . Falls 04/21/2012   Past Surgical History:  Past Surgical History  Procedure Date  . Tonsillectomy   . Appendectomy   . Abdominal hysterectomy    HPI:  79 yr old admitted with cough.  Diagnosed with pna at [facility] last week.  PMH:  Parkinson's, large hiatal hernia, Barretts esophagus, GERD, fibromyalgia, MI, DM, CAP.  Pt. reports coughing after meals and at night discovering emesis on bed/pillow without waking up during episode.  She and spouse deny coughin/choking during meals.  Pt. reports it has "been a long time since last pna."   Assessment / Plan / Recommendation Clinical Impression  Pt. appears to be exhibiting symptoms of esophageal impairment such as globus sensation during bedside assessment.  No s/s aspiration observed.  Suspect pna is likely due to a primary esophageal dysphagia given clinical observation, known esophageal deficits and report of frequent coughing at night with emesis.  Discussed in detail reflux precaution to decrease symptoms.  Recommend pt. continue regular diet texture and thin liquids and pt. would benefit from GI conuslt to assess esophageal structure.     Aspiration Risk  Moderate    Diet Recommendation Regular;Thin liquid   Liquid Administration via: Cup;No straw Medication Administration: Whole meds with liquid Supervision: Patient able to self feed Compensations: Follow solids with liquid;Small sips/bites;Slow rate Postural Changes and/or Swallow Maneuvers: Out of bed for meals;Seated upright 90 degrees;Upright 30-60 min after meal    Other  Recommendations Recommended Consults: Consider GI evaluation Oral Care Recommendations: Oral care BID   Follow Up Recommendations  None                   Swallow Study         Oral/Motor/Sensory Function Overall Oral Motor/Sensory Function: Appears within functional limits for tasks assessed   Ice Chips Ice chips: Not tested   Thin Liquid Thin Liquid: Within functional limits Presentation: Cup    Nectar Thick Nectar Thick Liquid: Not tested   Honey Thick Honey Thick Liquid: Not tested   Puree Puree: Within functional limits   Solid       Solid: Within functional limits       Royce Macadamia M.Ed [employer] [phone]  11/22/2012

## 2012-11-22 NOTE — Progress Notes (Signed)
PATIENT DETAILS Name: [name] Age: 79 y.o. Sex: female Date of Birth: [birth year] Admit Date: 11/20/2012 Admitting Physician Therisa Doyne, MD [id number] Venetia Constable, MD  Subjective: Denies major complaints  Assessment/Plan: PNA CAP vs Aspiration Afebrile, not toxic looking, no leukocytosis C/w Zosyn  Dysphagia -await Barium esophagogram -appreciate GI eval  SOB -likley 2/2 aspiration PNA -VQ low probability for PE, Doppler neg for DVT -await Echo  GERD/Hiatal Hernia -c/w PPI and Pepcid  HTN (hypertension)  -Cont norvasc-BP controlled  Parkinson's disease -c/w Sinemet, Pramipexole and Amantadine -stable at present  CAD -c/w ASA  Chronic lower extremity edema - Continue Lasix  Disposition: Remain inpatient  DVT Prophylaxis: Prophylactic Lovenox   Code Status: Full code  Procedures:  None  CONSULTS:  GI  PHYSICAL EXAM: Vital signs in last 24 hours: Filed Vitals:   11/21/12 2011 11/21/12 2038 11/22/12 0446 11/22/12 0917  BP:  115/66 133/68 136/66  Pulse: 62 64 64   Temp:  98.3 F (36.8 C) 98.3 F (36.8 C)   TempSrc:  Oral Oral   Resp: 22 20 21    Height:      Weight:   56.1 kg (123 lb 10.9 oz)   SpO2: 98% 97% 94%     Weight change: 2.7 kg (5 lb 15.2 oz) Body mass index is 27.73 kg/(m^2).   Gen Exam: Awake and alert with clear speech.   Neck: Supple, No JVD.   Chest: B/L Clear.   CVS: S1 S2 Regular, no murmurs.  Abdomen: soft, BS +, non tender, non distended.  Extremities: no edema, lower extremities warm to touch. Neurologic: Non Focal.   Skin: No Rash.  Wounds: N/A.    Intake/Output from previous day:  Intake/Output Summary (Last 24 hours) at 11/22/12 1445 Last data filed at 11/22/12 0900  Gross per 24 hour  Intake    240 ml  Output    201 ml  Net     39 ml     LAB RESULTS: CBC  Lab 11/21/12 0440 11/21/12 0048 11/20/12 1548  WBC 6.4 5.2 5.8  HGB 10.4* 10.1* 10.0*  HCT 31.7* 30.4* 30.3*  PLT 350 339 336    MCV 83.2 82.8 84.6  MCH 27.3 27.5 27.9  MCHC 32.8 33.2 33.0  RDW 14.3 14.2 14.2  LYMPHSABS -- -- --  MONOABS -- -- --  EOSABS -- -- --  BASOSABS -- -- --  BANDABS -- -- --    Chemistries   Lab 11/21/12 0440 11/21/12 0048 11/20/12 1548  NA 140 -- 141  K 4.2 -- 4.7  CL 106 -- 108  CO2 22 -- 21  GLUCOSE 84 -- 126*  BUN 30* -- 33*  CREATININE 1.37* 1.46* 1.40*  CALCIUM 8.9 -- 9.3  MG 2.1 -- --    CBG:  Lab 11/22/12 1222 11/22/12 0855 11/22/12 0823 11/21/12 1104 11/21/12 0056  GLUCAP 95 79 67* 81 145*    GFR Estimated Creatinine Clearance: 22.1 ml/min (by C-G formula based on Cr of 1.37).  Coagulation profile No results found for this basename: INR:5,PROTIME:5 in the last 168 hours  Cardiac Enzymes  Lab 11/20/12 1548  CKMB --  TROPONINI <0.30  MYOGLOBIN --    No components found with this basename: POCBNP:3  Basename 11/20/12 1548  DDIMER 0.74*    Basename 11/21/12 0048  HGBA1C 6.6*   No results found for this basename: CHOL:2,HDL:2,LDLCALC:2,TRIG:2,CHOLHDL:2,LDLDIRECT:2 in the last 72 hours  Basename 11/21/12 0440  TSH 2.918  T4TOTAL --  T3FREE --  THYROIDAB --  No results found for this basename: VITAMINB12:2,FOLATE:2,FERRITIN:2,TIBC:2,IRON:2,RETICCTPCT:2 in the last 72 hours No results found for this basename: LIPASE:2,AMYLASE:2 in the last 72 hours  Urine Studies No results found for this basename: UACOL:2,UAPR:2,USPG:2,UPH:2,UTP:2,UGL:2,UKET:2,UBIL:2,UHGB:2,UNIT:2,UROB:2,ULEU:2,UEPI:2,UWBC:2,URBC:2,UBAC:2,CAST:2,CRYS:2,UCOM:2,BILUA:2 in the last 72 hours  MICROBIOLOGY: Recent Results (from the past 240 hour(s))  MRSA PCR SCREENING     Status: Abnormal   Collection Time   11/20/12  9:25 PM      Component Value Range Status Comment   MRSA by PCR POSITIVE (*) NEGATIVE Final     RADIOLOGY STUDIES/RESULTS: Dg Chest 2 View  11/20/2012  *RADIOLOGY REPORT*  Clinical Data: Short of breath  CHEST - 2 VIEW  Comparison: 11/14/2012  Findings: Large  hiatal hernia with air-fluid level.  Cardiac enlargement without heart failure.  No effusion.  Right upper lobe infiltrate has progressed in the interval and could be pneumonia.  Right rib fracture is present which may be acute.  It is not definitely seen on the prior study. Thoracic kyphoscoliosis.  IMPRESSION: Right upper lobe infiltrate has developed and may represent pneumonia.  Right posterior rib fracture, question acute   Original Report Authenticated By: Janeece Riggers, M.D.    Dg Chest 2 View  11/14/2012  *RADIOLOGY REPORT*  Clinical Data: Cough, shortness of breath, chest pain.  CHEST - 2 VIEW  Comparison: 05/23/2012  Findings: The patient is rotated to the right.  Mild cardiomegaly stable.  Tortuous thoracic aorta.  New airspace infiltrate in the right lower lobe.  No effusion.  IMPRESSION:  1.  Right lower lobe patchy airspace infiltrate suggesting pneumonia.   Original Report Authenticated By: D. Andria Rhein, MD    Ct Chest Wo Contrast  11/21/2012  *RADIOLOGY REPORT*  Clinical Data: Exertional dyspnea for 6 months.  Recently diagnosed with pneumonia.  History bladder cancer.  Hypertension.  Coronary artery disease.  Diabetes.  CT CHEST WITHOUT CONTRAST  Technique:  Multidetector CT imaging of the chest was performed following the standard protocol without IV contrast.  Comparison: 11/20/2012 plain film.  Most recent CT of 05/28/2011  Findings: Lungs/pleura: Mild degradation secondary patient arm position and minimal motion. Calcified granuloma or focus of left major fissural thickening on image 22/series 3.  There is also a calcified granuloma at the left lung base.  Patchy airspace disease throughout the right lung. Most prevalent in the right upper lobe. No pneumothorax.  Small bilateral pleural effusions which are new.  Heart/Mediastinum: Tortuous thoracic aorta, with atherosclerosis within.  Moderate to marked cardiomegaly with dense coronary artery atherosclerosis.  No pericardial effusion.   Paucity of mediastinal fat, without well-defined mediastinal adenopathy. Hilar regions poorly evaluated without intravenous contrast.  A moderate hiatal hernia.  Upper abdomen: Degradation continuing in the upper abdomen. Borderline gallbladder distention, without calcified stone or specific evidence of acute cholecystitis.  Vascular calcification medial the right kidney. Colonic stool burden suggests constipation.  Bones/Musculoskeletal:  Nonacute, but interval fractures of the 8th through eleventh posterolateral right ribs.  The eighth and ninth rib fractures are segmental. Accentuation of expected thoracic kyphosis.  Spinal curvature which is convex right centered about the thoracolumbar junction.  IMPRESSION:  1.  Multifocal right-sided airspace disease, most consistent with infection. 2.  Decreased sensitivity and specificity exam due to technique related factors, as described above. 3.  Nonacute but interval since 05/28/2011 right-sided rib fractures. 4.  Small bilateral pleural effusions. 5.  Moderate hiatal hernia.  6.  Advanced coronary artery disease.   Original Report Authenticated By: Jeronimo Greaves, M.D.    Nm Pulmonary  Perf And Vent  11/21/2012  *RADIOLOGY REPORT*  Clinical Data:  Shortness of breath.  Rule out pulmonary embolism.  NUCLEAR MEDICINE VENTILATION - PERFUSION LUNG SCAN  Technique:  Ventilation images were obtained in multiple projections using inhaled aerosol technetium 99 M DTPA.  Perfusion images were obtained in multiple projections after intravenous injection of Tc-45m MAA.  Radiopharmaceuticals:  41. Tc-3m DTPA aerosol and 5.1 mCi Tc- 61m MAA.  Comparison: Plain film 11/20/2012  Findings:  Ventilation:  Ventilation images demonstrate right hemidiaphragm elevation.  Perfusion:  Perfusion images demonstrate a subtledefect within the inferior lateral left hemithorax, most apparent on the LPO view. This is matched by a ventilation defect.  IMPRESSION: Low probability for pulmonary  embolism.  Single matched perfusion and ventilation defect in the posterior lateral aspect of the inferior left hemithorax.   Original Report Authenticated By: Jeronimo Greaves, M.D.     MEDICATIONS: Scheduled Meds:   . amantadine  100 mg Oral Daily  . amLODipine  10 mg Oral Daily  . aspirin  81 mg Oral Daily  . carbidopa-levodopa  0.5 tablet Oral QHS  . carbidopa-levodopa  1 tablet Oral Daily  . Chlorhexidine Gluconate Cloth  6 each Topical Q0600  . enoxaparin (LOVENOX) injection  30 mg Subcutaneous QHS  . famotidine  20 mg Oral BID  . feeding supplement  237 mL Oral Q1500  . furosemide  40 mg Oral Daily  . guaiFENesin  600 mg Oral BID  . ipratropium  0.5 mg Nebulization TID  . mirabegron ER  50 mg Oral Daily  . mupirocin ointment  1 application Nasal BID  . pantoprazole  40 mg Oral BID  . piperacillin-tazobactam (ZOSYN)  IV  3.375 g Intravenous Q8H  . Pramipexole Dihydrochloride  1 tablet Oral QHS  . selegiline  5 mg Oral QHS  . sodium chloride  3 mL Intravenous Q12H   Continuous Infusions:  PRN Meds:.sodium chloride, acetaminophen, albuterol, ondansetron (ZOFRAN) IV, sodium chloride, temazepam, traMADol-acetaminophen  Antibiotics: Anti-infectives     Start     Dose/Rate Route Frequency Ordered Stop   11/20/12 2330  piperacillin-tazobactam (ZOSYN) IVPB 3.375 g       3.375 g 12.5 mL/hr over 240 Minutes Intravenous 3 times per day 11/20/12 2324     11/20/12 1815   cefTRIAXone (ROCEPHIN) 1 g in dextrose 5 % 50 mL IVPB  Status:  Discontinued        1 g 100 mL/hr over 30 Minutes Intravenous Every 24 hours 11/20/12 1802 11/20/12 2324   11/20/12 1815   azithromycin (ZITHROMAX) tablet 500 mg        500 mg Oral  Once 11/20/12 1802 11/20/12 1839           Jeoffrey Massed, MD  [facility] Pager:[phone]  If 7PM-7AM, please contact night-coverage [email or web address] Password [email or web address] 11/22/2012, 2:45 PM   LOS: 2 days

## 2012-11-22 NOTE — Plan of Care (Signed)
Problem: Food- and Nutrition-Related Knowledge Deficit (NB-1.1) Goal: Nutrition education Formal process to instruct or train a patient/client in a skill or to impart knowledge to help patients/clients voluntarily manage or modify food choices and eating behavior to maintain or improve health.  Outcome: Completed/Met Date Met:  11/22/12 Nutrition Education Note  RD drawn to chart secondary to current prescription of Elderpryl, a medication with potential interactions with high tyramine-containing foods.  RD provided "Tyramine-Restricted Nutrition Therapy" handout from the [employer] of [employer] and discussed importance of this restriction while taking current medication. Reviewed list of foods to avoid. Teach back method used.  Expect Good compliance.  Body mass index is 27.73 kg/(m^2). Pt meets criteria for Overweight based on current BMI.  Current diet order is Regular, patient is consuming approximately 100% of meals at this time. Additional labs and medications reviewed.   RD will change current diet to Low Tyramine Diet to prevent potential food-medication interaction.   No further nutrition interventions warranted at this time. RD contact information provided. If additional nutrition issues arise, please re-consult RD.  Clarene Duke RD, LDN Pager [phone] After Hours pager [phone]

## 2012-11-23 DIAGNOSIS — K449 Diaphragmatic hernia without obstruction or gangrene: Secondary | ICD-10-CM

## 2012-11-23 LAB — GLUCOSE, CAPILLARY
Glucose-Capillary: 72 mg/dL (ref 70–99)
Glucose-Capillary: 110 mg/dL — ABNORMAL HIGH (ref 70–99)

## 2012-11-23 MED ORDER — AMOXICILLIN-POT CLAVULANATE 500-125 MG PO TABS: 1.0000 | ORAL_TABLET | Freq: Three times a day (TID) | ORAL | Status: DC

## 2012-11-23 MED ORDER — FAMOTIDINE 20 MG PO TABS: 20.0000 mg | ORAL_TABLET | Freq: Two times a day (BID) | ORAL | Status: DC

## 2012-11-23 MED ORDER — LOPERAMIDE HCL 2 MG PO CAPS
2.0000 mg | ORAL_CAPSULE | Freq: Once | ORAL | Status: AC
  Administered 2012-11-23: 2 mg via ORAL
  Filled 2012-11-23: qty 1

## 2012-11-23 MED ORDER — PANTOPRAZOLE SODIUM 40 MG PO TBEC: 40.0000 mg | DELAYED_RELEASE_TABLET | Freq: Two times a day (BID) | ORAL | Status: DC

## 2012-11-23 MED ORDER — IPRATROPIUM BROMIDE 0.02 % IN SOLN: 0.5000 mg | Freq: Four times a day (QID) | RESPIRATORY_TRACT | Status: DC | PRN

## 2012-11-23 MED ORDER — GUAIFENESIN ER 600 MG PO TB12: 600.0000 mg | ORAL_TABLET | Freq: Two times a day (BID) | ORAL | Status: DC

## 2012-11-23 NOTE — Discharge Summary (Addendum)
PATIENT DETAILS Name: [name] Age: 79 y.o. Sex: female Date of Birth: 07-07-1928 MRN: [id number]. Admit Date: 11/20/2012 Admitting Physician: Therisa Doyne, MD JXB:JYNWG,NFAOZHY Venetia Constable, MD  Recommendations for Outpatient Follow-up:  1. Repeat chest x-ray in 4-6 weeks to document clearance  PRIMARY DISCHARGE DIAGNOSIS:  Principal Problem:  *Aspiration pneumonia Active Problems:  HTN (hypertension)  DM (diabetes mellitus)  Parkinson's disease  CAD (coronary artery disease)  Hiatal hernia  Dyspnea on exertion      PAST MEDICAL HISTORY: Past Medical History  Diagnosis Date  . MI, old   . Parkinson disease   . Scoliosis   . Arthritis   . Bladder cancer   . HTN (hypertension) 04/21/2012  . Hyperlipidemia 04/21/2012  . DM (diabetes mellitus) 04/21/2012  . CAD (coronary artery disease) 04/21/2012  . Barrett esophagus 04/21/2012  . PUD (peptic ulcer disease) 04/21/2012  . Adrenal insufficiency 04/21/2012  . Raynaud's disease 04/21/2012  . Anxiety 04/21/2012  . Fibromyalgia 04/21/2012  . Hx of bladder cancer 04/21/2012  . Edema of lower extremity 04/21/2012    Chronic lower extremity edema  . Falls 04/21/2012    DISCHARGE MEDICATIONS:   Medication List     As of 11/23/2012  2:24 PM    STOP taking these medications         ibuprofen 200 MG tablet   Commonly known as: ADVIL,MOTRIN      omeprazole 20 MG capsule   Commonly known as: PRILOSEC   Replaced by: pantoprazole 40 MG tablet      TAKE these medications         amantadine 100 MG capsule   Commonly known as: SYMMETREL   Take 100 mg by mouth daily.      amLODipine 10 MG tablet   Commonly known as: NORVASC   Take 10 mg by mouth every evening.      amoxicillin-clavulanate 500-125 MG per tablet   Commonly known as: AUGMENTIN   Take 1 tablet (500 mg total) by mouth 3 (three) times daily.      aspirin 325 MG tablet   Take 325 mg by mouth daily.      carbidopa-levodopa 25-100 MG per tablet   Commonly known  as: SINEMET IR   Take 0.5-1 tablets by mouth 2 (two) times daily. 1 tab in the morning and 0.5 tab at bedtime      famotidine 20 MG tablet   Commonly known as: PEPCID   Take 1 tablet (20 mg total) by mouth 2 (two) times daily.      feeding supplement Liqd   Take 237 mLs by mouth daily at 3 pm.      Fish Oil 1000 MG Caps   Take 1,000 mg by mouth every evening.      furosemide 40 MG tablet   Commonly known as: LASIX   Take 40 mg by mouth daily.      guaiFENesin 600 MG 12 hr tablet   Commonly known as: MUCINEX   Take 1 tablet (600 mg total) by mouth 2 (two) times daily.      MIRAPEX ER 0.375 MG Tb24   Generic drug: Pramipexole Dihydrochloride   Take 0.375 tablets by mouth every evening.      MYRBETRIQ 50 MG Tb24   Generic drug: mirabegron ER   Take 50 mg by mouth daily.      OCUVITE PO   Take 1 tablet by mouth every evening.      OVER THE COUNTER MEDICATION   Place  2 drops into both eyes daily as needed. Lubricant eye drops for dry eyes      pantoprazole 40 MG tablet   Commonly known as: PROTONIX   Take 1 tablet (40 mg total) by mouth 2 (two) times daily.      selegiline 5 MG capsule   Commonly known as: ELDEPRYL   Take 5 mg by mouth every evening.      temazepam 30 MG capsule   Commonly known as: RESTORIL   Take 30 mg by mouth at bedtime.      traMADol-acetaminophen 37.5-325 MG per tablet   Commonly known as: ULTRACET   Take 1 tablet by mouth 2 (two) times daily as needed. For pain          BRIEF HPI:  See H&P, Labs, Consult and Test reports for all details in brief, patient is a 79 year old elderly female with her consent syndrome, no history of hiatal hernia who was admitted to the hospital for shortness of breath. Apparently this has been going on for at least 3 to 4 months prior to admission. Patient gives a history of severe reflux esophagitis, and claimed that when she's at times wakes up there is yellow regurgitation fluid around the pillow. She was  admitted for further evaluation and treatment.  CONSULTATIONS:   Gastroenterology   Surgery  PERTINENT RADIOLOGIC STUDIES: Dg Chest 2 View  11/20/2012  *RADIOLOGY REPORT*  Clinical Data: Short of breath  CHEST - 2 VIEW  Comparison: 11/14/2012  Findings: Large hiatal hernia with air-fluid level.  Cardiac enlargement without heart failure.  No effusion.  Right upper lobe infiltrate has progressed in the interval and could be pneumonia.  Right rib fracture is present which may be acute.  It is not definitely seen on the prior study. Thoracic kyphoscoliosis.  IMPRESSION: Right upper lobe infiltrate has developed and may represent pneumonia.  Right posterior rib fracture, question acute   Original Report Authenticated By: Janeece Riggers, M.D.    Dg Chest 2 View  11/14/2012  *RADIOLOGY REPORT*  Clinical Data: Cough, shortness of breath, chest pain.  CHEST - 2 VIEW  Comparison: 05/23/2012  Findings: The patient is rotated to the right.  Mild cardiomegaly stable.  Tortuous thoracic aorta.  New airspace infiltrate in the right lower lobe.  No effusion.  IMPRESSION:  1.  Right lower lobe patchy airspace infiltrate suggesting pneumonia.   Original Report Authenticated By: D. Andria Rhein, MD    Dg Abd 1 View  11/22/2012  *RADIOLOGY REPORT*  Clinical Data: Status post esophagram this same date.  ABDOMEN - 1 VIEW  Comparison: None.  Findings: Contrast material from the patient's esophagram is in mid and distal small bowel loops but has not yet reached the colon. There is residual contrast material in the patient's hiatal hernia. Marked convex right scoliosis is noted.  IMPRESSION: As above.   Original Report Authenticated By: Holley Dexter, M.D.    Ct Chest Wo Contrast  11/21/2012  *RADIOLOGY REPORT*  Clinical Data: Exertional dyspnea for 6 months.  Recently diagnosed with pneumonia.  History bladder cancer.  Hypertension.  Coronary artery disease.  Diabetes.  CT CHEST WITHOUT CONTRAST  Technique:  Multidetector  CT imaging of the chest was performed following the standard protocol without IV contrast.  Comparison: 11/20/2012 plain film.  Most recent CT of 05/28/2011  Findings: Lungs/pleura: Mild degradation secondary patient arm position and minimal motion. Calcified granuloma or focus of left major fissural thickening on image 22/series 3.  There is also  a calcified granuloma at the left lung base.  Patchy airspace disease throughout the right lung. Most prevalent in the right upper lobe. No pneumothorax.  Small bilateral pleural effusions which are new.  Heart/Mediastinum: Tortuous thoracic aorta, with atherosclerosis within.  Moderate to marked cardiomegaly with dense coronary artery atherosclerosis.  No pericardial effusion.  Paucity of mediastinal fat, without well-defined mediastinal adenopathy. Hilar regions poorly evaluated without intravenous contrast.  A moderate hiatal hernia.  Upper abdomen: Degradation continuing in the upper abdomen. Borderline gallbladder distention, without calcified stone or specific evidence of acute cholecystitis.  Vascular calcification medial the right kidney. Colonic stool burden suggests constipation.  Bones/Musculoskeletal:  Nonacute, but interval fractures of the 8th through eleventh posterolateral right ribs.  The eighth and ninth rib fractures are segmental. Accentuation of expected thoracic kyphosis.  Spinal curvature which is convex right centered about the thoracolumbar junction.  IMPRESSION:  1.  Multifocal right-sided airspace disease, most consistent with infection. 2.  Decreased sensitivity and specificity exam due to technique related factors, as described above. 3.  Nonacute but interval since 05/28/2011 right-sided rib fractures. 4.  Small bilateral pleural effusions. 5.  Moderate hiatal hernia.  6.  Advanced coronary artery disease.   Original Report Authenticated By: Jeronimo Greaves, M.D.    Dg Esophagus  11/22/2012  *RADIOLOGY REPORT*  Clinical Data: Reflux, hiatal  hernia.  ESOPHOGRAM/BARIUM SWALLOW  Technique:  Single contrast examination was performed using thin barium.  Fluoroscopy time:  3.38 minutes.  Comparison:  CT 11/21/2012  Findings:  The esophagus is mildly dilated.  The GE junction is patulous, dumping into a large hiatal hernia.  Contrast never emptied from the hiatal hernia.  There is free reflux from the hiatal hernia back into the esophagus.  Disruption of all primary esophageal peristaltic waves with stasis of contrast in the esophagus.  The patient swallowed a 13 mm tablet which passes into the hiatal hernia, but refluxes into the esophagus.  IMPRESSION: Large hiatal hernia with patulous GE junction and free passage of contrast to and from the esophagus into the hiatal hernia. Contrast within the hernia never empties or passes below the diaphragm. When comparing to yesterday's chest CT, I do not believe there is complete obstruction.  The lack of emptying was likely positional although I tried the left lateral decubitus position and upright position.  It may be helpful to follow the study with [facility] tonight or tomorrow to evaluate for emptying of the hiatal hernia.  These results were called to Dr. and at the time of interpretation.   Original Report Authenticated By: Charlett Nose, M.D.    Nm Pulmonary Perf And Vent  11/21/2012  *RADIOLOGY REPORT*  Clinical Data:  Shortness of breath.  Rule out pulmonary embolism.  NUCLEAR MEDICINE VENTILATION - PERFUSION LUNG SCAN  Technique:  Ventilation images were obtained in multiple projections using inhaled aerosol technetium 99 M DTPA.  Perfusion images were obtained in multiple projections after intravenous injection of Tc-63m MAA.  Radiopharmaceuticals:  41. Tc-16m DTPA aerosol and 5.1 mCi Tc- 52m MAA.  Comparison: Plain film 11/20/2012  Findings:  Ventilation:  Ventilation images demonstrate right hemidiaphragm elevation.  Perfusion:  Perfusion images demonstrate a subtledefect within the inferior lateral left  hemithorax, most apparent on the LPO view. This is matched by a ventilation defect.  IMPRESSION: Low probability for pulmonary embolism.  Single matched perfusion and ventilation defect in the posterior lateral aspect of the inferior left hemithorax.   Original Report Authenticated By: Jeronimo Greaves, M.D.  PERTINENT LAB RESULTS: CBC:  Basename 11/21/12 0440 11/21/12 0048  WBC 6.4 5.2  HGB 10.4* 10.1*  HCT 31.7* 30.4*  PLT 350 339   CMET CMP     Component Value Date/Time   NA 140 11/21/2012 0440   K 4.2 11/21/2012 0440   CL 106 11/21/2012 0440   CO2 22 11/21/2012 0440   GLUCOSE 84 11/21/2012 0440   BUN 30* 11/21/2012 0440   CREATININE 1.37* 11/21/2012 0440   CALCIUM 8.9 11/21/2012 0440   CALCIUM 12.1* 04/21/2012 2233   PROT 6.4 11/21/2012 0440   ALBUMIN 3.4* 11/21/2012 0440   AST 14 11/21/2012 0440   ALT <5 11/21/2012 0440   ALKPHOS 69 11/21/2012 0440   BILITOT 0.3 11/21/2012 0440   GFRNONAA 35* 11/21/2012 0440   GFRAA 40* 11/21/2012 0440    GFR Estimated Creatinine Clearance: 21.8 ml/min (by C-G formula based on Cr of 1.37). No results found for this basename: LIPASE:2,AMYLASE:2 in the last 72 hours  Basename 11/20/12 1548  CKTOTAL --  CKMB --  CKMBINDEX --  TROPONINI <0.30   No components found with this basename: POCBNP:3  Basename 11/20/12 1548  DDIMER 0.74*    Basename 11/21/12 0048  HGBA1C 6.6*   No results found for this basename: CHOL:2,HDL:2,LDLCALC:2,TRIG:2,CHOLHDL:2,LDLDIRECT:2 in the last 72 hours  Basename 11/21/12 0440  TSH 2.918  T4TOTAL --  T3FREE --  THYROIDAB --   No results found for this basename: VITAMINB12:2,FOLATE:2,FERRITIN:2,TIBC:2,IRON:2,RETICCTPCT:2 in the last 72 hours Coags: No results found for this basename: PT:2,INR:2 in the last 72 hours Microbiology: Recent Results (from the past 240 hour(s))  MRSA PCR SCREENING     Status: Abnormal   Collection Time   11/20/12  9:25 PM      Component Value Range Status Comment   MRSA by PCR  POSITIVE (*) NEGATIVE Final      BRIEF HOSPITAL COURSE:  Shortness of breath - This is likely secondary to aspiration pneumonia and severe reflux esophagitis. - An echocardiogram of the heart showed an EF around 45-50%. There was moderate hypokinesis of the inferior myocardium, and grade 2 diastolic dysfunction. - A VQ scan was of low probability, lower extremity Dopplers were also negative. - This is much better with treatment with antibiotics, she is being discharged today at her own request. I had a long discussion with the husband and the patient. Please see details below  Pneumonia - This given the clinical scenario it is highly suspicious for aspiration pneumonia - She was placed on Zosyn during this admission, she feels symptomatically much better. She is being transitioned to Augmentin on discharge.  Dysphagia - Is likely secondary to a large hiatal hernia and subsequent reflux - She was seen by Dr. Evette Cristal from [facility] gastroenterology, who did advised a Barium Esophagogram gram this was subsequently done, given the large hiatal hernia and given the fact that the patient was symptomatic, a surgical consultation was called. However patient does not want to undertake any further surgical intervention. She was also felt by surgery to be a high-risk candidate. Dr. Evette Cristal has no further input to offer. He does not think being EGD is going to change the management. -This is a very difficult situation, we will maximize her proton pump inhibitor and H2 blockers. Suggestions to minimize reflux, have been given to the patient and family.  - Patient was seen by speech therapy, and recommended to continue with a regular diet with thin liquids. - Long discussion had been held with the family by me,  they understand the risk of aspiration, the accepting all risks.  GERD/Hiatal Hernia  -c/w PPI and Pepcid  HTN (hypertension)  -Cont norvasc-BP controlled   Parkinson's disease  -c/w Sinemet,  Pramipexole and Amantadine  -stable at present   CAD  -c/w ASA  - Echocardiogram did show hypokinesis of the inferior wall, however her enzymes were negative. Patient is not keen on undergoing aggressive care. We'll try and optimize maximize medical management. She will followup with her primary cardiologist as an outpatient. Spoke with Dr. Katrinka Blazing, the hypokinesis is not a new finding and that has been present in the past as well  Chronic lower extremity edema  - Continue Lasix   CODE STATUS - DO NOT RESUSCITATE  TODAY-DAY OF DISCHARGE:  Subjective:   [name] today has no headache,no chest abdominal pain,no new weakness tingling or numbness, feels much better wants to go home today.   Objective:   Blood pressure 108/68, pulse 64, temperature 97.6 F (36.4 C), temperature source Oral, resp. rate 20, height 4\' 8"  (1.422 m), weight 54.8 kg (120 lb 13 oz), SpO2 97.00%.  Intake/Output Summary (Last 24 hours) at 11/23/12 1424 Last data filed at 11/23/12 0900  Gross per 24 hour  Intake    870 ml  Output      0 ml  Net    870 ml    Exam Awake Alert, Oriented *3, No new F.N deficits, Normal affect [place].AT,PERRAL Supple Neck,No JVD, No cervical lymphadenopathy appriciated.  Symmetrical Chest wall movement, Good air movement bilaterally, CTAB RRR,No Gallops,Rubs or new Murmurs, No Parasternal Heave +ve B.Sounds, Abd Soft, Non tender, No organomegaly appriciated, No rebound -guarding or rigidity. No Cyanosis, Clubbing or edema, No new Rash or bruise  DISCHARGE CONDITION: Stable  DISPOSITION: HOME  DISCHARGE INSTRUCTIONS:    Activity:  As tolerated with Full fall precautions use walker/cane & assistance as needed  Diet recommendation: Heart Healthy diet Regular Diet- thin liquid   Follow-up Information    Follow up with Allean Found, MD. Schedule an appointment as soon as possible for a visit in 1 week.   Contact information:   [address] [place]  [address] [phone]       Schedule an appointment as soon as possible for a visit with Shirley Friar., MD. (As needed)    Contact information:   [address] [place]nie [place] [address] [phone]       Schedule an appointment as soon as possible for a visit with Adolph Pollack, MD. (As needed)    Contact information:   [address] [place] [address] [phone]       Follow up with Lesleigh Noe, MD. Schedule an appointment as soon as possible for a visit in 2 weeks.   Contact information:   [address] [place] [address] [phone]         Total Time spent on discharge equals 45 minutes.  SignedJeoffrey Massed 11/23/2012 2:24 PM

## 2012-11-23 NOTE — Progress Notes (Signed)
Patient ambulated to bathroom with assist.  Patient is continent of bowel and bladder with occasional incontinence during sleep.  Will continue to monitor.  Macarthur Critchley, RN

## 2012-11-23 NOTE — Progress Notes (Signed)
Patient evaluated for long-term disease management services with [facility] Care Management Program. Patient will receive a post discharge transition of care call upon discharge. Spoke with patient and husband at bedside to explain services. Left [facility] Care Management packet and contact information.   Raiford Noble MSN, RN,BSN  [facility] - Inpatient Liaison [phone]

## 2012-11-23 NOTE — Consult Note (Signed)
SECRET KRISTENSEN Sep 30, 1928  [id number].     Requesting MD: Dr. Evette Cristal Chief Complaint/Reason for Consult: large hiatal hernia with reflux and delayed gastric emptying HPI:  [name] is a 79 y.o. female has a PMH of MI, old; Parkinson disease; Scoliosis; Arthritis; Bladder cancer; HTN (04/21/2012); Hyperlipidemia (04/21/2012); DM (diabetes mellitus) (04/21/2012); CAD (04/21/2012); Barrett esophagus (04/21/2012); PUD (04/21/2012); Adrenal insufficiency (04/21/2012); Raynaud's disease (04/21/2012); Anxiety (04/21/2012); Fibromyalgia (04/21/2012); bladder cancer (04/21/2012); Edema of lower extremity (04/21/2012); and Falls (04/21/2012).   She presented to [facility] with  With acute dyspnea and SOB yesterday.  She has a hx of dyspnea on exertion for the past 6 months and acutely 1 week ago went to [place] [facility] and was diagnosed with PNA. She has had some issues with reflux and possible aspiration.  She currently has persistent PNA and was found to have a large hiatal hernia which she has had for more than 20 years.  She states over the last 10-14 days she has had worsening issues with GERD/regurgitation during sleep.  The patient currently sleeps flat without wedge.  She currently takes only 20mg  QD Prilosec for GERD.  She notes she has always had trouble with gastric emptying because of duodenal spasms.  She also notes a h/o of PUD, Barrett's esophagus.    She and her husband note they are very hesitant to under-go surgery given her co-morbidities, risk of surgery under anesthesia, and noted this problem has only worsened over the last 1-2 weeks.  She wants to try conservative management including dietary and behavioral changes as well as a sleeping wedge.  She also notes very solemnly that she does not want to prolong life in the setting of her worsening Parkinson and scoliosis.       Family History  Problem Relation Age of Onset  . Heart disease Mother   . Diabetes Mother   . Bladder Cancer Maternal Aunt      Past Medical History  Diagnosis Date  . MI, old   . Parkinson disease   . Scoliosis   . Arthritis   . Bladder cancer   . HTN (hypertension) 04/21/2012  . Hyperlipidemia 04/21/2012  . DM (diabetes mellitus) 04/21/2012  . CAD (coronary artery disease) 04/21/2012  . Barrett esophagus 04/21/2012  . PUD (peptic ulcer disease) 04/21/2012  . Adrenal insufficiency 04/21/2012  . Raynaud's disease 04/21/2012  . Anxiety 04/21/2012  . Fibromyalgia 04/21/2012  . Hx of bladder cancer 04/21/2012  . Edema of lower extremity 04/21/2012    Chronic lower extremity edema  . Falls 04/21/2012    Past Surgical History  Procedure Date  . Tonsillectomy   . Appendectomy   . Abdominal hysterectomy     Social History:  reports that she has quit smoking. She does not have any smokeless tobacco history on file. She reports that she does not drink alcohol or use illicit drugs.  Allergies:  Allergies  Allergen Reactions  . Codeine Hives    Medications Prior to Admission  Medication Sig Dispense Refill  . amantadine (SYMMETREL) 100 MG capsule Take 100 mg by mouth daily.       Marland Kitchen amLODipine (NORVASC) 10 MG tablet Take 10 mg by mouth every evening.      Marland Kitchen aspirin 325 MG tablet Take 325 mg by mouth daily.      . carbidopa-levodopa (SINEMET IR) 25-100 MG per tablet Take 0.5-1 tablets by mouth 2 (two) times daily. 1 tab in the morning and 0.5 tab at bedtime      .  feeding supplement (ENSURE COMPLETE) LIQD Take 237 mLs by mouth daily at 3 pm.      . furosemide (LASIX) 40 MG tablet Take 40 mg by mouth daily.      Marland Kitchen ibuprofen (ADVIL,MOTRIN) 200 MG tablet Take 400 mg by mouth 2 (two) times daily as needed. For pain      . mirabegron ER (MYRBETRIQ) 50 MG TB24 Take 50 mg by mouth daily.       . Multiple Vitamins-Minerals (OCUVITE PO) Take 1 tablet by mouth every evening.      . Omega-3 Fatty Acids (FISH OIL) 1000 MG CAPS Take 1,000 mg by mouth every evening.      Marland Kitchen OMEPRAZOLE PO Take 1 tablet by mouth daily. Standard  over the counter dose      . OVER THE COUNTER MEDICATION Place 2 drops into both eyes daily as needed. Lubricant eye drops for dry eyes      . Pramipexole Dihydrochloride (MIRAPEX ER) 0.375 MG TB24 Take 0.375 tablets by mouth every evening.      . selegiline (ELDEPRYL) 5 MG capsule Take 5 mg by mouth every evening.      . temazepam (RESTORIL) 30 MG capsule Take 30 mg by mouth at bedtime.      . traMADol-acetaminophen (ULTRACET) 37.5-325 MG per tablet Take 1 tablet by mouth 2 (two) times daily as needed. For pain        Blood pressure 108/68, pulse 64, temperature 97.6 F (36.4 C), temperature source Oral, resp. rate 20, height 4\' 8"  (1.422 m), weight 120 lb 13 oz (54.8 kg), SpO2 97.00%. Physical Exam: General: pleasant, WD/WN white female who is sitting in the chair in NAD HEENT: head is normocephalic, atraumatic.  Sclera are noninjected.  PERRL.  Ears and nose without any masses or lesions.  Mouth is pink and moist Heart: regular, rate, and rhythm, no obvious murmurs heard Lungs:  Respiratory effort nonlabored, CTA Abd: soft, NT/ND, +BS, no masses, hernias, or organomegaly MS: severe scoliosis Skin: warm and dry  Psych: A&Ox3 with an appropriate affect.    Results for orders placed during the hospital encounter of 11/20/12 (from the past 48 hour(s))  GLUCOSE, CAPILLARY     Status: Normal   Collection Time   11/21/12 11:04 AM      Component Value Range Comment   Glucose-Capillary 81  70 - 99 mg/dL    Comment 1 Notify RN      Comment 2 Documented in Chart     GLUCOSE, CAPILLARY     Status: Abnormal   Collection Time   11/22/12  8:23 AM      Component Value Range Comment   Glucose-Capillary 67 (*) 70 - 99 mg/dL   GLUCOSE, CAPILLARY     Status: Normal   Collection Time   11/22/12  8:55 AM      Component Value Range Comment   Glucose-Capillary 79  70 - 99 mg/dL    Comment 1 Documented in Chart      Comment 2 Notify RN     GLUCOSE, CAPILLARY     Status: Normal   Collection Time    11/22/12 12:22 PM      Component Value Range Comment   Glucose-Capillary 95  70 - 99 mg/dL    Comment 1 Documented in Chart      Comment 2 Notify RN     GLUCOSE, CAPILLARY     Status: Normal   Collection Time   11/22/12  5:06 PM  Component Value Range Comment   Glucose-Capillary 92  70 - 99 mg/dL    Comment 1 Documented in Chart      Comment 2 Notify RN     GLUCOSE, CAPILLARY     Status: Abnormal   Collection Time   11/22/12  9:24 PM      Component Value Range Comment   Glucose-Capillary 143 (*) 70 - 99 mg/dL   GLUCOSE, CAPILLARY     Status: Normal   Collection Time   11/23/12  8:03 AM      Component Value Range Comment   Glucose-Capillary 72  70 - 99 mg/dL    Dg Abd 1 View  1/61/0960  *RADIOLOGY REPORT*  Clinical Data: Status post esophagram this same date.  ABDOMEN - 1 VIEW  Comparison: None.  Findings: Contrast material from the patient's esophagram is in mid and distal small bowel loops but has not yet reached the colon. There is residual contrast material in the patient's hiatal hernia. Marked convex right scoliosis is noted.  IMPRESSION: As above.   Original Report Authenticated By: Holley Dexter, M.D.    Dg Esophagus  11/22/2012  *RADIOLOGY REPORT*  Clinical Data: Reflux, hiatal hernia.  ESOPHOGRAM/BARIUM SWALLOW  Technique:  Single contrast examination was performed using thin barium.  Fluoroscopy time:  3.38 minutes.  Comparison:  CT 11/21/2012  Findings:  The esophagus is mildly dilated.  The GE junction is patulous, dumping into a large hiatal hernia.  Contrast never emptied from the hiatal hernia.  There is free reflux from the hiatal hernia back into the esophagus.  Disruption of all primary esophageal peristaltic waves with stasis of contrast in the esophagus.  The patient swallowed a 13 mm tablet which passes into the hiatal hernia, but refluxes into the esophagus.  IMPRESSION: Large hiatal hernia with patulous GE junction and free passage of contrast to and from the  esophagus into the hiatal hernia. Contrast within the hernia never empties or passes below the diaphragm. When comparing to yesterday's chest CT, I do not believe there is complete obstruction.  The lack of emptying was likely positional although I tried the left lateral decubitus position and upright position.  It may be helpful to follow the study with [facility] tonight or tomorrow to evaluate for emptying of the hiatal hernia.  These results were called to Dr. and at the time of interpretation.   Original Report Authenticated By: Charlett Nose, M.D.        Assessment/Plan Large hiatal hernia 1.  Long discussion with patient and her husband regarding possibility for surgery; however, the patient very strongly does not want surgery secondary to her co-morbidities and risk of surgery under anesthesia.   2.  Would recommend medication and behavioral management of GERD, dysphagia, regurgitation, delayed gastric emptying etc to Dr. Tiana Loft 3.  Dr. Abbey Chatters will see and eval the patient later today, and if she were to change her mind, she would need to be fully cleared from her PNA, cardiology pre-op work up accomplished and cleared, and the rest of her chronic medical conditions stable 4.  The surgery would likely be a long risky surgery (3-4 hours long) 5.  Will likely sign off if patients preference is towards no surgery, Dietitian, PCP, and GI following for conservative mgt  Multiple chronic medical conditions as above   DORT, Abimael Zeiter 11/23/2012, 9:39 AM Pager: [phone]

## 2012-11-23 NOTE — Evaluation (Signed)
Physical Therapy Evaluation Patient Details Name: [name] MRN: [id number] DOB: February 28, 1928 Today's Date: 11/23/2012 Time: 4098-1191 PT Time Calculation (min): 21 min  PT Assessment / Plan / Recommendation Clinical Impression  Pt an 79 yo female admitted for PNA who is now functioning at baseline. Pt very pleasant with good home set up and support. Pt functioning at mod I/supervision level and safe for d/c home with spouse. Pt with all DME needed. Pt with no further acute skilled PT needs at this time. PT signing off. Please re-consult if skilled PT needed in future.    PT Assessment  Patent does not need any further PT services    Follow Up Recommendations  No PT follow up;Supervision for mobility/OOB    Does the patient have the potential to tolerate intense rehabilitation      Barriers to Discharge        Equipment Recommendations  None recommended by PT    Recommendations for Other Services     Frequency      Precautions / Restrictions Precautions Precautions: Fall Restrictions Weight Bearing Restrictions: No   Pertinent Vitals/Pain 0/10 pain       Mobility  Bed Mobility Bed Mobility: Not assessed (pt received at sink) Details for Bed Mobility Assistance: per pt she doesn't have problems getting out in/out of bed Transfers Transfers: Stand to Sit Stand to Sit: 5: Supervision;With armrests;To chair/3-in-1 Details for Transfer Assistance: pt with safe technique Ambulation/Gait Ambulation/Gait Assistance: 5: Supervision Ambulation Distance (Feet): 100 Feet Assistive device: Rolling walker Ambulation/Gait Assistance Details: pt with safe walker management, no SOB or DOE, no LOB Gait Pattern: Within Functional Limits Gait velocity: wfl for age Stairs: No    Shoulder Instructions     Exercises     PT Diagnosis:    PT Problem List:   PT Treatment Interventions:     PT Goals Acute Rehab PT Goals PT Goal Formulation:  (n/a)  Visit Information  Last  PT Received On: 11/23/12 Assistance Needed: +1    Subjective Data  Subjective: Pt received standing at sink brushing teeth with tech. Pt agreeable to PT. Patient Stated Goal: home   Prior Functioning  Home Living Lives With: Spouse Available Help at Discharge: Family;Available 24 hours/day Type of Home:  (condo) Home Access: Level entry Home Layout: One level Bathroom Shower/Tub: Health visitor: Standard Bathroom Accessibility: Yes How Accessible: Accessible via walker Home Adaptive Equipment: Built-in shower seat;Walker - rolling Prior Function Level of Independence: Needs assistance Needs Assistance: Light Housekeeping;Dressing;Meal Prep Dressing: Minimal (husband helps pending style of shirt due to scoliosis ) Meal Prep: Moderate Light Housekeeping: Maximal (husband does the cleaning) Driving: No Vocation: Retired Comments: spouse is healthy and able to assist pt Communication Communication: HOH (has hearing aids) Dominant Hand: Right    Cognition  Overall Cognitive Status: Appears within functional limits for tasks assessed/performed Arousal/Alertness: Awake/alert Orientation Level: Oriented X4 / Intact Behavior During Session: [facility] for tasks performed    Extremity/Trunk Assessment Right Upper Extremity Assessment RUE ROM/Strength/Tone: [facility] for tasks assessed;Deficits RUE ROM/Strength/Tone Deficits: shld flexion 100 deg due to kyphotic/scoliotic thoracic spine RUE Sensation: WFL - Light Touch Left Upper Extremity Assessment LUE ROM/Strength/Tone: WFL for tasks assessed;Deficits LUE ROM/Strength/Tone Deficits: shld flexion limited to 100 deg due to kyphotic scoliotic thoracic spine LUE Sensation: WFL - Light Touch Right Lower Extremity Assessment RLE ROM/Strength/Tone: Within functional levels Left Lower Extremity Assessment LLE ROM/Strength/Tone: Within functional levels Trunk Assessment Trunk Assessment: Kyphotic (scoliotic)   Balance  Balance Balance  Assessed: Yes Dynamic Standing Balance Dynamic Standing - Balance Support: No upper extremity supported;During functional activity Dynamic Standing - Level of Assistance: 5: Stand by assistance Dynamic Standing - Balance Activities:  (brushing teeth and doing hair) Dynamic Standing - Comments: pt with no instability  End of Session PT - End of Session Activity Tolerance: Patient tolerated treatment well Patient left: in chair;with call bell/phone within reach Nurse Communication: Mobility status  GP     Marcene Brawn 11/23/2012, 8:44 AM  Lewis Shock, PT, DPT Pager #: [phone] Office #: [phone]

## 2012-11-23 NOTE — Consult Note (Signed)
Patient seen with husband in the room.  We discussed hiatal hernia, the repair, and the recovery.  She has progressive Parkinson's disease as well as other co-morbidities.  She does not want to have a major operation and, given her other conditions, I do not disagree with that.  I gave her some suggests to help her with the reflux and delayed gastric emptying.

## 2012-11-23 NOTE — Progress Notes (Signed)
The patient's barium swallow was reviewed which showed a large hiatal hernia. The radiologist called and stated that during the procedure the barium was not passing out of the hiatal hernia and refluxing back up into the esophagus. Case was discussed with Dr. Abbey Chatters from surgery this morning and surgical consult was placed for an opinion on possible repair of this large hiatal hernia which I believe is contributing to her symptoms of dysphagia, and reflux.

## 2012-11-23 NOTE — Progress Notes (Signed)
Pt. discharged to floor,verbalized understanding of discharged instruction,medication,restriction,diet and follow up appointment.Baseline Vitals sign stable,Pt comfortable,no sign and symptom of distress. 

## 2012-11-24 NOTE — Care Management Note (Signed)
    Page 1 of 1   11/24/2012     12:53:11 PM   CARE MANAGEMENT NOTE 11/24/2012  Patient:  [name], [name]   Account Number:  [id number]  Date Initiated:  11/24/2012  Documentation initiated by:  Letha Cape  Subjective/Objective Assessment:   dx pna  admit- lives with spouse.  pta independent.     Action/Plan:   pt eval- no pt follow up needed.   Anticipated DC Date:  11/23/2012   Anticipated DC Plan:  HOME/SELF CARE      DC Planning Services  CM consult      Choice offered to / List presented to:             Status of service:  Completed, signed off Medicare Important Message given?   (If response is "NO", the following Medicare IM given date fields will be blank) Date Medicare IM given:   Date Additional Medicare IM given:    Discharge Disposition:  HOME/SELF CARE  Per UR Regulation:  Reviewed for med. necessity/level of care/duration of stay  If discussed at Long Length of Stay Meetings, dates discussed:    Comments:  11/24/12 12:51 Letha Cape RN, BSN [phone] patient lives with spouse, pta independent.  Per physical therapy no pt needs.

## 2012-11-30 ENCOUNTER — Ambulatory Visit (INDEPENDENT_AMBULATORY_CARE_PROVIDER_SITE_OTHER): Payer: Medicare Other | Admitting: Family Medicine

## 2012-11-30 VITALS — BP 122/72 | HR 61 | Temp 98.0°F | Resp 17 | Ht <= 58 in | Wt 110.0 lb

## 2012-11-30 DIAGNOSIS — E860 Dehydration: Secondary | ICD-10-CM

## 2012-11-30 DIAGNOSIS — R5383 Other fatigue: Secondary | ICD-10-CM

## 2012-11-30 DIAGNOSIS — R197 Diarrhea, unspecified: Secondary | ICD-10-CM

## 2012-11-30 DIAGNOSIS — R5381 Other malaise: Secondary | ICD-10-CM

## 2012-11-30 LAB — POCT CBC
Granulocyte percent: 66.2 %G (ref 37–80)
HCT, POC: 35.4 % — AB (ref 37.7–47.9)
Hemoglobin: 10.7 g/dL — AB (ref 12.2–16.2)
Lymph, poc: 1.7 (ref 0.6–3.4)
MCH, POC: 26.4 pg — AB (ref 27–31.2)
MCHC: 30.2 g/dL — AB (ref 31.8–35.4)
MCV: 87.3 fL (ref 80–97)
MID (cbc): 1.5 — AB (ref 0–0.9)
MPV: 8.8 fL (ref 0–99.8)
POC Granulocyte: 6.4 (ref 2–6.9)
POC LYMPH PERCENT: 18.1 %L (ref 10–50)
POC MID %: 15.7 % — AB (ref 0–12)
Platelet Count, POC: 304 10*3/uL (ref 142–424)
RBC: 4.05 M/uL (ref 4.04–5.48)
RDW, POC: 15 %
WBC: 9.6 10*3/uL (ref 4.6–10.2)

## 2012-11-30 NOTE — Progress Notes (Signed)
 [facility]:  Office Visit  Chief Complaint:  Chief Complaint  Patient presents with  . Diarrhea    1 week   . Hernia  . Dehydration    HPI: [name] is a 79 y.o. female who complains of  7 day history of 4-5 episodes of diarrhea since discharge from hospital for aspiration PNA. She went to [facility]. She got IV abx and was discharged from the hospital with Augmentin. After the first dose of Augmentin she started having diarrhea. Pasty, nonbloody, nonodorous diarrhea.  Stopped taking augmentin 3 days ago. Prior to  that was on Avelox. No recent ravels. No other contacts with similar sxs. Has gotten stool samples and c diff cx at Dr. Betsy Pries office. History of MI 10 years ago with other significant medical problems including T2DM, Parkinsons, CAD, Bladder cancer;  recent echocardiogram shows EF 45-50 % with Grade 2 diastolic dysfxn. Has had fatigue, she states she has been eating and drinking normally, denies fevers, chills, nausea, abdominal pain. She has incontinence of stool which she did not have before. She called the nurses line for [facility] and they told her to try Lomotil. She has had one dose. Does not know if it is working.   Past Medical History  Diagnosis Date  . MI, old   . Parkinson disease   . Scoliosis   . Arthritis   . Bladder cancer   . HTN (hypertension) 04/21/2012  . Hyperlipidemia 04/21/2012  . DM (diabetes mellitus) 04/21/2012  . CAD (coronary artery disease) 04/21/2012  . Barrett esophagus 04/21/2012  . PUD (peptic ulcer disease) 04/21/2012  . Adrenal insufficiency 04/21/2012  . Raynaud's disease 04/21/2012  . Anxiety 04/21/2012  . Fibromyalgia 04/21/2012  . Hx of bladder cancer 04/21/2012  . Edema of lower extremity 04/21/2012    Chronic lower extremity edema  . Falls 04/21/2012   Past Surgical History  Procedure Date  . Tonsillectomy   . Appendectomy   . Abdominal hysterectomy    History   Social History  . Marital  Status: Married    Spouse Name: N/A    Number of Children: N/A  . Years of Education: N/A   Social History Main Topics  . Smoking status: Former Games developer  . Smokeless tobacco: None  . Alcohol Use: No  . Drug Use: No  . Sexually Active: No   Other Topics Concern  . None   Social History Narrative  . None   Family History  Problem Relation Age of Onset  . Heart disease Mother   . Diabetes Mother   . Bladder Cancer Maternal Aunt    Allergies  Allergen Reactions  . Codeine Hives   Prior to Admission medications   Medication Sig Start Date End Date Taking? Authorizing Provider  amantadine (SYMMETREL) 100 MG capsule Take 100 mg by mouth daily.    Yes Historical Provider, MD  amLODipine (NORVASC) 10 MG tablet Take 10 mg by mouth every evening.   Yes Historical Provider, MD  amoxicillin-clavulanate (AUGMENTIN) 500-125 MG per tablet Take 1 tablet (500 mg total) by mouth 3 (three) times daily. 11/23/12  Yes Shanker Levora Dredge, MD  aspirin 325 MG tablet Take 325 mg by mouth daily.   Yes Historical Provider, MD  carbidopa-levodopa (SINEMET IR) 25-100 MG per tablet Take 0.5-1 tablets by mouth 2 (two) times daily. 1 tab in the morning and 0.5 tab at bedtime   Yes Historical Provider, MD  famotidine (PEPCID) 20 MG tablet  Take 1 tablet (20 mg total) by mouth 2 (two) times daily. 11/23/12  Yes Shanker Levora Dredge, MD  feeding supplement (ENSURE COMPLETE) LIQD Take 237 mLs by mouth daily at 3 pm.   Yes Historical Provider, MD  furosemide (LASIX) 40 MG tablet Take 40 mg by mouth daily.   Yes Historical Provider, MD  guaiFENesin (MUCINEX) 600 MG 12 hr tablet Take 1 tablet (600 mg total) by mouth 2 (two) times daily. 11/23/12  Yes Shanker Levora Dredge, MD  mirabegron ER (MYRBETRIQ) 50 MG TB24 Take 50 mg by mouth daily.    Yes Historical Provider, MD  Multiple Vitamins-Minerals (OCUVITE PO) Take 1 tablet by mouth every evening.   Yes Historical Provider, MD  Omega-3 Fatty Acids (FISH OIL) 1000 MG CAPS Take  1,000 mg by mouth every evening.   Yes Historical Provider, MD  OVER THE COUNTER MEDICATION Place 2 drops into both eyes daily as needed. Lubricant eye drops for dry eyes   Yes Historical Provider, MD  pantoprazole (PROTONIX) 40 MG tablet Take 1 tablet (40 mg total) by mouth 2 (two) times daily. 11/23/12  Yes Shanker Levora Dredge, MD  Pramipexole Dihydrochloride (MIRAPEX ER) 0.375 MG TB24 Take 0.375 tablets by mouth every evening.   Yes Historical Provider, MD  selegiline (ELDEPRYL) 5 MG capsule Take 5 mg by mouth every evening.   Yes Historical Provider, MD  temazepam (RESTORIL) 30 MG capsule Take 30 mg by mouth at bedtime.   Yes Historical Provider, MD  traMADol-acetaminophen (ULTRACET) 37.5-325 MG per tablet Take 1 tablet by mouth 2 (two) times daily as needed. For pain   Yes Historical Provider, MD     ROS: The patient denies fevers, chills, night sweats, unintentional weight loss, chest pain, palpitations, wheezing, dyspnea on exertion, nausea, vomiting, abdominal pain, dysuria, hematuria, melena. All other systems have been reviewed and were otherwise negative with the exception of those mentioned in the HPI and as above.    PHYSICAL EXAM: Filed Vitals:   11/30/12 1446  BP: 122/72  Pulse: 61  Temp: 98 F (36.7 C)  Resp: 17   Filed Vitals:   11/30/12 1446  Height: 4\' 10"  (1.473 m)  Weight: 110 lb (49.896 kg)   Body mass index is 22.99 kg/(m^2).  General: Alert, no acute distress, frail white female.  HEENT:  Normocephalic, atraumatic, oropharynx patent. TM nl.  Dry oral mucosa.  Cardiovascular:  Regular rate and rhythm, no rubs, + systolic murmur, no gallops.  No Carotid bruits, radial pulse intact. No pedal edema.  Respiratory: Clear to auscultation bilaterally.  No wheezes, rales, or rhonchi.  No cyanosis, no use of accessory musculature GI: No organomegaly, abdomen is soft and non-tender, positive bowel sounds.  No masses. No signs of acute abdomen Skin: No rashes. Neurologic:  Facial musculature symmetric. Psychiatric: Patient is appropriate throughout our interaction. Lymphatic: No cervical lymphadenopathy Musculoskeletal: Gait is shuffled due to Parkinsons. + severe scoliosis   LABS: Results for orders placed in visit on 11/30/12  POCT CBC      Component Value Range   WBC 9.6  4.6 - 10.2 K/uL   Lymph, poc 1.7  0.6 - 3.4   POC LYMPH PERCENT 18.1  10 - 50 %L   MID (cbc) 1.5 (*) 0 - 0.9   POC MID % 15.7 (*) 0 - 12 %M   POC Granulocyte 6.4  2 - 6.9   Granulocyte percent 66.2  37 - 80 %G   RBC 4.05  4.04 - 5.48 M/uL  Hemoglobin 10.7 (*) 12.2 - 16.2 g/dL   HCT, POC 96.0 (*) 45.4 - 47.9 %   MCV 87.3  80 - 97 fL   MCH, POC 26.4 (*) 27 - 31.2 pg   MCHC 30.2 (*) 31.8 - 35.4 g/dL   RDW, POC 09.8     Platelet Count, POC 304  142 - 424 K/uL   MPV 8.8  0 - 99.8 fL     EKG/XRAY:   Primary read interpreted by Dr. Conley Rolls at [facility].   ASSESSMENT/PLAN: Encounter Diagnoses  Name Primary?  . Diarrhea Yes  . Fatigue   . Dehydration    ? C. Difficile after being on prolong abx and possibly PPI vs  Side effect of Augmentin.  She does not appear to have all the s/sx of C Diff: fever, abdominal pain, nausea, diarrhea. She has frequent stools, the stools I saw were actually formed and did not have a foul odor to them.  I will await for stool cx results before giving her more abx. We hydrated Mrs. Golomb slowly, tolerated fluids well considering her pulmonary and cardiac history, no crackles/rales after hydration, and she seemed better after 2 L of IVF.  CBC was WNL for her ( she has chronic anemia) F/u with patient and  stool results in AM    ,  PHUONG, DO 12/01/2012 7:40 AM

## 2012-11-30 NOTE — Progress Notes (Signed)
IV attempts x 2 in Left AC unsuccessful. Patient tolerated well. Earl Lites, MD successfully started a 22 G in right wrist.

## 2012-12-02 ENCOUNTER — Telehealth: Payer: Self-pay | Admitting: Family Medicine

## 2012-12-02 NOTE — Telephone Encounter (Signed)
Spoke with patient, told her that Dr. Michaelle Copas office got the C diff results back and it was negative for C. Diff. Bland diet. Probiotics once daily. Would not recommend lomotil regular. Use sparingly if needed for next 2-3 days but not more, otherwise constipation. If have fevers, chills, abd pain,nausea/vomniting then need to f/u prn. She is feeling better and having less frequent BMs.

## 2012-12-25 ENCOUNTER — Encounter: Payer: Self-pay | Admitting: Neurology

## 2012-12-25 DIAGNOSIS — F29 Unspecified psychosis not due to a substance or known physiological condition: Secondary | ICD-10-CM

## 2012-12-25 DIAGNOSIS — I959 Hypotension, unspecified: Secondary | ICD-10-CM

## 2012-12-25 DIAGNOSIS — F329 Major depressive disorder, single episode, unspecified: Secondary | ICD-10-CM

## 2012-12-25 DIAGNOSIS — R269 Unspecified abnormalities of gait and mobility: Secondary | ICD-10-CM

## 2012-12-25 DIAGNOSIS — B3749 Other urogenital candidiasis: Secondary | ICD-10-CM

## 2012-12-25 DIAGNOSIS — R413 Other amnesia: Secondary | ICD-10-CM

## 2012-12-31 ENCOUNTER — Other Ambulatory Visit: Payer: Self-pay | Admitting: Family Medicine

## 2012-12-31 ENCOUNTER — Ambulatory Visit
Admission: RE | Admit: 2012-12-31 | Discharge: 2012-12-31 | Disposition: A | Discharge status: Home / Self Care | Payer: Medicare Other | Source: Ambulatory Visit | Attending: Family Medicine | Admitting: Family Medicine

## 2012-12-31 DIAGNOSIS — Z09 Encounter for follow-up examination after completed treatment for conditions other than malignant neoplasm: Secondary | ICD-10-CM

## 2013-01-18 ENCOUNTER — Ambulatory Visit: Payer: Medicare Other

## 2013-01-18 ENCOUNTER — Ambulatory Visit (INDEPENDENT_AMBULATORY_CARE_PROVIDER_SITE_OTHER): Payer: Medicare Other | Admitting: Family Medicine

## 2013-01-18 VITALS — BP 112/54 | HR 63 | Temp 97.9°F | Resp 16 | Ht <= 58 in | Wt 105.0 lb

## 2013-01-18 DIAGNOSIS — M25559 Pain in unspecified hip: Secondary | ICD-10-CM

## 2013-01-18 DIAGNOSIS — G20 Parkinson's disease: Secondary | ICD-10-CM

## 2013-01-18 DIAGNOSIS — S7002XA Contusion of left hip, initial encounter: Secondary | ICD-10-CM

## 2013-01-18 DIAGNOSIS — M25552 Pain in left hip: Secondary | ICD-10-CM

## 2013-01-18 DIAGNOSIS — S7000XA Contusion of unspecified hip, initial encounter: Secondary | ICD-10-CM

## 2013-01-18 DIAGNOSIS — W19XXXD Unspecified fall, subsequent encounter: Secondary | ICD-10-CM

## 2013-01-18 MED ORDER — MELOXICAM 7.5 MG PO TABS: 7.5000 mg | ORAL_TABLET | Freq: Every day | ORAL | Status: DC

## 2013-01-18 NOTE — Progress Notes (Addendum)
This is an 79 year old woman brought in by her husband and daughter for evaluation of left hip pain following a fall on March 6 and then again 4 days prior to this visit. She's there is a large black and blue area on her left hip and it is tender even with light touch. The swollen area has gone down in size but is still quite noticeable. Patient is able to bear weight without too much pain but sleep is a problem.  She denies other sore areas including her chest, knees, and head. She has a long history of falling down because of her Parkinson's. She is start hesitation and often falls forward when she gets up and starts to  She does not get much pain relief with tramadol. Ibuprofen works better but it irritates her stomach.  Objective: No acute distress Patient is able to move her hip up and down when seated. She has a large 10 cm ecchymotic and indurated area over the left trochanter.  Marked kyphoscoliosis  [facility] reading (PRIMARY) by  Dr. Elbert Ewings hip:  No fx  Assessment:  . Hip pain, acute, left - Plan: DG Hip Complete Left, meloxicam (MOBIC) 7.5 MG tablet, DISCONTINUED: meloxicam (MOBIC) 7.5 MG tablet  Falls, subsequent encounter  Parkinson's disease  Hematoma of hip, left, initial encounter

## 2013-01-20 ENCOUNTER — Encounter: Payer: Self-pay | Admitting: Neurology

## 2013-01-20 ENCOUNTER — Ambulatory Visit (INDEPENDENT_AMBULATORY_CARE_PROVIDER_SITE_OTHER): Payer: Medicare Other | Admitting: Neurology

## 2013-01-20 VITALS — BP 125/71 | HR 58 | Ht <= 58 in | Wt 108.0 lb

## 2013-01-20 DIAGNOSIS — R269 Unspecified abnormalities of gait and mobility: Secondary | ICD-10-CM

## 2013-01-20 DIAGNOSIS — W19XXXA Unspecified fall, initial encounter: Secondary | ICD-10-CM

## 2013-01-20 DIAGNOSIS — G20 Parkinson's disease: Secondary | ICD-10-CM

## 2013-01-20 DIAGNOSIS — N390 Urinary tract infection, site not specified: Secondary | ICD-10-CM

## 2013-01-20 DIAGNOSIS — F329 Major depressive disorder, single episode, unspecified: Secondary | ICD-10-CM

## 2013-01-20 DIAGNOSIS — R296 Repeated falls: Secondary | ICD-10-CM

## 2013-01-20 DIAGNOSIS — W19XXXD Unspecified fall, subsequent encounter: Secondary | ICD-10-CM

## 2013-01-20 DIAGNOSIS — Z9181 History of falling: Secondary | ICD-10-CM

## 2013-01-20 DIAGNOSIS — F32A Depression, unspecified: Secondary | ICD-10-CM

## 2013-01-20 DIAGNOSIS — R413 Other amnesia: Secondary | ICD-10-CM

## 2013-01-20 DIAGNOSIS — G20A1 Parkinson's disease without dyskinesia, without mention of fluctuations: Secondary | ICD-10-CM

## 2013-01-20 HISTORY — DX: Urinary tract infection, site not specified: N39.0

## 2013-01-20 MED ORDER — CARBIDOPA-LEVODOPA 25-100 MG PO TABS: 1.0000 | ORAL_TABLET | Freq: Two times a day (BID) | ORAL | Status: DC

## 2013-01-20 NOTE — Patient Instructions (Addendum)
Let us continue with your current medications. I think overall you are doing reasonably well at this time. The only changes I suggest today are: 1. Stop the Selegiline as it was not helpful and 2. Increase sinemet to 1 pill twice daily, namely at 9 AM and 9 PM. Since you had a fall and hit your head last night, I would like to do a plain Head CT to make sure you did not have an internal head bleed. I would like to see you back in 3 months, sooner if the need arises. Please call with any interim questions, concerns, problems or updates or refill requests. Since this is our first visit together I do not really want to rock the boat too much. Please call for results of your CT scan.

## 2013-01-20 NOTE — Progress Notes (Signed)
Subjective:    Patient ID: [name] is a 79 y.o. female.  HPI Interim Hx: [name] is a very pleasant 79 year old right-handed woman who presents for followup consultation of her Parkinson's disease. She is accompanied by her husband today. This is a first visit together and she previously followed with Dr. Sandria Manly for 11 or 12 years. She was last seen by him on 09/27/2012 at which time he suggested a trial of selegiline for her complaint of freezing episodes. She fell last night and has a bruise eye. Previous to this, she fell at her son's house on the hardwood floor and fell again on the same side, L hip and sustained a large hematoma, but no Fx of the hip. She had an Xray. Last night, she could not sleep and was walking w her walker to the office and as she was transferring to the office chair she   I reviewed her past notes with Dr. Sandria Manly and her records and the following is a summary of the review of her records:   The patient has an 11 year history of idiopathic Parkinson's disease, responsive to pramipexole. She has had some dyskinesias but otherwise has been able to function well. She has significant back disease with deformities and has spinal stenosis as well as severe scoliosis. She does report motor fluctuations, some postural hypotension and occasional confusion. This is related to her medications as well as to a history of recurrent urinary tract infections.  She has developed freezing episodes while walking, a tendency to fall backwards, and some memory loss. Her current medications include pramipexole 0.375 mg long-acting once daily at 5 PM, Sinemet generic 25/100 mg strength one tablet at 9 AM, half a tablet at 6 PM. She has been placed on amantadine 100 mg once daily. Selegiline 5 mg once daily. She is currently denying hallucinations or confusion. She stopped driving. She is independent in her ADLs. She takes her medication correctly. She describes freezing and muscle cramps which  mostly occur at night. Her toes curl. Her husband rubs her legs. Most of her spasms and cramps are above and below the knees. She does not exercise.She walks well with her walker. She is having difficulty sleeping at night. She goes to bed about 12-1 AM and sleeps to 9 AM. Her husband has to wake up to help her go to bed. She describes restless legs symptoms.She states that she has to get up, walk, and take an aspirin. Her husband states that she is very slow in dressing, toileting and bathing. She has spells of sleepiness during the day. On 04/15/2012= MMSE 20/30, CDT 4/4, AFT 11. She had been placed on Aricept generic at 5 mg strength as well as Effexor 37.5 mg for her memory and for depression. In June 2013 she fell, striking her head and was admitted to the hospital for one week with delusions and hallucinations. Her calcium was 14 and creatinine 4.0. Selegiline, Effexor, and donepezil were discontinued. She was discharged to [facility] nursing [facility] for 2 1/2 weeks. In the nursing home she fell off the toilet. She continued to improve in terms of her motor skills. Her memory improved going from MMSE 24/30 to 29/30 on 05/27/12. Her major complaint is freezing episodes. On her last visit on 09/27/2012= MMSE 26/30, CDT, AFT16.  Her  Past Medical History  Diagnosis Date   MI, old    Parkinson disease    Scoliosis    Arthritis    Bladder cancer  HTN (hypertension) 04/21/2012   Hyperlipidemia 04/21/2012   DM (diabetes mellitus) 04/21/2012   CAD (coronary artery disease) 04/21/2012   Barrett esophagus 04/21/2012   PUD (peptic ulcer disease) 04/21/2012   Adrenal insufficiency 04/21/2012   Raynaud's disease 04/21/2012   Anxiety 04/21/2012   Fibromyalgia 04/21/2012   Hx of bladder cancer 04/21/2012   Edema of lower extremity 04/21/2012    Chronic lower extremity edema   Falls 04/21/2012   Recurrent UTI 01/20/2013    Her  Past Surgical History  Procedure Laterality Date   Tonsillectomy      Appendectomy     Abdominal hysterectomy      Her  Family History  Problem Relation Age of Onset   Heart disease Mother    Diabetes Mother    Bladder Cancer Maternal Aunt     Her  History   Social History   Marital Status: Married    Spouse Name: N/A    Number of Children: N/A   Years of Education: N/A   Social History Main Topics   Smoking status: Former Smoker    Types: Cigarettes   Smokeless tobacco: None   Alcohol Use: No   Drug Use: No   Sexually Active: No   Other Topics Concern   None   Social History Narrative   None    Allergies  Allergen Reactions   Mirtazapine    Codeine Hives  :   Outpatient Encounter Prescriptions as of 01/20/2013  Medication Sig Dispense Refill   amantadine (SYMMETREL) 100 MG capsule Take 100 mg by mouth daily.        amLODipine (NORVASC) 10 MG tablet Take 10 mg by mouth every evening.       aspirin 81 MG tablet Take 81 mg by mouth daily.       carbidopa-levodopa (SINEMET IR) 25-100 MG per tablet Take 1 tablet by mouth 2 (two) times daily. Take at 9 AM and 9 PM  60 tablet  5   famotidine (PEPCID) 20 MG tablet Take 1 tablet (20 mg total) by mouth 2 (two) times daily.  60 tablet  0   feeding supplement (ENSURE COMPLETE) LIQD Take 237 mLs by mouth daily at 3 pm.       furosemide (LASIX) 40 MG tablet Take 40 mg by mouth daily.       mirabegron ER (MYRBETRIQ) 50 MG TB24 Take 50 mg by mouth daily.        Multiple Vitamins-Minerals (OCUVITE PO) Take 1 tablet by mouth every evening.       Omega-3 Fatty Acids (FISH OIL) 1000 MG CAPS Take 1,000 mg by mouth every evening.       pantoprazole (PROTONIX) 40 MG tablet Take 1 tablet (40 mg total) by mouth 2 (two) times daily.  60 tablet  0   Pramipexole Dihydrochloride (MIRAPEX ER) 0.375 MG TB24 Take 0.375 tablets by mouth every evening.       temazepam (RESTORIL) 30 MG capsule Take 30 mg by mouth at bedtime.       traMADol-acetaminophen (ULTRACET) 37.5-325 MG per  tablet Take 1 tablet by mouth 2 (two) times daily as needed. For pain       [DISCONTINUED] carbidopa-levodopa (SINEMET IR) 25-100 MG per tablet Take 0.5-1 tablets by mouth 2 (two) times daily. 1 tab in the morning and 0.5 tab at bedtime       [DISCONTINUED] selegiline (ELDEPRYL) 5 MG capsule Take 5 mg by mouth every evening.       amoxicillin-clavulanate (  AUGMENTIN) 500-125 MG per tablet Take 1 tablet (500 mg total) by mouth 3 (three) times daily.  14 tablet  0   clonazePAM (KLONOPIN) 0.5 MG tablet Take 0.5 mg by mouth once.       guaiFENesin (MUCINEX) 600 MG 12 hr tablet Take 1 tablet (600 mg total) by mouth 2 (two) times daily.  10 tablet  0   meloxicam (MOBIC) 7.5 MG tablet Take 1 tablet (7.5 mg total) by mouth daily.  30 tablet  0   omeprazole (PRILOSEC) 10 MG capsule Take 5 mg by mouth daily.       OVER THE COUNTER MEDICATION Place 2 drops into both eyes daily as needed. Lubricant eye drops for dry eyes       No facility-administered encounter medications on file as of 01/20/2013.  :  Review of Systems  Constitutional: Negative for unexpected weight change.  Eyes: Positive for visual disturbance (blurred vision and double vision).  Gastrointestinal: Positive for diarrhea and constipation.  Endocrine: Positive for polydipsia.  Genitourinary: Positive for enuresis.  Neurological: Positive for tremors.       Diskinesia  Psychiatric/Behavioral: Positive for confusion, sleep disturbance (too much slep or not enough sleep) and decreased concentration.    Objective:  Neurologic Exam  Physical Exam  Physical Examination:   Filed Vitals:   01/20/13 1127  BP: 125/71  Pulse: 58    General Examination: The patient is a very pleasant 79 y.o. female in no acute distress.  HEENT: Normocephalic, atraumatic, pupils are equal, round and reactive to light and accommodation. Extraocular tracking is good without nystagmus noted. She has a bruise around her right eye. Gaze is conjugate.  Normal smooth pursuit is noted. Hearing is grossly intact. Face is symmetric with mild facial masking noted. Speech is clear with no dysarthria noted. Voice is slightly soft. There is no lip, neck or jaw tremor. Neck is mildly rigid. There are no carotid bruits on auscultation. Oropharynx exam reveals normal findings. No significant airway crowding is noted. Mallampati is class II. Tongue protrudes centrally and palate elevates symmetrically.  Chest: is clear to auscultation without wheezing, rhonchi or crackles noted.  Heart: sounds are normal without murmurs, rubs or gallops noted.  Abdomen: is soft, non-tender and non-distended with normal bowel sounds appreciated on auscultation.  Extremities: There is no pitting edema in the distal lower extremities bilaterally. Pedal pulses are intact.  Skin: is warm and dry with no trophic changes noted.  Musculoskeletal: exam reveals significant scoliosis, uneven shoulder height.  Neurologically: Mental status: The patient is awake, alert and oriented in all 4 spheres. Her Memory, attention, language and knowledge are appropriate. There is no aphasia, agnosia, apraxia or anomia. Cranial nerves are as described above under HEENT exam.  Motor exam: Normal bulk, strength and tone is noted. There is no drift, tremor or rebound. Romberg is negative. Reflexes are 1+ throughout. Fine motor skills are moderately impaired with finger taps and hand movements  Bilaterally. She has moderate to severe impairment of foot taps on the right better on the left. She has a slight postural and action tremor on the right only. Cerebellar testing shows no dysmetria or intention tremor on finger to nose testing. There is no truncal or gait ataxia.  Sensory exam is intact to light touch, pinprick, vibration, temperature sense. She needs some assistance to stand up from the seated position. She has a severely stooped posture and abnormal kyphoscoliosis. She walks with a rolling  walker and maneuvers it fairly well.  She has no significant episode of freezing but does have some start hesitation.                   Assessment and Plan:  In summary, ALITZEL COOKSON is a very pleasant 79 y.o.-year old female with a over 11 year history of Parkinson's disease, doing relatively well at this time with exam stable when I compare my findings with Dr. Imagene Gurney from November of last year. I had a long chat with the patient and her husband about my findings and the diagnosis, its prognosis and treatment options. We talked about medical treatments and non-pharmacological approaches. We talked about maintaining a healthy lifestyle in general. I encouraged the patient to eat healthy, exercise in the form of walking with her walker and keep well hydrated, to keep a scheduled bedtime and wake time routine, to not skip any meals and eat healthy snacks in between meals.  I recommended the following at this time: I would like for her to stop the selegiline said she did not notice much in the way of improvement in her freezing after restarting it. She had taken it for many years but it was stopped in June of last year after she had fallen and was hospitalized with hallucinations and confusion. I would like to increase her Sinemet to one whole pill twice daily. She is supposed to take it at 9 AM and 9 PM. She's to continue her pramipexole and amantadine at this time. Since this is our first visit together would like to get a better feel for her before we make any major changes in her medications. I answered all her and her husband's questions today and the patient and her husband were in agreement. I would like to see her back in 3 months, sooner if the need arises and encouraged them to call with any interim questions, concerns, problems or updates and refill requests.

## 2013-01-21 ENCOUNTER — Telehealth: Payer: Self-pay | Admitting: *Deleted

## 2013-01-24 ENCOUNTER — Telehealth: Payer: Self-pay | Admitting: Neurology

## 2013-01-25 ENCOUNTER — Telehealth: Payer: Self-pay | Admitting: *Deleted

## 2013-01-25 NOTE — Telephone Encounter (Signed)
Spoke with [name] and he explained that he wanted to discuss the necessity of the CT with his daughter. He may decide to have it done at a later date  

## 2013-01-25 NOTE — Telephone Encounter (Signed)
Spoke with [name] and he explained that he wanted to discuss the necessity of the CT with his daughter. He may decide to have it done at a later date

## 2013-01-28 ENCOUNTER — Ambulatory Visit
Admission: RE | Admit: 2013-01-28 | Discharge: 2013-01-28 | Disposition: A | Discharge status: Home / Self Care | Payer: Medicare Other | Source: Ambulatory Visit | Attending: Neurology | Admitting: Neurology

## 2013-01-28 DIAGNOSIS — R269 Unspecified abnormalities of gait and mobility: Secondary | ICD-10-CM

## 2013-01-28 DIAGNOSIS — R296 Repeated falls: Secondary | ICD-10-CM

## 2013-01-28 NOTE — Procedures (Signed)
[facility]  NEUROIMAGING REPORT   STUDY DATE: 01/28/2013 PATIENT NAME: [name] DOB: 07-Dec-1927 MRN: [id number]  ORDERING CLINICIAN: Huston Foley CLINICAL HISTORY:  8 year lady being evaluated for falls  EXAM: CT Head Wo TECHNIQUE: 5 mm axial images were obtained from the skull base to the vertex on HiSpeed CT scanner. Comparison CT head 05/23/2012 CONTRAST: None IMAGING SITE: [place] [facility]  FINDINGS:  The brain parenchyma shows mild changes of age-appropriate chronic microvascular ischemia. There is mild degree of generalized cerebral atrophy which also appears is appropriate. There are calcific lung changes noted in the falx, choroid plexuses, pineal gland which are age-appropriate. X-ray showed been scheduled to normal. No infarct, tumor or mass effect is noted. The paranasal sinuses appear unremarkable. The calvarium shows no abnormalities.  IMPRESSION:  This noncontrast CT scan of the head shows mild age-appropriate changes of chronic microvascular ischemia and generalized cerebral atrophy. Overall no significant changes compared with previous CT head dated 05/23/2012   INTERPRETING PHYSICIAN:  Delia Heady, MD Certified in Neurology,Vascular neurology and Neuroimaging  [facility] [address] [place], [place] [address] [phone]

## 2013-01-31 NOTE — Progress Notes (Signed)
Quick Note:  Please call patient or her husband and tell him that the head CT did not show any intracranial hemorrhage thankfully. Overall it shows mild age-appropriate changes in the reading physician did not think he was significantly changed compared to previous CT head dated 05/23/2012, thx Huston Foley, MD, PhD [facility] ([facility]    ______

## 2013-02-01 ENCOUNTER — Telehealth: Payer: Self-pay | Admitting: *Deleted

## 2013-02-01 NOTE — Telephone Encounter (Signed)
Message copied by Avie Echevaria on Tue Feb 01, 2013  9:02 AM ------      Message from: Huston Foley      Created: Mon Jan 31, 2013  4:07 PM       Please call patient or her husband and tell him that the head CT did not show any intracranial hemorrhage thankfully. Overall it shows mild age-appropriate changes in the reading physician did not think he was significantly changed compared to previous CT head dated 05/23/2012, thx      Huston Foley, MD, PhD      [facility] ([facility]                   ------

## 2013-02-01 NOTE — Telephone Encounter (Signed)
Spoke with Mrs Tiede and relayed CT findings.  She had no questions.  sm

## 2013-02-14 ENCOUNTER — Other Ambulatory Visit: Payer: Self-pay

## 2013-02-14 DIAGNOSIS — G20 Parkinson's disease: Secondary | ICD-10-CM

## 2013-02-14 MED ORDER — AMANTADINE HCL 100 MG PO CAPS: 100.0000 mg | ORAL_CAPSULE | Freq: Every day | ORAL | Status: DC

## 2013-02-14 MED ORDER — PRAMIPEXOLE DIHYDROCHLORIDE ER 0.375 MG PO TB24: 0.3750 | ORAL_TABLET | Freq: Every evening | ORAL | Status: DC

## 2013-02-14 MED ORDER — CARBIDOPA-LEVODOPA 25-100 MG PO TABS: 1.0000 | ORAL_TABLET | Freq: Two times a day (BID) | ORAL | Status: DC

## 2013-02-17 ENCOUNTER — Telehealth: Payer: Self-pay | Admitting: Neurology

## 2013-03-01 ENCOUNTER — Other Ambulatory Visit: Payer: Self-pay

## 2013-03-01 MED ORDER — PRAMIPEXOLE DIHYDROCHLORIDE ER 0.375 MG PO TB24: 0.3750 | ORAL_TABLET | Freq: Every evening | ORAL | Status: DC

## 2013-03-01 NOTE — Telephone Encounter (Signed)
Patient requesting refill at [facility] while she is waiting on mail order.

## 2013-03-02 ENCOUNTER — Telehealth: Payer: Self-pay

## 2013-03-02 NOTE — Telephone Encounter (Signed)
Spouse called clinic and left message saying his wife needed a refill on Mirapex, (which was sent yesterday to [facility] per patient request), however he also indicates they want a temp supply of generic Pramipexole, not Mirapex ER and it is a complicated situation.  Asked for a call back so he could explain.  I called back, got no answer.  Left message.

## 2013-03-04 NOTE — Telephone Encounter (Signed)
I called again.  Got no answer.  Left message.  Unable to reach patient.

## 2013-03-14 ENCOUNTER — Ambulatory Visit: Payer: Medicare Other

## 2013-03-14 ENCOUNTER — Ambulatory Visit (INDEPENDENT_AMBULATORY_CARE_PROVIDER_SITE_OTHER): Payer: Medicare Other | Admitting: Family Medicine

## 2013-03-14 VITALS — BP 108/66 | HR 56 | Temp 98.0°F | Resp 17 | Ht <= 58 in | Wt 104.0 lb

## 2013-03-14 DIAGNOSIS — R0781 Pleurodynia: Secondary | ICD-10-CM

## 2013-03-14 DIAGNOSIS — L309 Dermatitis, unspecified: Secondary | ICD-10-CM

## 2013-03-14 DIAGNOSIS — M79642 Pain in left hand: Secondary | ICD-10-CM

## 2013-03-14 DIAGNOSIS — L259 Unspecified contact dermatitis, unspecified cause: Secondary | ICD-10-CM

## 2013-03-14 DIAGNOSIS — R079 Chest pain, unspecified: Secondary | ICD-10-CM

## 2013-03-14 DIAGNOSIS — R0789 Other chest pain: Secondary | ICD-10-CM

## 2013-03-14 DIAGNOSIS — M79609 Pain in unspecified limb: Secondary | ICD-10-CM

## 2013-03-14 MED ORDER — TRIAMCINOLONE ACETONIDE 0.1 % EX CREA: TOPICAL_CREAM | Freq: Two times a day (BID) | CUTANEOUS | Status: DC

## 2013-03-14 NOTE — Patient Instructions (Addendum)
I will give you a call tonight once your x-ray reports come in. Let's plan to have you seen here in 1 or 2 weeks to recheck your hand, unless we end up having you see an orthopedist.    If you develop any sudden worsening of your breathing please get help right away.    Be sure to take a few good deep breaths several times a day to help prevent the development of pneumonia.

## 2013-03-14 NOTE — Progress Notes (Signed)
[facility] [address], [place] [address] [id number]  Date:  03/14/2013   Name:  [name]   DOB:  1928/03/11   MRN:  [id number]  PCP:  Allean Found, MD    Chief Complaint: Rib Injury and Hand Injury   History of Present Illness:  [name] is a 79 y.o. very pleasant female patient who presents with the following:  Last evening while in a closet at home she reached for a door knob and missed- she fell onto her left side.  She did not hit her head.  She bruised her left hand, and noted pain in her left ribs.  She is afraid she "might have cracked a rib."   It hurts her to breathe.  However, she is not SOB. Her general physical condition is getting worse as far as her parkinson's disease, andshe is using a walker most of the time at home and a WC for outings.   She uses ibuprofen as needed fr pain.  She does have some ultram but does not think that it helps her all that much.    Patient Active Problem List   Diagnosis Date Noted  . Recurrent UTI 01/20/2013  . Hiatal hernia 11/21/2012  . Dyspnea on exertion 11/21/2012  . Community acquired pneumonia 11/20/2012  . Depression 09/27/2012  . Memory loss 09/27/2012  . Gait disturbance 09/27/2012  . Candida UTI 09/27/2012  . Hypotension, Orthostatic 09/27/2012  . Confusion 09/27/2012  . Bradycardia 05/23/2012  . Constipation 04/25/2012  . Hypokalemia 04/22/2012  . Encephalopathy acute 04/21/2012  . Hypercalcemia 04/21/2012  . Renal failure (ARF), acute on chronic 04/21/2012  . Dehydration 04/21/2012  . HTN (hypertension) 04/21/2012  . Hyperlipidemia 04/21/2012  . DM (diabetes mellitus) 04/21/2012  . Parkinson's disease 04/21/2012  . CAD (coronary artery disease) 04/21/2012  . Barrett esophagus 04/21/2012  . PUD (peptic ulcer disease) 04/21/2012  . Adrenal insufficiency 04/21/2012  . Raynaud's disease 04/21/2012  . Anxiety 04/21/2012  . Fibromyalgia 04/21/2012  . Hx of bladder  cancer 04/21/2012  . Edema of lower extremity 04/21/2012  . Falls 04/21/2012    Past Medical History  Diagnosis Date  . MI, old   . Parkinson disease   . Scoliosis   . Arthritis   . Bladder cancer   . HTN (hypertension) 04/21/2012  . Hyperlipidemia 04/21/2012  . DM (diabetes mellitus) 04/21/2012  . CAD (coronary artery disease) 04/21/2012  . Barrett esophagus 04/21/2012  . PUD (peptic ulcer disease) 04/21/2012  . Adrenal insufficiency 04/21/2012  . Raynaud's disease 04/21/2012  . Anxiety 04/21/2012  . Fibromyalgia 04/21/2012  . Hx of bladder cancer 04/21/2012  . Edema of lower extremity 04/21/2012    Chronic lower extremity edema  . Falls 04/21/2012  . Recurrent UTI 01/20/2013    Past Surgical History  Procedure Laterality Date  . Tonsillectomy    . Appendectomy    . Abdominal hysterectomy      History  Substance Use Topics  . Smoking status: Former Smoker    Types: Cigarettes  . Smokeless tobacco: Not on file  . Alcohol Use: No    Family History  Problem Relation Age of Onset  . Heart disease Mother   . Diabetes Mother   . Bladder Cancer Maternal Aunt     Allergies  Allergen Reactions  . Mirtazapine   . Codeine Hives    Medication list has been reviewed and updated.  Current Outpatient Prescriptions on File Prior to  Visit  Medication Sig Dispense Refill  . amantadine (SYMMETREL) 100 MG capsule Take 1 capsule (100 mg total) by mouth daily.  90 capsule  3  . amLODipine (NORVASC) 10 MG tablet Take 10 mg by mouth every evening.      Marland Kitchen amoxicillin-clavulanate (AUGMENTIN) 500-125 MG per tablet Take 1 tablet (500 mg total) by mouth 3 (three) times daily.  14 tablet  0  . aspirin 81 MG tablet Take 81 mg by mouth daily.      . carbidopa-levodopa (SINEMET IR) 25-100 MG per tablet Take 1 tablet by mouth 2 (two) times daily. Take at 9 AM and 9 PM  180 tablet  3  . clonazePAM (KLONOPIN) 0.5 MG tablet Take 0.5 mg by mouth once.      . famotidine (PEPCID) 20 MG tablet Take 1  tablet (20 mg total) by mouth 2 (two) times daily.  60 tablet  0  . feeding supplement (ENSURE COMPLETE) LIQD Take 237 mLs by mouth daily at 3 pm.      . furosemide (LASIX) 40 MG tablet Take 40 mg by mouth daily.      Marland Kitchen guaiFENesin (MUCINEX) 600 MG 12 hr tablet Take 1 tablet (600 mg total) by mouth 2 (two) times daily.  10 tablet  0  . meloxicam (MOBIC) 7.5 MG tablet Take 1 tablet (7.5 mg total) by mouth daily.  30 tablet  0  . mirabegron ER (MYRBETRIQ) 50 MG TB24 Take 50 mg by mouth daily.       . Multiple Vitamins-Minerals (OCUVITE PO) Take 1 tablet by mouth every evening.      . Omega-3 Fatty Acids (FISH OIL) 1000 MG CAPS Take 1,000 mg by mouth every evening.      Marland Kitchen omeprazole (PRILOSEC) 10 MG capsule Take 5 mg by mouth daily.      Marland Kitchen OVER THE COUNTER MEDICATION Place 2 drops into both eyes daily as needed. Lubricant eye drops for dry eyes      . pantoprazole (PROTONIX) 40 MG tablet Take 1 tablet (40 mg total) by mouth 2 (two) times daily.  60 tablet  0  . Pramipexole Dihydrochloride (MIRAPEX ER) 0.375 MG TB24 Take 0.375 tablets (0.1406 mg total) by mouth every evening.  90 tablet  3  . temazepam (RESTORIL) 30 MG capsule Take 30 mg by mouth at bedtime.      . traMADol-acetaminophen (ULTRACET) 37.5-325 MG per tablet Take 1 tablet by mouth 2 (two) times daily as needed. For pain       No current facility-administered medications on file prior to visit.    Review of Systems:  As per HPI- otherwise negative.   Physical Examination: Filed Vitals:   03/14/13 1509  BP: 108/66  Pulse: 56  Temp: 98 F (36.7 C)  Resp: 17   Filed Vitals:   03/14/13 1509  Height: 4\' 9"  (1.448 m)  Weight: 104 lb (47.174 kg)   Body mass index is 22.5 kg/(m^2). Ideal Body Weight: Weight in (lb) to have BMI = 25: 115.3  GEN: WDWN, NAD, Non-toxic, A & O x 3, sitting in wheelchair   HEENT: Atraumatic, Normocephalic. Neck supple. No masses, No LAD. Ears and Nose: No external deformity. CV: RRR, No M/G/R. No  JVD. No thrill. No extra heart sounds. PULM: CTA B, no wheezes, crackles, rhonchi. No retractions. No resp. distress. No accessory muscle use. ABD: S, NT, ND. No rebound. No HSM. She has severe scoliosis and her left hip nearly touches her left lower  ribs.  EXTR: No c/c/e NEURO Normal gait.  PSYCH: Normally interactive. Conversant. Not depressed or anxious appearing.  Calm demeanor.  Left hand: bruised and sore along the 5th MC.  Wrist is negative She is tender along the lateral left ribs.  No deformity or bruising noted.    [facility] reading (PRIMARY) by  Dr. Patsy Lager. Left hand: non- displaced fracture of proximal 5th MC.   Left ribs: she has a non- displaced fractures of the 7th and 8th ribs  Clinical Data: Fall. Left anterior rib pain.  CHEST - 2 VIEW  Comparison: 12/31/2012  Findings: There are rib fractures noted involving the left seventh through tenth ribs, seen best on today's rib series. No pneumothorax. There is bibasilar atelectasis and low lung volumes. No effusions.  IMPRESSION: Left seventh through tenth rib fractures. No pneumothorax.  LEFT RIBS - 2 VIEW  Comparison: None  Findings: There are a minimally-displaced fractures through the left seventh through tenth rib. No pneumothorax on the left. Left base atelectasis present. Heart is mildly enlarged.  IMPRESSION: Left seventh through tenth rib fractures.  LEFT HAND - COMPLETE 3+ VIEW  Comparison: 09/04/2011  Findings: Three views of the left hand submitted. There is diffuse osteopenia. Nondisplaced fracture proximal aspect fifth metacarpal. Stable chronic deformity of distal radius.  IMPRESSION: Nondisplaced fracture proximal aspect fifth metacarpal. Diffuse osteopenia.   She has been seen at [facility] ortho in the past-  Assessment and Plan: Rib pain on left side - Plan: DG Ribs Unilateral Left, DG Chest 2 View  Hand pain, left - Plan: DG Hand Complete Left  Eczema - Plan: triamcinolone cream  (KENALOG) 0.1 %  Left 5th MC fracture. Placed in an ulnar gutter splint. Plan for orthopedics follow-up.   Received her CXR over- read report.  She actually has 4 rib fractures. Called to go over this information.  As she is now 24 hours out from her accident and has suffered no other ill effects we hope that she will continue to do well.  However, cautioned them to be aware of any changes or worsening in her condition, especially of any abdominal pain or SOB.  Will work on finding her an Facilities manager and on ortho referral.    Signed Abbe Amsterdam, MD

## 2013-04-21 ENCOUNTER — Ambulatory Visit: Payer: Medicare Other | Admitting: Neurology

## 2013-05-04 ENCOUNTER — Ambulatory Visit (INDEPENDENT_AMBULATORY_CARE_PROVIDER_SITE_OTHER): Payer: Medicare Other | Admitting: Neurology

## 2013-05-04 ENCOUNTER — Encounter: Payer: Self-pay | Admitting: Neurology

## 2013-05-04 VITALS — BP 162/75 | HR 65 | Ht <= 58 in | Wt 97.0 lb

## 2013-05-04 DIAGNOSIS — G20 Parkinson's disease: Secondary | ICD-10-CM

## 2013-05-04 MED ORDER — CARBIDOPA-LEVODOPA 25-100 MG PO TABS: 1.0000 | ORAL_TABLET | Freq: Three times a day (TID) | ORAL | Status: DC

## 2013-05-04 NOTE — Patient Instructions (Addendum)
I do want to suggest a few things today:  Remember to drink plenty of fluid, eat healthy meals and do not skip any meals. Try to eat protein with a every meal and eat a healthy snack such as fruit or nuts in between meals. Try to keep a regular sleep-wake schedule and try to exercise daily, particularly in the form of walking, 20-30 minutes a day, if you can.   Engage in social activities in your community and with your family and try to keep up with current events by reading the newspaper or watching the news.   As far as your medications are concerned, I would like to suggest taking sinemet three times a day: 10 am, 2 pm, 6 pm. The amantadine and Mirapex will stay the same.    You CANNOT walk without your walker! Your are at severe FALL RISK!  Please see if you can have your maid come once a week, so you don't have to do dusting or mopping around the house.   I would like to see you back in 3 months, sooner if we need to. Please call us with any interim questions, concerns, problems, updates or refill requests.  Brett Canales is my clinical assistant and will answer any of your questions and relay your messages to me and also relay most of my messages to you.  Our phone number is [phone]. We also have an after hours call service for urgent matters and there is a physician on-call for urgent questions. For any emergencies you know to call 911 or go to the nearest emergency room.

## 2013-05-04 NOTE — Progress Notes (Signed)
Subjective:    Patient ID: [name] is a 79 y.o. female.  HPI  Interim history:   [name] is a very pleasant 79 year old right-handed woman who presents for followup consultation of her Parkinson's disease. She is accompanied by her husband again today. I first met them on 01/20/2013 and she previously followed with Dr. Avie Echevaria for about 12 years. At the time of our first visit I suggested stopping the selegiline said she did not notice much in the way of improvement in her freezing after restarting it. I increased her Sinemet to one whole pill twice daily and continued her pramipexole and amantadine. She has an underlying medical history of spinal stenosis, severe scoliosis, heart disease with MI, arthritis, bladder cancer, hypertension, hyperlipidemia, diabetes, peptic ulcer disease, adrenal insufficiency, anxiety, fibromyalgia, recurrent UTIs and recurrent falls. She is status post tonsillectomy, appendectomy and abdominal hysterectomy. She has had more freezing, some intermittent confusion, more fatigue, more daytime sleepiness, per husband. After taking the selegiline off, she may have had more freezing. She has had recurrent falls. She broke her L wrist and had a cast for 6 weeks. She has been falling repeatedly. She hurt her ribs.   Her Past Medical History Is Significant For: Past Medical History  Diagnosis Date  . MI, old   . Parkinson disease   . Scoliosis   . Arthritis   . Bladder cancer   . HTN (hypertension) 04/21/2012  . Hyperlipidemia 04/21/2012  . DM (diabetes mellitus) 04/21/2012  . CAD (coronary artery disease) 04/21/2012  . Barrett esophagus 04/21/2012  . PUD (peptic ulcer disease) 04/21/2012  . Adrenal insufficiency 04/21/2012  . Raynaud's disease 04/21/2012  . Anxiety 04/21/2012  . Fibromyalgia 04/21/2012  . Hx of bladder cancer 04/21/2012  . Edema of lower extremity 04/21/2012    Chronic lower extremity edema  . Falls 04/21/2012  . Recurrent UTI 01/20/2013     Her Past Surgical History Is Significant For: Past Surgical History  Procedure Laterality Date  . Tonsillectomy    . Appendectomy    . Abdominal hysterectomy      Her Family History Is Significant For: Family History  Problem Relation Age of Onset  . Heart disease Mother   . Diabetes Mother   . Bladder Cancer Maternal Aunt     Her Social History Is Significant For: History   Social History  . Marital Status: Married    Spouse Name: N/A    Number of Children: N/A  . Years of Education: N/A   Social History Main Topics  . Smoking status: Former Smoker    Types: Cigarettes  . Smokeless tobacco: None  . Alcohol Use: No  . Drug Use: No  . Sexually Active: No   Other Topics Concern  . None   Social History Narrative  . None    Her Allergies Are:  Allergies  Allergen Reactions  . Mirtazapine   . Codeine Hives  :   Her Current Medications Are:  Outpatient Encounter Prescriptions as of 05/04/2013  Medication Sig Dispense Refill  . amantadine (SYMMETREL) 100 MG capsule Take 1 capsule (100 mg total) by mouth daily.  90 capsule  3  . amLODipine (NORVASC) 10 MG tablet Take 10 mg by mouth every evening.      Marland Kitchen aspirin 81 MG tablet Take 81 mg by mouth daily.      . carbidopa-levodopa (SINEMET IR) 25-100 MG per tablet Take 1 tablet by mouth 2 (two) times daily. Take at 9 AM  and 9 PM  180 tablet  3  . famotidine (PEPCID) 20 MG tablet Take 1 tablet (20 mg total) by mouth 2 (two) times daily.  60 tablet  0  . feeding supplement (ENSURE COMPLETE) LIQD Take 237 mLs by mouth daily at 3 pm.      . furosemide (LASIX) 40 MG tablet Take 40 mg by mouth daily.      . Multiple Vitamins-Minerals (OCUVITE PO) Take 1 tablet by mouth every evening.      . Omega-3 Fatty Acids (FISH OIL) 1000 MG CAPS Take 1,000 mg by mouth every evening.      Marland Kitchen OVER THE COUNTER MEDICATION Place 2 drops into both eyes daily as needed. Lubricant eye drops for dry eyes      . pantoprazole (PROTONIX) 40 MG  tablet Take 1 tablet (40 mg total) by mouth 2 (two) times daily.  60 tablet  0  . temazepam (RESTORIL) 30 MG capsule Take 30 mg by mouth at bedtime.      . triamcinolone cream (KENALOG) 0.1 % Apply topically 2 (two) times daily.  30 g  1  . [DISCONTINUED] amoxicillin-clavulanate (AUGMENTIN) 500-125 MG per tablet Take 1 tablet (500 mg total) by mouth 3 (three) times daily.  14 tablet  0  . [DISCONTINUED] clonazePAM (KLONOPIN) 0.5 MG tablet Take 0.5 mg by mouth once.      . [DISCONTINUED] guaiFENesin (MUCINEX) 600 MG 12 hr tablet Take 1 tablet (600 mg total) by mouth 2 (two) times daily.  10 tablet  0  . [DISCONTINUED] meloxicam (MOBIC) 7.5 MG tablet Take 1 tablet (7.5 mg total) by mouth daily.  30 tablet  0  . [DISCONTINUED] mirabegron ER (MYRBETRIQ) 50 MG TB24 Take 50 mg by mouth daily.       . [DISCONTINUED] omeprazole (PRILOSEC) 10 MG capsule Take 5 mg by mouth daily.      . [DISCONTINUED] Pramipexole Dihydrochloride (MIRAPEX ER) 0.375 MG TB24 Take 0.375 tablets (0.1406 mg total) by mouth every evening.  90 tablet  3  . [DISCONTINUED] traMADol-acetaminophen (ULTRACET) 37.5-325 MG per tablet Take 1 tablet by mouth 2 (two) times daily as needed. For pain       No facility-administered encounter medications on file as of 05/04/2013.  : Review of Systems  Constitutional: Positive for fatigue and unexpected weight change.  HENT: Positive for hearing loss.   Eyes: Positive for visual disturbance.  Respiratory: Positive for cough.   Cardiovascular: Positive for leg swelling.  Gastrointestinal: Positive for constipation.  Endocrine: Positive for polydipsia.  Genitourinary:       Incontinence  Musculoskeletal: Positive for myalgias and arthralgias.  Hematological: Bruises/bleeds easily.    Objective:  Neurologic Exam  Physical Exam Physical Examination:   Filed Vitals:   05/04/13 1155  BP: 162/75  Pulse: 65   General Examination: The patient is a very pleasant 79 y.o. female in no acute  distress.  HEENT: Normocephalic, atraumatic, pupils are equal, round and reactive to light and accommodation. Extraocular tracking is good without nystagmus noted. She has a bruise around her left wrist. Gaze is conjugate. Normal smooth pursuit is noted. Hearing is grossly intact. Face is symmetric with mild facial masking noted. Speech is clear with no dysarthria noted. Voice is slightly soft. There is no lip, neck or jaw tremor. Neck is mildly rigid. There are no carotid bruits on auscultation. Oropharynx exam reveals normal findings. No significant airway crowding is noted. Mallampati is class II. Tongue protrudes centrally and palate elevates symmetrically.  Chest: is clear to auscultation without wheezing, rhonchi or crackles noted.  Heart: sounds are normal without murmurs, rubs or gallops noted.  Abdomen: is soft, non-tender and non-distended with normal bowel sounds appreciated on auscultation.  Extremities: There is no pitting edema in the distal lower extremities bilaterally. Pedal pulses are intact.  Skin: is warm and dry with no trophic changes noted.  Musculoskeletal: exam reveals significant scoliosis, uneven shoulder height.  Neurologically: Mental status: The patient is awake, alert and oriented in all 4 spheres. Her Memory, attention, language and knowledge are appropriate. There is no aphasia, agnosia, apraxia or anomia. Cranial nerves are as described above under HEENT exam.  Motor exam: Normal bulk, strength and tone is noted. There is no drift, tremor or rebound. Romberg is negative. Reflexes are 1+ throughout. Fine motor skills are moderately impaired with finger taps and hand movements  Bilaterally. She has moderate to severe impairment of foot taps on the right better on the left. She has a slight postural and action tremor on the right only. Cerebellar testing shows no dysmetria or intention tremor on finger to nose testing. There is no truncal or gait ataxia.  Sensory  exam is intact to light touch, pinprick, vibration, temperature sense. She needs some assistance to stand up from the seated position. She has a severely stooped posture and abnormal kyphoscoliosis. She walks with a rolling walker and maneuvers it fairly well. She has significant episodes of freezing but does have some start hesitation and has a tendency to fall with transferring. This is worse than last time.                    Assessment and Plan:  In summary, VASHTI BOLANOS is a very pleasant 79 y.o.-year old female with an over 12 year history of Parkinson's disease, with some recent progression and worsening gait and recurrent falls.  I had a long chat with the patient and her husband about my findings and the diagnosis, its prognosis and treatment options. We talked about medical treatments and non-pharmacological approaches. We talked about maintaining a healthy lifestyle in general. I encouraged the patient to eat healthy, exercise in the form of walking with her walker and keep well hydrated, to keep a scheduled bedtime and wake time routine, to not skip any meals and eat healthy snacks in between meals. Today we focused primarily on counseling and coordination of care. I explained to her at length that she is at significant fall risk and that she should not be walking without her walker. Her husband added that she is trying to mop the floors and dust furniture and sometimes she forgets to use her walker. She has recently fallen in the kitchen and has sustained injuries. I recommended the following at this time: I would like for her to take Sinemet 3 times a day namely at 10, 2 PM and 6 PM. She has currently been taking her first pill around noon and the last pill around bedtime which is close to 10 or 11 PM at times. I asked her to be more mindful of her medication schedule and we'll leave her amantadine to once daily in a.m. and Mirapex ER to once daily in p.m. Currently they have made come in twice  a month and I suggested perhaps having her come in once a week so the patient does not feel the need to dust and mop herself. I have encouraged them to call with any interim questions and would like  to see her back in about 3 months, sooner if the need arises. I adjusted her Sinemet prescription to reflect the 3 times a day dosing.

## 2013-05-25 ENCOUNTER — Ambulatory Visit: Payer: Medicare Other

## 2013-05-25 ENCOUNTER — Ambulatory Visit (INDEPENDENT_AMBULATORY_CARE_PROVIDER_SITE_OTHER): Payer: Medicare Other | Admitting: Family Medicine

## 2013-05-25 VITALS — BP 125/75 | HR 65 | Temp 97.5°F | Resp 17 | Ht <= 58 in | Wt 104.0 lb

## 2013-05-25 DIAGNOSIS — L89629 Pressure ulcer of left heel, unspecified stage: Secondary | ICD-10-CM

## 2013-05-25 DIAGNOSIS — L89609 Pressure ulcer of unspecified heel, unspecified stage: Secondary | ICD-10-CM

## 2013-05-25 MED ORDER — DOXYCYCLINE HYCLATE 100 MG PO TABS: 100.0000 mg | ORAL_TABLET | Freq: Two times a day (BID) | ORAL | Status: DC

## 2013-05-25 NOTE — Patient Instructions (Signed)
Keep your sound covered and clean.  We will work on arranging home health to help with your wound.

## 2013-05-25 NOTE — Progress Notes (Signed)
[facility] [address] [place] [address] [phone]  Date:  05/25/2013   Name:  [name]   DOB:  11-12-1927   MRN:  [id number]  PCP:  Allean Found, MD    Chief Complaint: sore place on left foot   History of Present Illness:  [name] is a 79 y.o. very pleasant female patient who presents with the following:  Here to evaluate a tender area on her left foot-  He family has noted a non- healing ulcer on the posterior heel for about 3 months.  It seems to have resulted from trauma.  Sometimes she will drag her foot back and forth repeatedly due to her Parkinson's disease.  The area has seemed more tender in the last several days.    Patient Active Problem List   Diagnosis Date Noted  . Recurrent UTI 01/20/2013  . Hiatal hernia 11/21/2012  . Dyspnea on exertion 11/21/2012  . Community acquired pneumonia 11/20/2012  . Depression 09/27/2012  . Memory loss 09/27/2012  . Gait disturbance 09/27/2012  . Candida UTI 09/27/2012  . Hypotension, Orthostatic 09/27/2012  . Confusion 09/27/2012  . Bradycardia 05/23/2012  . Constipation 04/25/2012  . Hypokalemia 04/22/2012  . Encephalopathy acute 04/21/2012  . Hypercalcemia 04/21/2012  . Renal failure (ARF), acute on chronic 04/21/2012  . Dehydration 04/21/2012  . HTN (hypertension) 04/21/2012  . Hyperlipidemia 04/21/2012  . DM (diabetes mellitus) 04/21/2012  . Parkinson's disease 04/21/2012  . CAD (coronary artery disease) 04/21/2012  . Barrett esophagus 04/21/2012  . PUD (peptic ulcer disease) 04/21/2012  . Adrenal insufficiency 04/21/2012  . Raynaud's disease 04/21/2012  . Anxiety 04/21/2012  . Fibromyalgia 04/21/2012  . Hx of bladder cancer 04/21/2012  . Edema of lower extremity 04/21/2012  . Falls 04/21/2012    Past Medical History  Diagnosis Date  . MI, old   . Parkinson disease   . Scoliosis   . Arthritis   . Bladder cancer   . HTN (hypertension) 04/21/2012  .  Hyperlipidemia 04/21/2012  . DM (diabetes mellitus) 04/21/2012  . CAD (coronary artery disease) 04/21/2012  . Barrett esophagus 04/21/2012  . PUD (peptic ulcer disease) 04/21/2012  . Adrenal insufficiency 04/21/2012  . Raynaud's disease 04/21/2012  . Anxiety 04/21/2012  . Fibromyalgia 04/21/2012  . Hx of bladder cancer 04/21/2012  . Edema of lower extremity 04/21/2012    Chronic lower extremity edema  . Falls 04/21/2012  . Recurrent UTI 01/20/2013    Past Surgical History  Procedure Laterality Date  . Tonsillectomy    . Appendectomy    . Abdominal hysterectomy      History  Substance Use Topics  . Smoking status: Former Smoker    Types: Cigarettes  . Smokeless tobacco: Not on file  . Alcohol Use: No    Family History  Problem Relation Age of Onset  . Heart disease Mother   . Diabetes Mother   . Bladder Cancer Maternal Aunt     Allergies  Allergen Reactions  . Mirtazapine   . Codeine Hives  . Latex     Medication list has been reviewed and updated.  Current Outpatient Prescriptions on File Prior to Visit  Medication Sig Dispense Refill  . amantadine (SYMMETREL) 100 MG capsule Take 1 capsule (100 mg total) by mouth daily.  90 capsule  3  . amLODipine (NORVASC) 10 MG tablet Take 10 mg by mouth every evening.      Marland Kitchen aspirin 81 MG tablet Take 81  mg by mouth daily.      . carbidopa-levodopa (SINEMET IR) 25-100 MG per tablet Take 1 tablet by mouth 3 (three) times daily. At 10 am, 2 pm and 6 pm  270 tablet  3  . famotidine (PEPCID) 20 MG tablet Take 1 tablet (20 mg total) by mouth 2 (two) times daily.  60 tablet  0  . feeding supplement (ENSURE COMPLETE) LIQD Take 237 mLs by mouth daily at 3 pm.      . furosemide (LASIX) 40 MG tablet Take 40 mg by mouth daily.      . Multiple Vitamins-Minerals (OCUVITE PO) Take 1 tablet by mouth every evening.      . Omega-3 Fatty Acids (FISH OIL) 1000 MG CAPS Take 1,000 mg by mouth every evening.      Marland Kitchen OVER THE COUNTER MEDICATION Place 2 drops  into both eyes daily as needed. Lubricant eye drops for dry eyes      . pantoprazole (PROTONIX) 40 MG tablet Take 1 tablet (40 mg total) by mouth 2 (two) times daily.  60 tablet  0  . temazepam (RESTORIL) 30 MG capsule Take 30 mg by mouth at bedtime.      . triamcinolone cream (KENALOG) 0.1 % Apply topically 2 (two) times daily.  30 g  1   No current facility-administered medications on file prior to visit.    Review of Systems:  As per HPI- otherwise negative. No fever noted.  Otherwise she feels well  Physical Examination: Filed Vitals:   05/25/13 1440  BP: 98/66  Pulse: 65  Temp: 97.5 F (36.4 C)  Resp: 17   Filed Vitals:   05/25/13 1440  Height: 4\' 4"  (1.321 m)  Weight: 104 lb (47.174 kg)   Body mass index is 27.03 kg/(m^2). Ideal Body Weight: Weight in (lb) to have BMI = 25: 95.9  GEN: WDWN, NAD, Non-toxic, A & O x 3, frail with severe kyphosis.  Accompanied by her husband  HEENT: Atraumatic, Normocephalic. Neck supple. No masses, No LAD. Ears and Nose: No external deformity. CV: RRR, No M/G/R. No JVD. No thrill. No extra heart sounds. PULM: CTA B, no wheezes, crackles, rhonchi. No retractions. No resp. distress. No accessory muscle use. EXTR: No c/c/e NEURO slow gait, uses a walker.   PSYCH: Normally interactive. Conversant. Not depressed or anxious appearing.  Calm demeanor.  Left heel: there is a dime- sized ulcer on the posterior heel.  It appears shallow and not infected.  It is slightly tender.   Otherwise her foot and leg are normal, with no other ulcers.    [facility] reading (PRIMARY) by  Dr. Patsy Lager. Left heel: negative.  Soft tissue ulcer on heel  *RADIOLOGY REPORT*  Clinical Data: Ulcer heel.  LEFT OS CALCIS - 2+ VIEW  Comparison: 09/04/2011.  Findings: Ulcer posterior to the left calcaneus with soft tissue prominence which may indicate cellulitis but without plain film evidence of osteomyelitis.  IMPRESSION: Ulcer posterior to the left calcaneus with  soft tissue prominence which may indicate cellulitis but without plain film evidence of osteomyelitis.  Clinically significant discrepancy from primary report, if provided: None  Dressed with a gauze bandage and padding to protect wound.  Referral to home health for further wound care.    Assessment and Plan: Pressure sore on heel, left, unspecified pressure ulcer stage - Plan: DG Os Calcis Left, Ambulatory referral to Home Health, doxycycline (VIBRA-TABS) 100 MG tablet  Wound dressed and placed in post- op shoe as above.  Home health referral.  At this time I do not feel the area is infected, but did give an rx for doxycycline to hold.  If not getting better with bandaging treatment in the next few days they may fill and use the antibiotic. Let me know if getting worse or if any other concerns.   Signed Abbe Amsterdam, MD

## 2013-05-30 NOTE — Telephone Encounter (Signed)
Done

## 2013-06-06 ENCOUNTER — Telehealth: Payer: Self-pay | Admitting: Neurology

## 2013-06-06 ENCOUNTER — Ambulatory Visit (INDEPENDENT_AMBULATORY_CARE_PROVIDER_SITE_OTHER): Payer: Medicare Other | Admitting: Family Medicine

## 2013-06-06 VITALS — BP 122/74 | HR 66 | Temp 98.2°F | Resp 18 | Ht <= 58 in | Wt 104.0 lb

## 2013-06-06 DIAGNOSIS — L97409 Non-pressure chronic ulcer of unspecified heel and midfoot with unspecified severity: Secondary | ICD-10-CM

## 2013-06-06 DIAGNOSIS — R410 Disorientation, unspecified: Secondary | ICD-10-CM

## 2013-06-06 DIAGNOSIS — N189 Chronic kidney disease, unspecified: Secondary | ICD-10-CM

## 2013-06-06 DIAGNOSIS — L97421 Non-pressure chronic ulcer of left heel and midfoot limited to breakdown of skin: Secondary | ICD-10-CM

## 2013-06-06 DIAGNOSIS — F23 Brief psychotic disorder: Secondary | ICD-10-CM

## 2013-06-06 LAB — POCT CBC
Hemoglobin: 11.3 g/dL — AB (ref 12.2–16.2)
Lymph, poc: 1.6 (ref 0.6–3.4)
MCH, POC: 29 pg (ref 27–31.2)
MCHC: 31.3 g/dL — AB (ref 31.8–35.4)
MCV: 92.6 fL (ref 80–97)
MID (cbc): 0.5 (ref 0–0.9)
MPV: 8.8 fL (ref 0–99.8)
POC MID %: 8.8 %M (ref 0–12)
Platelet Count, POC: 313 10*3/uL (ref 142–424)
WBC: 5.5 10*3/uL (ref 4.6–10.2)

## 2013-06-06 LAB — POCT URINALYSIS DIPSTICK
Bilirubin, UA: NEGATIVE
Glucose, UA: NEGATIVE
Ketones, UA: NEGATIVE
Spec Grav, UA: 1.01

## 2013-06-06 LAB — POCT UA - MICROSCOPIC ONLY: Mucus, UA: POSITIVE

## 2013-06-06 MED ORDER — CEPHALEXIN 500 MG PO CAPS: 500.0000 mg | ORAL_CAPSULE | Freq: Two times a day (BID) | ORAL | Status: DC

## 2013-06-06 NOTE — Patient Instructions (Addendum)
I will be in touch regarding the urine culture results.  In the meantime start taking the keflex antibiotic   [facility] are [facility]10/13 at 2:30 pm, [address], [place] [address]

## 2013-06-06 NOTE — Progress Notes (Addendum)
[facility] [address] [place] [address] [phone]  Date:  06/06/2013   Name:  [name]   DOB:  Oct 18, 1928   MRN:  [id number]  PCP:  Allean Found, MD    Chief Complaint: Foot Injury   History of Present Illness:  [name] is a 79 y.o. very pleasant female patient who presents with the following:  Here to recheck her left heel ulcer.  She did take doxycycline- a few times since she was last seen.  She has not been taking it regularly.  The ulcer seems to be about the same.   She has been sleeping more over the last few days, and has seemed a bit more confused/ more delusions.  She will often think that family members are present in their home or car when no one is there.  However, these sx are not new and have waxed and waned for some time.  She has not noted any particular urinary sx, but often a UTI has worsened her confusion in the past.  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  She is seen by neurology and they are working on getting in touch with her neurologist.    Creat clearance as per labs from 1/14- approx 23 ml/ min.  Ok to dose keflex every 8- 12 hours  Patient Active Problem List   Diagnosis Date Noted  . Recurrent UTI 01/20/2013  . Hiatal hernia 11/21/2012  . Dyspnea on exertion 11/21/2012  . Community acquired pneumonia 11/20/2012  . Depression 09/27/2012  . Memory loss 09/27/2012  . Gait disturbance 09/27/2012  . Candida UTI 09/27/2012  . Hypotension, Orthostatic 09/27/2012  . Confusion 09/27/2012  . Bradycardia 05/23/2012  . Constipation 04/25/2012  . Hypokalemia 04/22/2012  . Encephalopathy acute 04/21/2012  . Hypercalcemia 04/21/2012  . Renal failure (ARF), acute on chronic 04/21/2012  . Dehydration 04/21/2012  . HTN (hypertension) 04/21/2012  . Hyperlipidemia 04/21/2012  . DM (diabetes mellitus) 04/21/2012  . Parkinson's disease 04/21/2012  . CAD (coronary artery disease)  04/21/2012  . Barrett esophagus 04/21/2012  . PUD (peptic ulcer disease) 04/21/2012  . Adrenal insufficiency 04/21/2012  . Raynaud's disease 04/21/2012  . Anxiety 04/21/2012  . Fibromyalgia 04/21/2012  . Hx of bladder cancer 04/21/2012  . Edema of lower extremity 04/21/2012  . Falls 04/21/2012    Past Medical History  Diagnosis Date  . MI, old   . Parkinson disease   . Scoliosis   . Arthritis   . Bladder cancer   . HTN (hypertension) 04/21/2012  . Hyperlipidemia 04/21/2012  . DM (diabetes mellitus) 04/21/2012  . CAD (coronary artery disease) 04/21/2012  . Barrett esophagus 04/21/2012  . PUD (peptic ulcer disease) 04/21/2012  . Adrenal insufficiency 04/21/2012  . Raynaud's disease 04/21/2012  . Anxiety 04/21/2012  . Fibromyalgia 04/21/2012  . Hx of bladder cancer 04/21/2012  . Edema of lower extremity 04/21/2012    Chronic lower extremity edema  . Falls 04/21/2012  . Recurrent UTI 01/20/2013    Past Surgical History  Procedure Laterality Date  . Tonsillectomy    . Appendectomy    . Abdominal hysterectomy      History  Substance Use Topics  . Smoking status: Former Smoker    Types: Cigarettes  . Smokeless tobacco: Not on file  . Alcohol Use: No    Family History  Problem Relation Age of Onset  . Heart disease Mother   . Diabetes Mother   .  Bladder Cancer Maternal Aunt     Allergies  Allergen Reactions  . Mirtazapine   . Codeine Hives  . Latex     Medication list has been reviewed and updated.  Current Outpatient Prescriptions on File Prior to Visit  Medication Sig Dispense Refill  . amantadine (SYMMETREL) 100 MG capsule Take 1 capsule (100 mg total) by mouth daily.  90 capsule  3  . amLODipine (NORVASC) 10 MG tablet Take 10 mg by mouth every evening.      Marland Kitchen aspirin 81 MG tablet Take 81 mg by mouth daily.      . carbidopa-levodopa (SINEMET IR) 25-100 MG per tablet Take 1 tablet by mouth 3 (three) times daily. At 10 am, 2 pm and 6 pm  270 tablet  3  .  doxycycline (VIBRA-TABS) 100 MG tablet Take 1 tablet (100 mg total) by mouth 2 (two) times daily.  20 tablet  0  . famotidine (PEPCID) 20 MG tablet Take 1 tablet (20 mg total) by mouth 2 (two) times daily.  60 tablet  0  . feeding supplement (ENSURE COMPLETE) LIQD Take 237 mLs by mouth daily at 3 pm.      . furosemide (LASIX) 40 MG tablet Take 40 mg by mouth daily.      . Multiple Vitamins-Minerals (OCUVITE PO) Take 1 tablet by mouth every evening.      . Omega-3 Fatty Acids (FISH OIL) 1000 MG CAPS Take 1,000 mg by mouth every evening.      Marland Kitchen OVER THE COUNTER MEDICATION Place 2 drops into both eyes daily as needed. Lubricant eye drops for dry eyes      . pantoprazole (PROTONIX) 40 MG tablet Take 1 tablet (40 mg total) by mouth 2 (two) times daily.  60 tablet  0  . temazepam (RESTORIL) 30 MG capsule Take 30 mg by mouth at bedtime.      . triamcinolone cream (KENALOG) 0.1 % Apply topically 2 (two) times daily.  30 g  1   No current facility-administered medications on file prior to visit.    Review of Systems:  As per HPI- otherwise negative.   Physical Examination: Filed Vitals:   06/06/13 1227  BP: 122/74  Pulse: 66  Temp: 98.2 F (36.8 C)  Resp: 18   Filed Vitals:   06/06/13 1227  Height: 4\' 4"  (1.321 m)  Weight: 104 lb (47.174 kg)   Body mass index is 27.03 kg/(m^2). Ideal Body Weight: Weight in (lb) to have BMI = 25: 95.9  GEN: WDWN, NAD, Non-toxic, A & O x 3, frail and debilitated, severe spinal kyphosis.   HEENT: Atraumatic, Normocephalic. Neck supple. No masses, No LAD. Ears and Nose: No external deformity. CV: RRR, No M/G/R. No JVD. No thrill. No extra heart sounds. PULM: CTA B, no wheezes, crackles, rhonchi. No retractions. No resp. distress. No accessory muscle use. EXTR: No c/c/e NEURO Normal gait.  PSYCH: Normally interactive. Conversant. Not depressed or anxious appearing.  Calm demeanor.  Left heel: there is a small ulcer that appears to be 2 or 3 ml deep.   Does not appear infected, but also is not healing like we would like.     Results for orders placed in visit on 06/06/13  POCT URINALYSIS DIPSTICK      Result Value Range   Color, UA yellow     Clarity, UA clear     Glucose, UA neg     Bilirubin, UA neg     Ketones, UA neg  Spec Grav, UA 1.010     Blood, UA neg     pH, UA 6.0     Protein, UA neg     Urobilinogen, UA 0.2     Nitrite, UA neg     Leukocytes, UA Trace    POCT UA - MICROSCOPIC ONLY      Result Value Range   WBC, Ur, HPF, POC 7-8     RBC, urine, microscopic 3-4     Bacteria, U Microscopic trace     Mucus, UA pos     Epithelial cells, urine per micros 1-2     Crystals, Ur, HPF, POC neg     Casts, Ur, LPF, POC neg     Yeast, UA neg    POCT CBC      Result Value Range   WBC 5.5  4.6 - 10.2 K/uL   Lymph, poc 1.6  0.6 - 3.4   POC LYMPH PERCENT 28.5  10 - 50 %L   MID (cbc) 0.5  0 - 0.9   POC MID % 8.8  0 - 12 %M   POC Granulocyte 3.4  2 - 6.9   Granulocyte percent 62.7  37 - 80 %G   RBC 3.90 (*) 4.04 - 5.48 M/uL   Hemoglobin 11.3 (*) 12.2 - 16.2 g/dL   HCT, POC 16.1 (*) 09.6 - 47.9 %   MCV 92.6  80 - 97 fL   MCH, POC 29.0  27 - 31.2 pg   MCHC 31.3 (*) 31.8 - 35.4 g/dL   RDW, POC 04.5     Platelet Count, POC 313  142 - 424 K/uL   MPV 8.8  0 - 99.8 fL    Assessment and Plan: Confusion - Plan: POCT urinalysis dipstick, POCT UA - Microscopic Only, Urine culture, cephALEXin (KEFLEX) 500 MG capsule, POCT CBC, Basic metabolic panel  Heel ulcer, left, limited to breakdown of skin - Plan: POCT CBC, Basic metabolic panel  Possible UTI.  Will start on tx with keflex while we await her urine culture.   Referral to wound center to work on her heel ulcer.  Able to get appt this Friday.   Await other labs.  Let me know if any problems or worsening in the meantime   Signed Abbe Amsterdam, MD  06/07/13- received elevated BUN/ creat of 41 and 2.51.  Called [facility]- her PCP Dr. Hyman Hopes is out this week, so spoke  with his partner Dr. Darrick Penna.  Although her Creat is up some, it was nearly as high in March of this year and probably refects her hydration status.  At this time will have her hold her lasix, but do not think this level should affect her cognition.  Ordered a BMP, they will have this done in one week.  Await her urine culture. If any change or worsening please let me know   Creat clearance is still greater than 10- approx 11.7. Can continue keflex every 12 hours.

## 2013-06-07 LAB — BASIC METABOLIC PANEL
BUN: 41 mg/dL — ABNORMAL HIGH (ref 6–23)
CO2: 25 mEq/L (ref 19–32)
Calcium: 10.2 mg/dL (ref 8.4–10.5)
Chloride: 103 mEq/L (ref 96–112)
Creat: 2.51 mg/dL — ABNORMAL HIGH (ref 0.50–1.10)

## 2013-06-07 NOTE — Addendum Note (Signed)
Addended by: Abbe Amsterdam C on: 06/07/2013 04:06 PM   Modules accepted: Orders

## 2013-06-08 ENCOUNTER — Telehealth: Payer: Self-pay | Admitting: Family Medicine

## 2013-06-08 LAB — URINE CULTURE

## 2013-06-08 NOTE — Telephone Encounter (Signed)
Called and [facility]- urine culture positive for klebsiella.  Continue keflex, await sensitivity

## 2013-06-09 NOTE — Telephone Encounter (Signed)
Patient husband states there is a lot of mental confusion. The patient is hallucinating but her husband states he found out she had UTI on 06/08/2013. Patient husband states he wants to wait to see if her antibiotic works  before he takes any steps with changing any medications.

## 2013-06-10 ENCOUNTER — Encounter (HOSPITAL_BASED_OUTPATIENT_CLINIC_OR_DEPARTMENT_OTHER): Payer: Medicare Other | Attending: General Surgery

## 2013-06-10 DIAGNOSIS — L89609 Pressure ulcer of unspecified heel, unspecified stage: Secondary | ICD-10-CM | POA: Insufficient documentation

## 2013-06-10 DIAGNOSIS — E785 Hyperlipidemia, unspecified: Secondary | ICD-10-CM | POA: Insufficient documentation

## 2013-06-10 DIAGNOSIS — L8993 Pressure ulcer of unspecified site, stage 3: Secondary | ICD-10-CM | POA: Insufficient documentation

## 2013-06-10 DIAGNOSIS — I2510 Atherosclerotic heart disease of native coronary artery without angina pectoris: Secondary | ICD-10-CM | POA: Insufficient documentation

## 2013-06-10 DIAGNOSIS — I10 Essential (primary) hypertension: Secondary | ICD-10-CM | POA: Insufficient documentation

## 2013-06-10 DIAGNOSIS — F0280 Dementia in other diseases classified elsewhere without behavioral disturbance: Secondary | ICD-10-CM | POA: Insufficient documentation

## 2013-06-10 DIAGNOSIS — G3183 Dementia with Lewy bodies: Secondary | ICD-10-CM | POA: Insufficient documentation

## 2013-06-11 NOTE — H&P (Signed)
NAMEJAYDENCE, VANYO NO.:  [id number]  MEDICAL RECORD NO.:  [id number]  LOCATION:  FOOT                         FACILITY:  [facility]  PHYSICIAN:  Joanne Gavel, M.D.        DATE OF BIRTH:  10/19/1928  DATE OF ADMISSION:  06/10/2013 DATE OF DISCHARGE:                             HISTORY & PHYSICAL   CHIEF COMPLAINT:  Wound, left heel.  HISTORY OF PRESENT ILLNESS:  This is an 79 year old female with Parkinson disease and dementia, began complaining of tenderness in left heel approximately 2 months ago, but there was no wound evident until a week or so after that.  PAST MEDICAL HISTORY:  Parkinson disease, coronary artery disease, scoliosis, arthritis, bladder cancer, hypertension, hyperlipidemia, Barrett esophagus, adrenal insufficiency, renal insufficiency, Raynaud disease, recurrent UTIs, and hiatal hernia.  PAST SURGICAL HISTORY:  Tonsillectomy, appendectomy, hysterectomy, cataracts, and bladder cancer surgery several times.  SOCIAL HISTORY:  Cigarettes, she is a former smoker but not for years. Alcohol, none.  ALLERGIES:  MIRTAZAPINE, CODEINE, AND LATEX CAUSE HIVES.  MEDICATIONS:  Amantadine, amlodipine, Sinemet, doxycycline, Pepcid, Ensure, furosemide, several vitamins, pantoprazole, and Restoril.  REVIEW OF SYSTEMS:  Essentially as above.  PHYSICAL EXAMINATION:  VITAL SIGNS:  Temperature 97.7, pulse 57 and regular, respirations 16, blood pressure 150/78. GENERAL APPEARANCE:  Well developed, thin, in no distress.  She has marked scoliosis. CHEST:  Clear. HEART:  Regular rhythm. ABDOMEN:  Not examined. EXTREMITIES:  The patient has palpable pulse on the left side.  ABI is diminished at 0.95.  At the heel there is 1.0 x 0.5 x 0.1 wound.  After debriding callus.  The wound is quite small and the base is quite clean.  IMPRESSION:  Decubitus ulcer stage II, left heel in a nondiabetic patient.  We will start with floating the heels, silver alginate and heel  pad.  We will see her in 7 days.     Joanne Gavel, M.D.     RA/MEDQ  D:  06/10/2013  T:  06/11/2013  Job:  [id number]

## 2013-06-15 ENCOUNTER — Other Ambulatory Visit (INDEPENDENT_AMBULATORY_CARE_PROVIDER_SITE_OTHER): Payer: Medicare Other

## 2013-06-15 DIAGNOSIS — N189 Chronic kidney disease, unspecified: Secondary | ICD-10-CM

## 2013-06-16 ENCOUNTER — Other Ambulatory Visit: Payer: Medicare Other | Admitting: Family Medicine

## 2013-06-16 ENCOUNTER — Telehealth: Payer: Self-pay | Admitting: Family Medicine

## 2013-06-16 ENCOUNTER — Other Ambulatory Visit: Payer: Self-pay | Admitting: Radiology

## 2013-06-16 DIAGNOSIS — F05 Delirium due to known physiological condition: Secondary | ICD-10-CM

## 2013-06-16 LAB — BASIC METABOLIC PANEL
BUN: 34 mg/dL — ABNORMAL HIGH (ref 6–23)
Calcium: 9.9 mg/dL (ref 8.4–10.5)
Chloride: 107 mEq/L (ref 96–112)
Creat: 2.12 mg/dL — ABNORMAL HIGH (ref 0.50–1.10)

## 2013-06-16 NOTE — Telephone Encounter (Signed)
Called and spoke with Mr. Allshouse- her creatinine is improved. She continues to do poorly with frequent falls and confusion.  Offered to see her back at any time.  Will fax her most recent labs to her nephrologist as well.  Dr. Hyman Hopes

## 2013-06-16 NOTE — Progress Notes (Signed)
[name] is here today because [name] family is concerned that she still may have UTI sx.  She came in to try and give a urine sample today.  However, she was not able to provide a sample.  [name] feels that [name] current sx (increased confusion) are typical of past UTI.   As [name] was not able to provide a sample today will go ahead and start keflex for presumed UTI, and they will bring in a sample tomorrow to start culture.    Call in keflex 500 BID, 20 tablets to [name].

## 2013-06-17 ENCOUNTER — Other Ambulatory Visit (INDEPENDENT_AMBULATORY_CARE_PROVIDER_SITE_OTHER): Payer: Medicare Other | Admitting: Family Medicine

## 2013-06-17 DIAGNOSIS — N390 Urinary tract infection, site not specified: Secondary | ICD-10-CM

## 2013-06-17 DIAGNOSIS — F05 Delirium due to known physiological condition: Secondary | ICD-10-CM

## 2013-06-17 LAB — POCT URINALYSIS DIPSTICK
Bilirubin, UA: NEGATIVE
Blood, UA: NEGATIVE
Glucose, UA: NEGATIVE
Spec Grav, UA: 1.02
Urobilinogen, UA: 0.2
pH, UA: 6

## 2013-06-17 LAB — POCT UA - MICROSCOPIC ONLY: Crystals, Ur, HPF, POC: NEGATIVE

## 2013-06-20 ENCOUNTER — Ambulatory Visit (INDEPENDENT_AMBULATORY_CARE_PROVIDER_SITE_OTHER): Payer: Medicare Other | Admitting: Family Medicine

## 2013-06-20 VITALS — BP 122/74 | HR 62 | Temp 98.7°F | Resp 18

## 2013-06-20 DIAGNOSIS — R300 Dysuria: Secondary | ICD-10-CM

## 2013-06-20 DIAGNOSIS — R410 Disorientation, unspecified: Secondary | ICD-10-CM

## 2013-06-20 LAB — POCT UA - MICROSCOPIC ONLY: Mucus, UA: NEGATIVE

## 2013-06-20 LAB — COMPREHENSIVE METABOLIC PANEL
Albumin: 4.6 g/dL (ref 3.5–5.2)
BUN: 38 mg/dL — ABNORMAL HIGH (ref 6–23)
CO2: 23 mEq/L (ref 19–32)
Calcium: 9.7 mg/dL (ref 8.4–10.5)
Chloride: 108 mEq/L (ref 96–112)
Glucose, Bld: 111 mg/dL — ABNORMAL HIGH (ref 70–99)
Potassium: 4.8 mEq/L (ref 3.5–5.3)

## 2013-06-20 LAB — POCT URINALYSIS DIPSTICK
Blood, UA: NEGATIVE
Glucose, UA: NEGATIVE
Ketones, UA: NEGATIVE
Spec Grav, UA: 1.015

## 2013-06-20 NOTE — Progress Notes (Signed)
[facility] [address] [place] [address] [phone]  Date:  06/20/2013   Name:  [name]   DOB:  1928-10-13   MRN:  [id number]  PCP:  Allean Found, MD    Chief Complaint: Follow-up   History of Present Illness:  [name] is a 79 y.o. very pleasant female patient who presents with the following:  Here to recheck UA today.  She dropped off a urine on Friday- unfortunately I did not see this result and did not order a culture.  We had started her on keflex for presumed UTI on Thursday as she was not able to give a specimen that day.  Her husband notes that she continues to be confused and tired.  However, she does seem better tonight/ this am.  She is known to have parkinson's disease and related dementia as well- it is difficult to determine the source of her confusion.    Patient Active Problem List   Diagnosis Date Noted  . Recurrent UTI 01/20/2013  . Hiatal hernia 11/21/2012  . Dyspnea on exertion 11/21/2012  . Community acquired pneumonia 11/20/2012  . Depression 09/27/2012  . Memory loss 09/27/2012  . Gait disturbance 09/27/2012  . Candida UTI 09/27/2012  . Hypotension, Orthostatic 09/27/2012  . Confusion 09/27/2012  . Bradycardia 05/23/2012  . Constipation 04/25/2012  . Hypokalemia 04/22/2012  . Encephalopathy acute 04/21/2012  . Hypercalcemia 04/21/2012  . Renal failure (ARF), acute on chronic 04/21/2012  . Dehydration 04/21/2012  . HTN (hypertension) 04/21/2012  . Hyperlipidemia 04/21/2012  . DM (diabetes mellitus) 04/21/2012  . Parkinson's disease 04/21/2012  . CAD (coronary artery disease) 04/21/2012  . Barrett esophagus 04/21/2012  . PUD (peptic ulcer disease) 04/21/2012  . Adrenal insufficiency 04/21/2012  . Raynaud's disease 04/21/2012  . Anxiety 04/21/2012  . Fibromyalgia 04/21/2012  . Hx of bladder cancer 04/21/2012  . Edema of lower extremity 04/21/2012  . Falls 04/21/2012    Past Medical History   Diagnosis Date  . MI, old   . Parkinson disease   . Scoliosis   . Arthritis   . Bladder cancer   . HTN (hypertension) 04/21/2012  . Hyperlipidemia 04/21/2012  . DM (diabetes mellitus) 04/21/2012  . CAD (coronary artery disease) 04/21/2012  . Barrett esophagus 04/21/2012  . PUD (peptic ulcer disease) 04/21/2012  . Adrenal insufficiency 04/21/2012  . Raynaud's disease 04/21/2012  . Anxiety 04/21/2012  . Fibromyalgia 04/21/2012  . Hx of bladder cancer 04/21/2012  . Edema of lower extremity 04/21/2012    Chronic lower extremity edema  . Falls 04/21/2012  . Recurrent UTI 01/20/2013    Past Surgical History  Procedure Laterality Date  . Tonsillectomy    . Appendectomy    . Abdominal hysterectomy      History  Substance Use Topics  . Smoking status: Former Smoker    Types: Cigarettes  . Smokeless tobacco: Not on file  . Alcohol Use: No    Family History  Problem Relation Age of Onset  . Heart disease Mother   . Diabetes Mother   . Bladder Cancer Maternal Aunt     Allergies  Allergen Reactions  . Mirtazapine   . Codeine Hives  . Latex     Medication list has been reviewed and updated.  Current Outpatient Prescriptions on File Prior to Visit  Medication Sig Dispense Refill  . amantadine (SYMMETREL) 100 MG capsule Take 1 capsule (100 mg total) by mouth daily.  90 capsule  3  .  amLODipine (NORVASC) 10 MG tablet Take 10 mg by mouth every evening.      Marland Kitchen aspirin 81 MG tablet Take 81 mg by mouth daily.      . carbidopa-levodopa (SINEMET IR) 25-100 MG per tablet Take 1 tablet by mouth 3 (three) times daily. At 10 am, 2 pm and 6 pm  270 tablet  3  . cephALEXin (KEFLEX) 500 MG capsule Take 1 capsule (500 mg total) by mouth 2 (two) times daily.  14 capsule  0  . doxycycline (VIBRA-TABS) 100 MG tablet Take 1 tablet (100 mg total) by mouth 2 (two) times daily.  20 tablet  0  . famotidine (PEPCID) 20 MG tablet Take 1 tablet (20 mg total) by mouth 2 (two) times daily.  60 tablet  0  .  feeding supplement (ENSURE COMPLETE) LIQD Take 237 mLs by mouth daily at 3 pm.      . furosemide (LASIX) 40 MG tablet Take 40 mg by mouth daily.      . Multiple Vitamins-Minerals (OCUVITE PO) Take 1 tablet by mouth every evening.      . Omega-3 Fatty Acids (FISH OIL) 1000 MG CAPS Take 1,000 mg by mouth every evening.      Marland Kitchen OVER THE COUNTER MEDICATION Place 2 drops into both eyes daily as needed. Lubricant eye drops for dry eyes      . pantoprazole (PROTONIX) 40 MG tablet Take 1 tablet (40 mg total) by mouth 2 (two) times daily.  60 tablet  0  . temazepam (RESTORIL) 30 MG capsule Take 30 mg by mouth at bedtime.      . triamcinolone cream (KENALOG) 0.1 % Apply topically 2 (two) times daily.  30 g  1   No current facility-administered medications on file prior to visit.    Review of Systems:  As per HPI- otherwise negative.   Physical Examination: Filed Vitals:   06/20/13 1337  BP: 122/74  Pulse: 62  Temp: 98.7 F (37.1 C)  Resp: 18   There were no vitals filed for this visit. There is no weight on file to calculate BMI. Ideal Body Weight:    GEN: WDWN, NAD, Non-toxic, A & O x 3, kyphosis.   HEENT: Atraumatic, Normocephalic. Neck supple. No masses, No LAD. Ears and Nose: No external deformity. CV: RRR, No M/G/R. No JVD. No thrill. No extra heart sounds. PULM: CTA B, no wheezes, crackles, rhonchi. No retractions. No resp. distress. No accessory muscle use. ABD: S, NT, ND, +BS. No rebound. No HSM. EXTR: No c/c/e NEURO uses a walker.   PSYCH: Normally interactive. Conversant. Not depressed or anxious appearing.  Calm demeanor.  Sees very alert and well oriented today.     Results for orders placed in visit on 06/20/13  POCT URINALYSIS DIPSTICK      Result Value Range   Color, UA yellow     Clarity, UA clear     Glucose, UA neg     Bilirubin, UA neg     Ketones, UA neg     Spec Grav, UA 1.015     Blood, UA neg     pH, UA 6.0     Protein, UA neg     Urobilinogen, UA 0.2      Nitrite, UA neg     Leukocytes, UA Negative    POCT UA - MICROSCOPIC ONLY      Result Value Range   WBC, Ur, HPF, POC 0-1     RBC, urine, microscopic  0-1     Bacteria, U Microscopic neg     Mucus, UA neg     Epithelial cells, urine per micros 0-1     Crystals, Ur, HPF, POC neg     Casts, Ur, LPF, POC neg     Yeast, UA neg      Assessment and Plan: Dysuria - Plan: POCT urinalysis dipstick, POCT UA - Microscopic Only, Urine culture  Subacute confusional state - Plan: Comprehensive metabolic panel  Recent increase in confusion and concern regarding recurrent UTI.  She has been taking keflex for about 4 days, and seems better.  Urine looks better today. Await urine culture and CMP and will be in touch with them asap.   She is seeing wound care for her left heel ulcer, and is doing well. She will see them again on Friday.    Signed Abbe Amsterdam, MD

## 2013-06-23 ENCOUNTER — Encounter: Payer: Self-pay | Admitting: Family Medicine

## 2013-06-28 ENCOUNTER — Other Ambulatory Visit: Payer: Self-pay | Admitting: Family Medicine

## 2013-06-28 DIAGNOSIS — R5381 Other malaise: Secondary | ICD-10-CM

## 2013-07-04 ENCOUNTER — Ambulatory Visit (INDEPENDENT_AMBULATORY_CARE_PROVIDER_SITE_OTHER): Payer: Medicare Other | Admitting: Family Medicine

## 2013-07-04 VITALS — BP 130/72 | Temp 98.7°F | Resp 16

## 2013-07-04 DIAGNOSIS — K439 Ventral hernia without obstruction or gangrene: Secondary | ICD-10-CM

## 2013-07-04 DIAGNOSIS — K5900 Constipation, unspecified: Secondary | ICD-10-CM

## 2013-07-04 MED ORDER — SENNA 8.6 MG PO TABS: 1.0000 | ORAL_TABLET | Freq: Every day | ORAL | Status: DC | PRN

## 2013-07-04 MED ORDER — POLYETHYLENE GLYCOL 3350 17 GM/SCOOP PO POWD: 17.0000 g | Freq: Every day | ORAL | Status: DC

## 2013-07-04 NOTE — Patient Instructions (Addendum)
If you are laying down and can't get the lump to go down and/or it becomes red and tender, make sure you see a doctor immediately for evaluation.  Tell your physical therapist about your hernia, avoid lifting weight with your arms or doing exercises to strengthen your abdomen. Start taking miralax daily and senokot every other day.  If we can have you avoid straining with your bowel movements, hopefully this will prevent the hernia from getting to much worse over time.  Hernia A hernia occurs when an internal organ pushes out through a weak spot in the abdominal wall. Hernias most commonly occur in the groin and around the navel. Hernias often can be pushed back into place (reduced). Most hernias tend to get worse over time. Some abdominal hernias can get stuck in the opening (irreducible or incarcerated hernia) and cannot be reduced. An irreducible abdominal hernia which is tightly squeezed into the opening is at risk for impaired blood supply (strangulated hernia). A strangulated hernia is a medical emergency. Because of the risk for an irreducible or strangulated hernia, surgery may be recommended to repair a hernia. CAUSES   Heavy lifting.  Prolonged coughing.  Straining to have a bowel movement.  A cut (incision) made during an abdominal surgery. HOME CARE INSTRUCTIONS   Bed rest is not required. You may continue your normal activities.  Avoid lifting more than 10 pounds (4.5 kg) or straining.  Cough gently. If you are a smoker it is best to stop. Even the best hernia repair can break down with the continual strain of coughing. Even if you do not have your hernia repaired, a cough will continue to aggravate the problem.  Do not wear anything tight over your hernia. Do not try to keep it in with an outside bandage or truss. These can damage abdominal contents if they are trapped within the hernia sac.  Eat a normal diet.  Avoid constipation. Straining over long periods of time will  increase hernia size and encourage breakdown of repairs. If you cannot do this with diet alone, stool softeners may be used. SEEK IMMEDIATE MEDICAL CARE IF:   You have a fever.  You develop increasing abdominal pain.  You feel nauseous or vomit.  Your hernia is stuck outside the abdomen, looks discolored, feels hard, or is tender.  You have any changes in your bowel habits or in the hernia that are unusual for you.  You have increased pain or swelling around the hernia.  You cannot push the hernia back in place by applying gentle pressure while lying down. MAKE SURE YOU:   Understand these instructions.  Will watch your condition.  Will get help right away if you are not doing well or get worse. Document Released: 10/20/2005 Document Revised: 01/12/2012 Document Reviewed: 06/08/2008 [facility] Patient Information 2014 [place], [place].   Constipation, Adult Constipation is when a person has fewer than 3 bowel movements a week; has difficulty having a bowel movement; or has stools that are dry, hard, or larger than normal. As people grow older, constipation is more common. If you try to fix constipation with medicines that make you have a bowel movement (laxatives), the problem may get worse. Long-term laxative use may cause the muscles of the colon to become weak. A low-fiber diet, not taking in enough fluids, and taking certain medicines may make constipation worse. CAUSES   Certain medicines, such as antidepressants, pain medicine, iron supplements, antacids, and water pills.   Certain diseases, such as diabetes, irritable  bowel syndrome (IBS), thyroid disease, or depression.   Not drinking enough water.   Not eating enough fiber-rich foods.   Stress or travel.  Lack of physical activity or exercise.  Not going to the restroom when there is the urge to have a bowel movement.  Ignoring the urge to have a bowel movement.  Using laxatives too much. SYMPTOMS   Having  fewer than 3 bowel movements a week.   Straining to have a bowel movement.   Having hard, dry, or larger than normal stools.   Feeling full or bloated.   Pain in the lower abdomen.  Not feeling relief after having a bowel movement. DIAGNOSIS  Your caregiver will take a medical history and perform a physical exam. Further testing may be done for severe constipation. Some tests may include:   A barium enema X-ray to examine your rectum, colon, and sometimes, your small intestine.  A sigmoidoscopy to examine your lower colon.  A colonoscopy to examine your entire colon. TREATMENT  Treatment will depend on the severity of your constipation and what is causing it. Some dietary treatments include drinking more fluids and eating more fiber-rich foods. Lifestyle treatments may include regular exercise. If these diet and lifestyle recommendations do not help, your caregiver may recommend taking over-the-counter laxative medicines to help you have bowel movements. Prescription medicines may be prescribed if over-the-counter medicines do not work.  HOME CARE INSTRUCTIONS   Increase dietary fiber in your diet, such as fruits, vegetables, whole grains, and beans. Limit high-fat and processed sugars in your diet, such as [place] fries, hamburgers, cookies, candies, and soda.   A fiber supplement may be added to your diet if you cannot get enough fiber from foods.   Drink enough fluids to keep your urine clear or pale yellow.   Exercise regularly or as directed by your caregiver.   Go to the restroom when you have the urge to go. Do not hold it.  Only take medicines as directed by your caregiver. Do not take other medicines for constipation without talking to your caregiver first. SEEK IMMEDIATE MEDICAL CARE IF:   You have bright red blood in your stool.   Your constipation lasts for more than 4 days or gets worse.   You have abdominal or rectal pain.   You have thin, pencil-like  stools.  You have unexplained weight loss. MAKE SURE YOU:   Understand these instructions.  Will watch your condition.  Will get help right away if you are not doing well or get worse. Document Released: 07/18/2004 Document Revised: 01/12/2012 Document Reviewed: 09/23/2011 [facility] Patient Information 2014 [place], [place].

## 2013-07-04 NOTE — Progress Notes (Signed)
Subjective:    Patient ID: [name], female    DOB: Apr 02, 1928, 79 y.o.   MRN: [id number]   Chief Complaint  Patient presents with  . Mass    feels golf ball size mass in abdomen. x 2 days .   Marland Kitchen Constipation    HPI  Noticed lump in RLQ abd 2d ago, unchanged in size or location since that time. For sev mos has noticed sensation of movement in RLQ of abd - will last for about 3 min intermittently (relief for 3 mos).  Lump feels smaller when sitting down and is much more notable when standing up. Not painful.  Nml appetitie but does not eat much at baseline. No n/v.  Bowels intermittent between constipation and diarrhea - last BM was 2d ago.Marland KitchenHas been straining but no sig pain.  Not using any stool softeners or miralax - tried it along time ago and didn't work.  Nml urine. Tried smooth move tea but doesn't work for her.  Past Medical History  Diagnosis Date  . MI, old   . Parkinson disease   . Scoliosis   . Arthritis   . Bladder cancer   . HTN (hypertension) 04/21/2012  . Hyperlipidemia 04/21/2012  . DM (diabetes mellitus) 04/21/2012  . CAD (coronary artery disease) 04/21/2012  . Barrett esophagus 04/21/2012  . PUD (peptic ulcer disease) 04/21/2012  . Adrenal insufficiency 04/21/2012  . Raynaud's disease 04/21/2012  . Anxiety 04/21/2012  . Fibromyalgia 04/21/2012  . Hx of bladder cancer 04/21/2012  . Edema of lower extremity 04/21/2012    Chronic lower extremity edema  . Falls 04/21/2012  . Recurrent UTI 01/20/2013     Current Outpatient Prescriptions on File Prior to Visit  Medication Sig Dispense Refill  . amantadine (SYMMETREL) 100 MG capsule Take 1 capsule (100 mg total) by mouth daily.  90 capsule  3  . amLODipine (NORVASC) 10 MG tablet Take 10 mg by mouth every evening.      Marland Kitchen aspirin 81 MG tablet Take 81 mg by mouth daily.      . carbidopa-levodopa (SINEMET IR) 25-100 MG per tablet Take 1 tablet by mouth 3 (three) times daily. At 10 am, 2 pm and 6 pm  270 tablet  3  .  cephALEXin (KEFLEX) 500 MG capsule Take 1 capsule (500 mg total) by mouth 2 (two) times daily.  14 capsule  0  . doxycycline (VIBRA-TABS) 100 MG tablet Take 1 tablet (100 mg total) by mouth 2 (two) times daily.  20 tablet  0  . famotidine (PEPCID) 20 MG tablet Take 1 tablet (20 mg total) by mouth 2 (two) times daily.  60 tablet  0  . feeding supplement (ENSURE COMPLETE) LIQD Take 237 mLs by mouth daily at 3 pm.      . furosemide (LASIX) 40 MG tablet Take 40 mg by mouth daily.      . Multiple Vitamins-Minerals (OCUVITE PO) Take 1 tablet by mouth every evening.      . Omega-3 Fatty Acids (FISH OIL) 1000 MG CAPS Take 1,000 mg by mouth every evening.      Marland Kitchen OVER THE COUNTER MEDICATION Place 2 drops into both eyes daily as needed. Lubricant eye drops for dry eyes      . pantoprazole (PROTONIX) 40 MG tablet Take 1 tablet (40 mg total) by mouth 2 (two) times daily.  60 tablet  0  . temazepam (RESTORIL) 30 MG capsule Take 30 mg by mouth at bedtime.      Marland Kitchen  triamcinolone cream (KENALOG) 0.1 % Apply topically 2 (two) times daily.  30 g  1   No current facility-administered medications on file prior to visit.   Allergies  Allergen Reactions  . Mirtazapine   . Codeine Hives  . Latex      Review of Systems  Constitutional: Positive for unexpected weight change. Negative for fever, chills, activity change and appetite change.  Respiratory: Negative for shortness of breath.   Cardiovascular: Negative for chest pain and leg swelling.  Gastrointestinal: Positive for diarrhea, constipation and abdominal distention. Negative for nausea, vomiting, abdominal pain, blood in stool, anal bleeding and rectal pain.  Genitourinary: Negative for dysuria, hematuria, decreased urine volume and pelvic pain.  Musculoskeletal: Positive for gait problem.  Skin: Negative for color change and rash.      BP 130/72  Temp(Src) 98.7 F (37.1 C) (Oral)  Resp 16 Objective:   Physical Exam  Constitutional: She is oriented  to person, place, and time. She appears well-developed and well-nourished. No distress.  HENT:  Head: Normocephalic and atraumatic.  Right Ear: External ear normal.  Eyes: Conjunctivae are normal. No scleral icterus.  Pulmonary/Chest: Effort normal.  Abdominal: Soft. Normal appearance and bowel sounds are normal. She exhibits no distension and no pulsatile midline mass. There is no hepatosplenomegaly. There is no tenderness. A hernia is present. Hernia confirmed positive in the ventral area.    Golf-ball sized well defined soft reducible mass - most notable when standing.  Easily reducible without recurrence when laying flat, non-tender, no changes in overlying skin or warmth, bowel sounds present over mass.  Neurological: She is alert and oriented to person, place, and time.  Skin: Skin is warm and dry. She is not diaphoretic. No erythema.  Psychiatric: She has a normal mood and affect. Her behavior is normal.      Assessment & Plan:  Constipation  Abdominal wall hernia - discussed warning signs for herniation and incarceration to return to clinic with. Pt is a poor surgical candidate so elective repair at this point not an option.  Discussed ways to prevent worsening of hernia such as avoidance of straining through lifting, abd w/o during PT, and straining during BM.  H/o bladder cancer with hematuria - reassured that last UA did not have any rbcs and 1 prior did but more wbc due to +UTI.  No sxs currently so no need to recheck today.  Meds ordered this encounter  Medications  . senna (SENOKOT) 8.6 MG TABS tablet    Sig: Take 1 tablet (8.6 mg total) by mouth daily as needed.    Dispense:  120 each    Refill:  0  . polyethylene glycol powder (GLYCOLAX/MIRALAX) powder    Sig: Take 17 g by mouth daily.    Dispense:  527 g    Refill:  1

## 2013-07-08 ENCOUNTER — Encounter (HOSPITAL_BASED_OUTPATIENT_CLINIC_OR_DEPARTMENT_OTHER): Payer: Medicare Other | Attending: General Surgery

## 2013-07-08 DIAGNOSIS — L8993 Pressure ulcer of unspecified site, stage 3: Secondary | ICD-10-CM | POA: Insufficient documentation

## 2013-07-08 DIAGNOSIS — L89609 Pressure ulcer of unspecified heel, unspecified stage: Secondary | ICD-10-CM | POA: Insufficient documentation

## 2013-07-09 ENCOUNTER — Other Ambulatory Visit: Payer: Medicare Other | Admitting: Family Medicine

## 2013-07-09 ENCOUNTER — Other Ambulatory Visit (INDEPENDENT_AMBULATORY_CARE_PROVIDER_SITE_OTHER): Payer: Medicare Other

## 2013-07-09 DIAGNOSIS — F22 Delusional disorders: Secondary | ICD-10-CM

## 2013-07-09 DIAGNOSIS — G4710 Hypersomnia, unspecified: Secondary | ICD-10-CM

## 2013-07-09 DIAGNOSIS — R5381 Other malaise: Secondary | ICD-10-CM

## 2013-07-09 LAB — POCT UA - MICROSCOPIC ONLY: Bacteria, U Microscopic: NEGATIVE

## 2013-07-09 LAB — POCT URINALYSIS DIPSTICK
Bilirubin, UA: NEGATIVE
Blood, UA: NEGATIVE
Glucose, UA: NEGATIVE
Ketones, UA: NEGATIVE
Leukocytes, UA: NEGATIVE
Nitrite, UA: NEGATIVE
Protein, UA: NEGATIVE
Spec Grav, UA: 1.015
Urobilinogen, UA: 0.2
pH, UA: 6

## 2013-07-09 NOTE — Addendum Note (Signed)
Addended by: Johnnette Litter on: 07/09/2013 04:38 PM   Modules accepted: Orders

## 2013-07-11 LAB — URINE CULTURE

## 2013-07-12 ENCOUNTER — Telehealth: Payer: Self-pay

## 2013-07-12 ENCOUNTER — Other Ambulatory Visit (INDEPENDENT_AMBULATORY_CARE_PROVIDER_SITE_OTHER): Payer: Medicare Other | Admitting: Family Medicine

## 2013-07-12 DIAGNOSIS — R82998 Other abnormal findings in urine: Secondary | ICD-10-CM

## 2013-07-12 DIAGNOSIS — R8290 Unspecified abnormal findings in urine: Secondary | ICD-10-CM

## 2013-07-12 LAB — POCT UA - MICROSCOPIC ONLY
Casts, Ur, LPF, POC: NEGATIVE
Yeast, UA: NEGATIVE

## 2013-07-12 LAB — POCT URINALYSIS DIPSTICK
Blood, UA: NEGATIVE
Glucose, UA: NEGATIVE
Nitrite, UA: NEGATIVE
Protein, UA: NEGATIVE
Urobilinogen, UA: 0.2

## 2013-07-12 NOTE — Telephone Encounter (Signed)
See lab, pt is going to RTC for u/a. Future orders placed.

## 2013-07-13 ENCOUNTER — Encounter (INDEPENDENT_AMBULATORY_CARE_PROVIDER_SITE_OTHER): Payer: Self-pay | Admitting: General Surgery

## 2013-07-13 ENCOUNTER — Ambulatory Visit (INDEPENDENT_AMBULATORY_CARE_PROVIDER_SITE_OTHER): Payer: Medicare Other | Admitting: General Surgery

## 2013-07-13 VITALS — BP 122/74 | HR 68 | Resp 16 | Ht <= 58 in | Wt 100.8 lb

## 2013-07-13 DIAGNOSIS — K4090 Unilateral inguinal hernia, without obstruction or gangrene, not specified as recurrent: Secondary | ICD-10-CM

## 2013-07-13 LAB — URINE CULTURE: Colony Count: 6000

## 2013-07-13 NOTE — Progress Notes (Signed)
Patient ID: [name], female   DOB: Feb 10, 1928, 79 y.o.   MRN: [id number]  Chief Complaint  Patient presents with  . New Evaluation    eval ing hernia    HPI [name] is a 79 y.o. female.  We are asked to see the patient in consultation by Dr. Katrinka Blazing to evaluate her for a right inguinal hernia. The patient is an 79 year old white female who recently noticed a lump in her right groin. She denies any pain. She denies any nausea or vomiting. She does have chronic problems with constipation but this is not new.  HPI  Past Medical History  Diagnosis Date  . MI, old   . Parkinson disease   . Scoliosis   . Arthritis   . Bladder cancer   . HTN (hypertension) 04/21/2012  . Hyperlipidemia 04/21/2012  . DM (diabetes mellitus) 04/21/2012  . CAD (coronary artery disease) 04/21/2012  . Barrett esophagus 04/21/2012  . PUD (peptic ulcer disease) 04/21/2012  . Adrenal insufficiency 04/21/2012  . Raynaud's disease 04/21/2012  . Anxiety 04/21/2012  . Fibromyalgia 04/21/2012  . Hx of bladder cancer 04/21/2012  . Edema of lower extremity 04/21/2012    Chronic lower extremity edema  . Falls 04/21/2012  . Recurrent UTI 01/20/2013    Past Surgical History  Procedure Laterality Date  . Tonsillectomy    . Appendectomy    . Abdominal hysterectomy      Family History  Problem Relation Age of Onset  . Heart disease Mother   . Diabetes Mother   . Bladder Cancer Maternal Aunt     Social History History  Substance Use Topics  . Smoking status: Former Smoker    Types: Cigarettes  . Smokeless tobacco: Never Used  . Alcohol Use: No    Allergies  Allergen Reactions  . Mirtazapine   . Codeine Hives  . Latex     Current Outpatient Prescriptions  Medication Sig Dispense Refill  . amantadine (SYMMETREL) 100 MG capsule Take 1 capsule (100 mg total) by mouth daily.  90 capsule  3  . amLODipine (NORVASC) 10 MG tablet Take 10 mg by mouth every evening.      Marland Kitchen aspirin 81 MG tablet Take 81 mg by  mouth daily.      . carbidopa-levodopa (SINEMET IR) 25-100 MG per tablet Take 1 tablet by mouth 3 (three) times daily. At 10 am, 2 pm and 6 pm  270 tablet  3  . famotidine (PEPCID) 20 MG tablet Take 1 tablet (20 mg total) by mouth 2 (two) times daily.  60 tablet  0  . feeding supplement (ENSURE COMPLETE) LIQD Take 237 mLs by mouth daily at 3 pm.      . furosemide (LASIX) 40 MG tablet Take 40 mg by mouth daily.      . Multiple Vitamins-Minerals (OCUVITE PO) Take 1 tablet by mouth every evening.      . Omega-3 Fatty Acids (FISH OIL) 1000 MG CAPS Take 1,000 mg by mouth every evening.      . pantoprazole (PROTONIX) 40 MG tablet Take 1 tablet (40 mg total) by mouth 2 (two) times daily.  60 tablet  0  . polyethylene glycol powder (GLYCOLAX/MIRALAX) powder Take 17 g by mouth daily.  527 g  1  . temazepam (RESTORIL) 30 MG capsule Take 30 mg by mouth at bedtime.      . triamcinolone cream (KENALOG) 0.1 % Apply topically 2 (two) times daily.  30 g  1  .  OVER THE COUNTER MEDICATION Place 2 drops into both eyes daily as needed. Lubricant eye drops for dry eyes      . senna (SENOKOT) 8.6 MG TABS tablet Take 1 tablet (8.6 mg total) by mouth daily as needed.  120 each  0   No current facility-administered medications for this visit.    Review of Systems Review of Systems  Constitutional: Negative.   HENT: Negative.   Eyes: Negative.   Respiratory: Negative.   Cardiovascular: Negative.   Gastrointestinal: Positive for constipation. Negative for abdominal pain.  Endocrine: Negative.   Genitourinary: Negative.   Musculoskeletal: Positive for gait problem.  Skin: Negative.   Allergic/Immunologic: Negative.   Neurological: Positive for tremors.  Hematological: Negative.   Psychiatric/Behavioral: Negative.     Blood pressure 122/74, pulse 68, resp. rate 16, height 4\' 6"  (1.372 m), weight 100 lb 12.8 oz (45.723 kg).  Physical Exam Physical Exam  Constitutional: She is oriented to person, place, and  time.  A thin elderly white female  HENT:  Head: Normocephalic and atraumatic.  Eyes: Conjunctivae and EOM are normal. Pupils are equal, round, and reactive to light.  Neck: Normal range of motion. Neck supple.  Cardiovascular: Normal rate and regular rhythm.   Murmur heard. Pulmonary/Chest: Effort normal and breath sounds normal.  Abdominal: Soft. Bowel sounds are normal. There is no tenderness.  Genitourinary:  There is a moderate-sized bulge in the right groin that reduces easily. This is nontender.  Musculoskeletal:  She has tremors and scoliosis  Neurological: She is alert and oriented to person, place, and time.  Skin: Skin is warm and dry.  Psychiatric: She has a normal mood and affect. Her behavior is normal.    Data Reviewed As above  Assessment    The patient has a moderate-sized right inguinal hernia that is completely asymptomatic. Although there is a risk of incarceration and strangulation which I explained to her, given her significant number of medical problems I think it would be equally reasonable if this vein is not bothering her to leave it alone. We have talked about the different options for fixing her hernia in detail area. At this point she does not want to have anything done and I think this is a reasonable choice. She agrees to Call if she starts having any discomfort with it at all.     Plan    At this point we will plan to observe her. She will call us if there is any problems        TOTH III,PAUL S 07/13/2013, 4:36 PM

## 2013-07-13 NOTE — Patient Instructions (Signed)
Call if hernia bothers you

## 2013-08-06 ENCOUNTER — Other Ambulatory Visit (INDEPENDENT_AMBULATORY_CARE_PROVIDER_SITE_OTHER): Payer: Medicare Other | Admitting: Family Medicine

## 2013-08-06 DIAGNOSIS — N390 Urinary tract infection, site not specified: Secondary | ICD-10-CM

## 2013-08-06 DIAGNOSIS — K5900 Constipation, unspecified: Secondary | ICD-10-CM

## 2013-08-06 DIAGNOSIS — R300 Dysuria: Secondary | ICD-10-CM

## 2013-08-06 LAB — POCT URINALYSIS DIPSTICK
Protein, UA: NEGATIVE
Spec Grav, UA: 1.015
Urobilinogen, UA: 0.2
pH, UA: 6

## 2013-08-06 LAB — POCT UA - MICROSCOPIC ONLY: Yeast, UA: NEGATIVE

## 2013-08-06 MED ORDER — CEPHALEXIN 500 MG PO CAPS: 500.0000 mg | ORAL_CAPSULE | Freq: Two times a day (BID) | ORAL | Status: DC

## 2013-08-06 NOTE — Progress Notes (Signed)
Subjective:    Patient ID: [name], female    DOB: 07-Sep-1928, 79 y.o.   MRN: [id number] No chief complaint on file.   HPI  Here for quick check on whether [name] might have a UTI. She has had increasing sxs but many of these coincide with her Parkinson's - which has definitely worsened in the past yr so it can be difficult to distinguish.  She has had more gait imbalance and weakness. She has been sleeping much more than usual - will sleep 10-11 hrs then go back for another 3 hr nap. She has been hallucinating - seeing and talking to family members who are deceased. She has had more episodes of incontinence.  Past Medical History  Diagnosis Date  . MI, old   . Parkinson disease   . Scoliosis   . Arthritis   . Bladder cancer   . HTN (hypertension) 04/21/2012  . Hyperlipidemia 04/21/2012  . DM (diabetes mellitus) 04/21/2012  . CAD (coronary artery disease) 04/21/2012  . Barrett esophagus 04/21/2012  . PUD (peptic ulcer disease) 04/21/2012  . Adrenal insufficiency 04/21/2012  . Raynaud's disease 04/21/2012  . Anxiety 04/21/2012  . Fibromyalgia 04/21/2012  . Hx of bladder cancer 04/21/2012  . Edema of lower extremity 04/21/2012    Chronic lower extremity edema  . Falls 04/21/2012  . Recurrent UTI 01/20/2013   Current Outpatient Prescriptions on File Prior to Visit  Medication Sig Dispense Refill  . amantadine (SYMMETREL) 100 MG capsule Take 1 capsule (100 mg total) by mouth daily.  90 capsule  3  . amLODipine (NORVASC) 10 MG tablet Take 10 mg by mouth every evening.      Marland Kitchen aspirin 81 MG tablet Take 81 mg by mouth daily.      . carbidopa-levodopa (SINEMET IR) 25-100 MG per tablet Take 1 tablet by mouth 3 (three) times daily. At 10 am, 2 pm and 6 pm  270 tablet  3  . famotidine (PEPCID) 20 MG tablet Take 1 tablet (20 mg total) by mouth 2 (two) times daily.  60 tablet  0  . feeding supplement (ENSURE COMPLETE) LIQD Take 237 mLs by mouth daily at 3 pm.      . furosemide (LASIX) 40 MG  tablet Take 40 mg by mouth daily.      . Multiple Vitamins-Minerals (OCUVITE PO) Take 1 tablet by mouth every evening.      . Omega-3 Fatty Acids (FISH OIL) 1000 MG CAPS Take 1,000 mg by mouth every evening.      Marland Kitchen OVER THE COUNTER MEDICATION Place 2 drops into both eyes daily as needed. Lubricant eye drops for dry eyes      . pantoprazole (PROTONIX) 40 MG tablet Take 1 tablet (40 mg total) by mouth 2 (two) times daily.  60 tablet  0  . polyethylene glycol powder (GLYCOLAX/MIRALAX) powder Take 17 g by mouth daily.  527 g  1  . senna (SENOKOT) 8.6 MG TABS tablet Take 1 tablet (8.6 mg total) by mouth daily as needed.  120 each  0  . temazepam (RESTORIL) 30 MG capsule Take 30 mg by mouth at bedtime.      . triamcinolone cream (KENALOG) 0.1 % Apply topically 2 (two) times daily.  30 g  1   No current facility-administered medications on file prior to visit.   Allergies  Allergen Reactions  . Mirtazapine   . Codeine Hives  . Latex      Review of Systems  Constitutional:  Negative for fever, chills, diaphoresis, activity change, appetite change and fatigue.  Gastrointestinal: Positive for abdominal pain, diarrhea and constipation. Negative for blood in stool, anal bleeding and rectal pain.  Genitourinary: Positive for urgency, enuresis and pelvic pain. Negative for dysuria, hematuria, decreased urine volume, difficulty urinating and genital sores.  Musculoskeletal: Positive for gait problem.  Skin: Negative for rash.  Neurological: Positive for weakness.  Hematological: Negative for adenopathy.  Psychiatric/Behavioral: Positive for hallucinations. The patient is not nervous/anxious.        Sleeping MUCH more      There were no vitals taken for this visit.  Objective:   Physical Exam  Constitutional: She is oriented to person, place, and time. She appears well-developed and well-nourished. No distress.  HENT:  Head: Normocephalic and atraumatic.  Right Ear: External ear normal.  Eyes:  Conjunctivae are normal. No scleral icterus.  Pulmonary/Chest: Effort normal.  Abdominal: There is tenderness in the suprapubic area.  Neurological: She is alert and oriented to person, place, and time.  Skin: Skin is warm and dry. She is not diaphoretic. No erythema.  Psychiatric: She has a normal mood and affect. Her behavior is normal.          Results for orders placed in visit on 08/06/13  POCT URINALYSIS DIPSTICK      Result Value Range   Color, UA yellow     Clarity, UA clear     Glucose, UA neg     Bilirubin, UA neg     Ketones, UA neg     Spec Grav, UA 1.015     Blood, UA neg     pH, UA 6.0     Protein, UA neg     Urobilinogen, UA 0.2     Nitrite, UA neg     Leukocytes, UA Negative    POCT UA - MICROSCOPIC ONLY      Result Value Range   WBC, Ur, HPF, POC 1-2     RBC, urine, microscopic 7-8     Bacteria, U Microscopic 1+     Mucus, UA pos     Epithelial cells, urine per micros 0-1     Crystals, Ur, HPF, POC neg     Casts, Ur, LPF, POC hyaline     Yeast, UA neg      Assessment & Plan:  Dysuria - Plan: POCT urinalysis dipstick, POCT UA - Microscopic Only, Urine culture  Urinary tract infection, site not specified - Plan: cephALEXin (KEFLEX) 500 MG capsule - UA looks negative but pt has certainly had a lot of sxs so will go ahead and treat while culture is pending.  Unspecified constipation - advised ok to cont benefiber and senna daily then change to senokot S if constipation worsening and hold off on senna during episodes of diarrhea.

## 2013-08-06 NOTE — Patient Instructions (Addendum)
Continue on the benefiber daily.  It is ok to take the senna in addition as needed - this is a laxative so will make your bowels more more quickly but can also cause some cramping. It is good to take a stool softener in addition to the benefiber if you are having hard stools - this is like colace or docusate - it is fine to take this regularly but you can stop this temporarily if you develop diarrhea.

## 2013-08-07 LAB — URINE CULTURE: Colony Count: 8000

## 2013-08-10 ENCOUNTER — Ambulatory Visit: Payer: Medicare Other | Admitting: Neurology

## 2013-08-25 ENCOUNTER — Encounter: Payer: Self-pay | Admitting: Neurology

## 2013-08-25 ENCOUNTER — Ambulatory Visit (INDEPENDENT_AMBULATORY_CARE_PROVIDER_SITE_OTHER): Payer: Medicare Other | Admitting: Neurology

## 2013-08-25 VITALS — BP 141/73 | HR 60 | Ht <= 58 in | Wt 106.0 lb

## 2013-08-25 DIAGNOSIS — R296 Repeated falls: Secondary | ICD-10-CM

## 2013-08-25 DIAGNOSIS — Z9181 History of falling: Secondary | ICD-10-CM

## 2013-08-25 DIAGNOSIS — F329 Major depressive disorder, single episode, unspecified: Secondary | ICD-10-CM

## 2013-08-25 DIAGNOSIS — G20 Parkinson's disease: Secondary | ICD-10-CM

## 2013-08-25 DIAGNOSIS — R269 Unspecified abnormalities of gait and mobility: Secondary | ICD-10-CM

## 2013-08-25 DIAGNOSIS — W19XXXD Unspecified fall, subsequent encounter: Secondary | ICD-10-CM

## 2013-08-25 DIAGNOSIS — R011 Cardiac murmur, unspecified: Secondary | ICD-10-CM

## 2013-08-25 DIAGNOSIS — R609 Edema, unspecified: Secondary | ICD-10-CM

## 2013-08-25 DIAGNOSIS — W19XXXA Unspecified fall, initial encounter: Secondary | ICD-10-CM

## 2013-08-25 DIAGNOSIS — I5032 Chronic diastolic (congestive) heart failure: Secondary | ICD-10-CM

## 2013-08-25 DIAGNOSIS — R413 Other amnesia: Secondary | ICD-10-CM

## 2013-08-25 NOTE — Patient Instructions (Addendum)
I think your Parkinson's disease has remained fairly stable, which is reassuring. Nevertheless, as you know, this disease does progress with time. It can affect your balance, your memory, your mood, your bowel and bladder function, your posture, balance and walking. Your gait disorder has indeed has become worse and you have fallen repeatedly.    I do want to suggest a few things today:  You do need to use your walker at all times! Do not multitask!   Remember to drink plenty of fluid, eat healthy meals and do not skip any meals. Try to eat protein with a every meal and eat a healthy snack such as fruit or nuts in between meals. Try to keep a regular sleep-wake schedule and try to exercise daily, particularly in the form of walking, 20-30 minutes a day, if you can.   Taking your medication on schedule is key. Take benefiber, with plenty of water.   Try to stay active physically and mentally. Engage in social activities in your community and with your family and try to keep up with current events by reading the newspaper or watching the news. Try to do word puzzles and you may like to do word puzzles and brain games on the computer such as on [email or web address]   As far as your medications are concerned, I would like to suggest that you take your current medication with the following additional changes: Take Sinemet 1 pill 3 times a day, 10 AM, 2 PM, 6 PM.   As far as diagnostic testing, I will order: no new test.  I would like to see you back in 3 months, sooner if we need to. Please call us with any interim questions, concerns, problems, updates or refill requests.  Please also call your cardiologist for an appointment. Brett Canales is my clinical assistant and will answer any of your questions and relay your messages to me and also relay most of my messages to you.  Our phone number is [phone]. We also have an after hours call service for urgent matters and there is a physician on-call for urgent  questions, that cannot wait till the next work day. For any emergencies you know to call 911 or go to the nearest emergency room.

## 2013-08-25 NOTE — Progress Notes (Signed)
Subjective:    Patient ID: [name] is a 79 y.o. female.  HPI  Interim history:   [name] is a very pleasant 79 year old right-handed woman who presents for followup consultation of her Parkinson's disease, complicated by recurrent falls, depression, and memory loss. She is accompanied by her husband again today. I last saw her on 05/04/2013, at which time I advised her to take Sinemet 3 times a day, namely at 10 AM, 2 PM and 6 PM. I kept her amantadine dose the same as well as her long acting Mirapex once daily. I also talked to them about getting additional help for day-to-day chores at the house. She had CP last night and they called EMT, but they did not think it was the heart. She has FMS for years. Unfortunately, she has not been taking her C/L 3 times a day and takes it only 2 times a day around noon and in the evening.  She has fallen several times since the last visit, but thankfully did not hit her head. She falls backwards and forward, with and without the walker. She had HH PT/OT/ST for about 6 weeks through [facility] and was told to not transfer or walk without the walker. She sometimes forgets to use the walker. She has become more weak in general. She has had more difficulty feeding herself.  I first met them on 01/20/2013 and she previously followed with Dr. Avie Echevaria for about 12 years. At the time of our first visit I suggested stopping the selegiline as she did not notice much in the way of improvement in her freezing after restarting it. I increased her Sinemet to one whole pill twice daily and continued her pramipexole and amantadine. She has an underlying medical history of spinal stenosis, severe kyphoscoliosis, heart disease with Hx of MI, arthritis, bladder cancer, hypertension, hyperlipidemia, diabetes, peptic ulcer disease, adrenal insufficiency, anxiety, fibromyalgia, recurrent UTIs and recurrent falls. She is status post tonsillectomy, appendectomy and abdominal  hysterectomy.  She has had more freezing, some intermittent confusion, more fatigue, more daytime sleepiness, per husband. After taking the selegiline off, she may have had more freezing. She has had recurrent falls. She broke her L wrist and had a cast for 6 weeks. She has been falling repeatedly. She hurt her ribs.   Her Past Medical History Is Significant For: Past Medical History  Diagnosis Date  . MI, old   . Parkinson disease   . Scoliosis   . Arthritis   . Bladder cancer   . HTN (hypertension) 04/21/2012  . Hyperlipidemia 04/21/2012  . DM (diabetes mellitus) 04/21/2012  . CAD (coronary artery disease) 04/21/2012  . Barrett esophagus 04/21/2012  . PUD (peptic ulcer disease) 04/21/2012  . Adrenal insufficiency 04/21/2012  . Raynaud's disease 04/21/2012  . Anxiety 04/21/2012  . Fibromyalgia 04/21/2012  . Hx of bladder cancer 04/21/2012  . Edema of lower extremity 04/21/2012    Chronic lower extremity edema  . Falls 04/21/2012  . Recurrent UTI 01/20/2013    Her Past Surgical History Is Significant For: Past Surgical History  Procedure Laterality Date  . Tonsillectomy    . Appendectomy    . Abdominal hysterectomy      Her Family History Is Significant For: Family History  Problem Relation Age of Onset  . Heart disease Mother   . Diabetes Mother   . Bladder Cancer Maternal Aunt     Her Social History Is Significant For: History   Social History  . Marital  Status: Married    Spouse Name: N/A    Number of Children: N/A  . Years of Education: N/A   Social History Main Topics  . Smoking status: Former Smoker    Types: Cigarettes  . Smokeless tobacco: Never Used  . Alcohol Use: No  . Drug Use: No  . Sexual Activity: No   Other Topics Concern  . None   Social History Narrative  . None    Her Allergies Are:  Allergies  Allergen Reactions  . Mirtazapine   . Codeine Hives  . Latex   :   Her Current Medications Are:  Outpatient Encounter Prescriptions as of  08/25/2013  Medication Sig Dispense Refill  . amantadine (SYMMETREL) 100 MG capsule Take 1 capsule (100 mg total) by mouth daily.  90 capsule  3  . amLODipine (NORVASC) 10 MG tablet Take 10 mg by mouth every evening.      Marland Kitchen aspirin 81 MG tablet Take 81 mg by mouth daily.      . carbidopa-levodopa (SINEMET IR) 25-100 MG per tablet Take 1 tablet by mouth 3 (three) times daily. At 10 am, 2 pm and 6 pm  270 tablet  3  . famotidine (PEPCID) 20 MG tablet Take 1 tablet (20 mg total) by mouth 2 (two) times daily.  60 tablet  0  . feeding supplement (ENSURE COMPLETE) LIQD Take 237 mLs by mouth daily at 3 pm.      . FLUZONE HIGH-DOSE injection Inject 0.5 mLs into the muscle once.       . furosemide (LASIX) 40 MG tablet Take 40 mg by mouth daily.      . Multiple Vitamins-Minerals (OCUVITE PO) Take 1 tablet by mouth every evening.      . Omega-3 Fatty Acids (FISH OIL) 1000 MG CAPS Take 1,000 mg by mouth every evening.      Marland Kitchen OVER THE COUNTER MEDICATION Place 2 drops into both eyes daily as needed. Lubricant eye drops for dry eyes      . pantoprazole (PROTONIX) 40 MG tablet Take 1 tablet (40 mg total) by mouth 2 (two) times daily.  60 tablet  0  . temazepam (RESTORIL) 30 MG capsule Take 30 mg by mouth at bedtime.      . triamcinolone cream (KENALOG) 0.1 % Apply topically 2 (two) times daily.  30 g  1  . polyethylene glycol powder (GLYCOLAX/MIRALAX) powder Take 17 g by mouth daily.  527 g  1  . senna (SENOKOT) 8.6 MG TABS tablet Take 1 tablet (8.6 mg total) by mouth daily as needed.  120 each  0  . [DISCONTINUED] cephALEXin (KEFLEX) 500 MG capsule Take 1 capsule (500 mg total) by mouth 2 (two) times daily.  14 capsule  0   No facility-administered encounter medications on file as of 08/25/2013.   Review of Systems:  Out of a complete 14 point review of systems, all are reviewed and negative with the exception of these symptoms as listed below:  Review of Systems  Constitutional: Positive for fatigue.   HENT: Positive for tinnitus.   Eyes: Positive for visual disturbance.  Respiratory: Positive for shortness of breath.   Cardiovascular: Positive for leg swelling.  Gastrointestinal: Positive for diarrhea and constipation.  Endocrine: Negative.   Genitourinary:       Incontinence  Musculoskeletal: Negative.   Skin: Negative.   Allergic/Immunologic: Positive for immunocompromised state.  Neurological: Positive for tremors, speech difficulty and weakness.       Memory loss  Hematological: Bruises/bleeds easily.  Psychiatric/Behavioral: Positive for hallucinations and confusion.    Objective:  Neurologic Exam  Physical Exam Physical Examination:   Filed Vitals:   08/25/13 1458  BP: 141/73  Pulse: 60    General Examination: The patient is a very pleasant 79 y.o. female in no acute distress. She is situated in her chair and brought her 4 wheeled walker.   HEENT: Normocephalic, atraumatic, pupils are equal, round and reactive to light and accommodation. Extraocular tracking is good without nystagmus noted. She has a bruise around her left wrist. Gaze is conjugate. Normal smooth pursuit is noted. Hearing is grossly intact. Face is symmetric with mild facial masking noted. Speech is clear with no dysarthria noted. Voice is slightly soft. There is no lip, neck or jaw tremor. Neck is mildly rigid. There are no carotid bruits on auscultation. Oropharynx exam reveals moderate mouth dryness. No significant airway crowding is noted. Mallampati is class II. Tongue protrudes centrally and palate elevates symmetrically.  Chest: is clear to auscultation without wheezing, rhonchi or crackles noted.  Heart: sounds are normal with a 1/6 systolic murmur, no rubs or gallops noted.  Abdomen: is soft, non-tender and non-distended with normal bowel sounds appreciated on auscultation.  Extremities: There is 2+ pitting edema in the distal lower extremities bilaterally, L more than R. Pedal pulses are  intact.  Skin: is warm and dry with no trophic changes noted.  Musculoskeletal: exam reveals significant kyphoscoliosis, uneven shoulder height.  Neurologically: Mental status: The patient is awake, alert and oriented in all 4 spheres. Her Memory, attention, language and knowledge are mildly impaired. There is no aphasia, agnosia, apraxia or anomia. Cranial nerves are as described above under HEENT exam.  Motor exam: Normal bulk, strength and tone is noted. There is no drift, tremor or rebound. Romberg is negative. Reflexes are 1+ throughout. Fine motor skills are moderately impaired with finger taps and hand movements b/l, L worse than R. She has moderate to severe impairment of foot taps b/l, L worse than R. She has a slight postural and action tremor on the right only. Cerebellar testing shows no dysmetria or intention tremor on finger to nose testing. There is no truncal or gait ataxia.  Sensory exam is intact to light touch. She needs several attempts to stand and has to push herself up. She has a severely stooped posture and abnormal kyphoscoliosis. She walks with a rolling walker and maneuvers it fairly well. She has significant episodes of freezing but does have some start hesitation and has a tendency to fall with transferring.            Assessment and Plan:   In summary, TAMAKIA PORTO is a very pleasant 79 year old female with an over 12 year history of Parkinson's disease, complicated by severe posture changes, gait d/o, recurrent falls. She has a heart murmur, has a Hx of CHF and now has LE edema and c/o SOBOE. I would like for her to see her cardiologist again. She had an echo in 1/14, which I reviewed.  I again had a long chat with the patient and her husband about my findings and the diagnosis of advanced PD, its prognosis and treatment options. We talked about medical treatments and non-pharmacological approaches. We talked about maintaining a healthy lifestyle in general. I  encouraged the patient to eat healthy, exercise in the form of walking with her walker and keep well hydrated, to keep a scheduled bedtime and wake time routine, to not skip  any meals and eat healthy snacks in between meals. Today we focused primarily on counseling and coordination of care during our 40 minute visit. I explained to her at length that she is at significant fall risk and that she should not be walking without her walker. She is advised to use a grabber it instead of reaching down to pick up things and we need to try to implement a schedule with her PD medication. Her gait disorder is a function of advancing age, severe kyphoscoliosis, weakness, CHF, and advanced PD with freezing.  I recommended the following at this time: I would like for her to take Sinemet 3 times a day namely at 10, 2 PM and 6 PM. She has currently been taking her first pill around noon and the last pill some time in the evening. I again stressed the importance of staying on a more regular schedule. Her amantadine and Mirapex ER will stay the same. I have encouraged them to call with any interim questions and would like to see her back in about 3 months, sooner if the need arises. She did not need any refills today. They demonstrated understanding and voiced agreement with the plan.

## 2013-09-05 ENCOUNTER — Emergency Department (HOSPITAL_BASED_OUTPATIENT_CLINIC_OR_DEPARTMENT_OTHER): Payer: Medicare Other

## 2013-09-05 ENCOUNTER — Encounter (HOSPITAL_BASED_OUTPATIENT_CLINIC_OR_DEPARTMENT_OTHER): Payer: Self-pay | Admitting: Emergency Medicine

## 2013-09-05 ENCOUNTER — Inpatient Hospital Stay (HOSPITAL_BASED_OUTPATIENT_CLINIC_OR_DEPARTMENT_OTHER)
Admission: EM | Admit: 2013-09-05 | Discharge: 2013-09-07 | DRG: 684 | Disposition: A | Discharge status: Home Health | Payer: Medicare Other | Attending: Family Medicine | Admitting: Family Medicine

## 2013-09-05 DIAGNOSIS — Z79899 Other long term (current) drug therapy: Secondary | ICD-10-CM

## 2013-09-05 DIAGNOSIS — W19XXXA Unspecified fall, initial encounter: Secondary | ICD-10-CM | POA: Diagnosis present

## 2013-09-05 DIAGNOSIS — N179 Acute kidney failure, unspecified: Principal | ICD-10-CM | POA: Diagnosis present

## 2013-09-05 DIAGNOSIS — Z7982 Long term (current) use of aspirin: Secondary | ICD-10-CM

## 2013-09-05 DIAGNOSIS — I7300 Raynaud's syndrome without gangrene: Secondary | ICD-10-CM | POA: Diagnosis present

## 2013-09-05 DIAGNOSIS — E119 Type 2 diabetes mellitus without complications: Secondary | ICD-10-CM | POA: Diagnosis present

## 2013-09-05 DIAGNOSIS — I252 Old myocardial infarction: Secondary | ICD-10-CM

## 2013-09-05 DIAGNOSIS — E860 Dehydration: Secondary | ICD-10-CM | POA: Diagnosis present

## 2013-09-05 DIAGNOSIS — K2270 Barrett's esophagus without dysplasia: Secondary | ICD-10-CM | POA: Diagnosis present

## 2013-09-05 DIAGNOSIS — I10 Essential (primary) hypertension: Secondary | ICD-10-CM | POA: Diagnosis present

## 2013-09-05 DIAGNOSIS — N189 Chronic kidney disease, unspecified: Secondary | ICD-10-CM | POA: Diagnosis present

## 2013-09-05 DIAGNOSIS — G20A1 Parkinson's disease without dyskinesia, without mention of fluctuations: Secondary | ICD-10-CM | POA: Diagnosis present

## 2013-09-05 DIAGNOSIS — G20 Parkinson's disease: Secondary | ICD-10-CM | POA: Diagnosis present

## 2013-09-05 DIAGNOSIS — F411 Generalized anxiety disorder: Secondary | ICD-10-CM | POA: Diagnosis present

## 2013-09-05 DIAGNOSIS — I129 Hypertensive chronic kidney disease with stage 1 through stage 4 chronic kidney disease, or unspecified chronic kidney disease: Secondary | ICD-10-CM | POA: Diagnosis present

## 2013-09-05 DIAGNOSIS — Z8551 Personal history of malignant neoplasm of bladder: Secondary | ICD-10-CM

## 2013-09-05 DIAGNOSIS — F329 Major depressive disorder, single episode, unspecified: Secondary | ICD-10-CM | POA: Diagnosis present

## 2013-09-05 DIAGNOSIS — E785 Hyperlipidemia, unspecified: Secondary | ICD-10-CM | POA: Diagnosis present

## 2013-09-05 DIAGNOSIS — S0003XA Contusion of scalp, initial encounter: Secondary | ICD-10-CM | POA: Diagnosis present

## 2013-09-05 DIAGNOSIS — R296 Repeated falls: Secondary | ICD-10-CM

## 2013-09-05 DIAGNOSIS — F3289 Other specified depressive episodes: Secondary | ICD-10-CM | POA: Diagnosis present

## 2013-09-05 DIAGNOSIS — Y92009 Unspecified place in unspecified non-institutional (private) residence as the place of occurrence of the external cause: Secondary | ICD-10-CM

## 2013-09-05 DIAGNOSIS — Z87891 Personal history of nicotine dependence: Secondary | ICD-10-CM

## 2013-09-05 DIAGNOSIS — Z9181 History of falling: Secondary | ICD-10-CM

## 2013-09-05 DIAGNOSIS — I2510 Atherosclerotic heart disease of native coronary artery without angina pectoris: Secondary | ICD-10-CM | POA: Diagnosis present

## 2013-09-05 LAB — CBC WITH DIFFERENTIAL/PLATELET
Basophils Relative: 0 % (ref 0–1)
Eosinophils Absolute: 0.2 10*3/uL (ref 0.0–0.7)
HCT: 30.8 % — ABNORMAL LOW (ref 36.0–46.0)
Hemoglobin: 10 g/dL — ABNORMAL LOW (ref 12.0–15.0)
Lymphs Abs: 1.4 10*3/uL (ref 0.7–4.0)
MCHC: 32.5 g/dL (ref 30.0–36.0)
MCV: 88.8 fL (ref 78.0–100.0)
Monocytes Absolute: 0.7 10*3/uL (ref 0.1–1.0)
Monocytes Relative: 9 % (ref 3–12)
Neutro Abs: 5.1 10*3/uL (ref 1.7–7.7)
Neutrophils Relative %: 69 % (ref 43–77)
RBC: 3.47 MIL/uL — ABNORMAL LOW (ref 3.87–5.11)
RDW: 14.9 % (ref 11.5–15.5)

## 2013-09-05 LAB — URINALYSIS, ROUTINE W REFLEX MICROSCOPIC
Glucose, UA: NEGATIVE mg/dL
Hgb urine dipstick: NEGATIVE
Nitrite: NEGATIVE
Specific Gravity, Urine: 1.013 (ref 1.005–1.030)
Urobilinogen, UA: 0.2 mg/dL (ref 0.0–1.0)

## 2013-09-05 LAB — PROTIME-INR
INR: 1.07 (ref 0.00–1.49)
Prothrombin Time: 13.7 seconds (ref 11.6–15.2)

## 2013-09-05 LAB — COMPREHENSIVE METABOLIC PANEL
Albumin: 3.9 g/dL (ref 3.5–5.2)
Alkaline Phosphatase: 109 U/L (ref 39–117)
BUN: 58 mg/dL — ABNORMAL HIGH (ref 6–23)
CO2: 28 mEq/L (ref 19–32)
Chloride: 103 mEq/L (ref 96–112)
GFR calc Af Amer: 20 mL/min — ABNORMAL LOW (ref >90)
GFR calc non Af Amer: 18 mL/min — ABNORMAL LOW (ref >90)
Glucose, Bld: 105 mg/dL — ABNORMAL HIGH (ref 70–99)
Potassium: 4.4 mEq/L (ref 3.5–5.1)
Total Bilirubin: 0.4 mg/dL (ref 0.3–1.2)

## 2013-09-05 LAB — GLUCOSE, CAPILLARY: Glucose-Capillary: 131 mg/dL — ABNORMAL HIGH (ref 70–99)

## 2013-09-05 LAB — PRO B NATRIURETIC PEPTIDE: Pro B Natriuretic peptide (BNP): 1649 pg/mL — ABNORMAL HIGH (ref 0–450)

## 2013-09-05 LAB — TROPONIN I
Troponin I: <0.3 ng/mL (ref <0.30)
Troponin I: <0.3 ng/mL (ref <0.30)

## 2013-09-05 MED ORDER — SODIUM CHLORIDE 0.9 % IV SOLN: Freq: Once | INTRAVENOUS | Status: DC

## 2013-09-05 MED ORDER — AMANTADINE HCL 100 MG PO CAPS
100.0000 mg | ORAL_CAPSULE | Freq: Every day | ORAL | Status: DC
  Administered 2013-09-06 – 2013-09-07 (×2): 100 mg via ORAL
  Filled 2013-09-05 (×2): qty 1

## 2013-09-05 MED ORDER — ASPIRIN 81 MG PO CHEW
81.0000 mg | CHEWABLE_TABLET | Freq: Every day | ORAL | Status: DC
  Administered 2013-09-05 – 2013-09-07 (×3): 81 mg via ORAL
  Filled 2013-09-05 (×3): qty 1

## 2013-09-05 MED ORDER — SODIUM CHLORIDE 0.9 % IV SOLN
INTRAVENOUS | Status: DC
  Administered 2013-09-05 – 2013-09-07 (×4): via INTRAVENOUS

## 2013-09-05 MED ORDER — ACETAMINOPHEN 325 MG PO TABS
650.0000 mg | ORAL_TABLET | Freq: Four times a day (QID) | ORAL | Status: DC | PRN
  Administered 2013-09-06: 650 mg via ORAL
  Filled 2013-09-05 (×2): qty 2

## 2013-09-05 MED ORDER — SODIUM CHLORIDE 0.9 % IV BOLUS (SEPSIS)
500.0000 mL | Freq: Once | INTRAVENOUS | Status: AC
  Administered 2013-09-05: 500 mL via INTRAVENOUS

## 2013-09-05 MED ORDER — ONDANSETRON HCL 4 MG/2ML IJ SOLN: 4.0000 mg | Freq: Three times a day (TID) | INTRAMUSCULAR | Status: AC | PRN

## 2013-09-05 MED ORDER — SODIUM CHLORIDE 0.9 % IV SOLN
INTRAVENOUS | Status: DC
  Administered 2013-09-05: 16:00:00 via INTRAVENOUS

## 2013-09-05 MED ORDER — ASPIRIN 81 MG PO TABS: 81.0000 mg | ORAL_TABLET | Freq: Every day | ORAL | Status: DC

## 2013-09-05 MED ORDER — HEPARIN SODIUM (PORCINE) 5000 UNIT/ML IJ SOLN
5000.0000 [IU] | Freq: Three times a day (TID) | INTRAMUSCULAR | Status: DC
  Administered 2013-09-05 – 2013-09-07 (×5): 5000 [IU] via SUBCUTANEOUS
  Filled 2013-09-05 (×8): qty 1

## 2013-09-05 MED ORDER — ACETAMINOPHEN 650 MG RE SUPP: 650.0000 mg | Freq: Four times a day (QID) | RECTAL | Status: DC | PRN

## 2013-09-05 MED ORDER — INSULIN ASPART 100 UNIT/ML ~~LOC~~ SOLN
0.0000 [IU] | Freq: Three times a day (TID) | SUBCUTANEOUS | Status: DC
  Administered 2013-09-07: 3 [IU] via SUBCUTANEOUS

## 2013-09-05 MED ORDER — SODIUM CHLORIDE 0.9 % IJ SOLN: 3.0000 mL | Freq: Two times a day (BID) | INTRAMUSCULAR | Status: DC

## 2013-09-05 MED ORDER — POLYETHYLENE GLYCOL 3350 17 G PO PACK
17.0000 g | PACK | Freq: Every day | ORAL | Status: DC | PRN
  Filled 2013-09-05: qty 1

## 2013-09-05 MED ORDER — AMLODIPINE BESYLATE 10 MG PO TABS
10.0000 mg | ORAL_TABLET | Freq: Every evening | ORAL | Status: DC
  Administered 2013-09-05 – 2013-09-06 (×2): 10 mg via ORAL
  Filled 2013-09-05 (×3): qty 1

## 2013-09-05 MED ORDER — ENSURE COMPLETE PO LIQD
237.0000 mL | Freq: Every day | ORAL | Status: DC
  Administered 2013-09-05 – 2013-09-07 (×3): 237 mL via ORAL

## 2013-09-05 MED ORDER — FAMOTIDINE 20 MG PO TABS
20.0000 mg | ORAL_TABLET | Freq: Every day | ORAL | Status: DC
  Administered 2013-09-05 – 2013-09-07 (×3): 20 mg via ORAL
  Filled 2013-09-05 (×3): qty 1

## 2013-09-05 MED ORDER — PANTOPRAZOLE SODIUM 40 MG PO TBEC
40.0000 mg | DELAYED_RELEASE_TABLET | Freq: Two times a day (BID) | ORAL | Status: DC
  Administered 2013-09-05 – 2013-09-07 (×4): 40 mg via ORAL
  Filled 2013-09-05 (×3): qty 1

## 2013-09-05 MED ORDER — CARBIDOPA-LEVODOPA 25-100 MG PO TABS
1.0000 | ORAL_TABLET | Freq: Three times a day (TID) | ORAL | Status: DC
  Administered 2013-09-05 – 2013-09-07 (×6): 1 via ORAL
  Filled 2013-09-05 (×8): qty 1

## 2013-09-05 MED ORDER — TEMAZEPAM 15 MG PO CAPS
30.0000 mg | ORAL_CAPSULE | Freq: Every day | ORAL | Status: DC
  Administered 2013-09-05: 30 mg via ORAL
  Filled 2013-09-05: qty 2

## 2013-09-05 NOTE — ED Notes (Signed)
Patient still in radiology.

## 2013-09-05 NOTE — Progress Notes (Signed)
Admission note:  Arrival Method: from [facility] via [facility] Mental Orientation: A&O, but confused at times Telemetry: N/A Assessment: See docflowsheets Skin: Bruising right and left hip, and bilateral arms, edematous right third finger IV: Rt hand Pain: No c/o pain Tubes: N/A Safety Measures: Patient Handbook has been given, and discussed the Fall Prevention worksheet. Left at bedside  Admission: Admitting MD notified [id number] Orientation: Patient has been oriented to the unit, staff and to the room.  Family: Not at bedside

## 2013-09-05 NOTE — ED Provider Notes (Signed)
CSN: [id number]     Arrival date & time 09/05/13  7829 History   First MD Initiated Contact with Patient 09/05/13 [phone]     Chief Complaint  Patient presents with  . Hip Pain  . Fall   (Consider location/radiation/quality/duration/timing/severity/associated sxs/prior Treatment) HPI Comments: Patient presents with pain in her left hip and head after falling around 6 AM. Patient has Parkinson's disease normally walks with a walker. Her husband states she has fallen 11 times in the past week. She laid on the floor for 3 hours before he found her this morning. She denies losing consciousness. She is not taking anticoagulants. She complains of pain in her left hip and her head. She has multiple areas of ecchymosis and bruising throughout her extremities. Denies any chest pain, abdominal pain, nausea vomiting. No dizziness or lightheadedness. Good by mouth intake and urine output.  The history is provided by the patient.    Past Medical History  Diagnosis Date  . MI, old   . Parkinson disease   . Scoliosis   . Arthritis   . Bladder cancer   . HTN (hypertension) 04/21/2012  . Hyperlipidemia 04/21/2012  . DM (diabetes mellitus) 04/21/2012  . CAD (coronary artery disease) 04/21/2012  . Barrett esophagus 04/21/2012  . PUD (peptic ulcer disease) 04/21/2012  . Adrenal insufficiency 04/21/2012  . Raynaud's disease 04/21/2012  . Anxiety 04/21/2012  . Fibromyalgia 04/21/2012  . Hx of bladder cancer 04/21/2012  . Edema of lower extremity 04/21/2012    Chronic lower extremity edema  . Falls 04/21/2012  . Recurrent UTI 01/20/2013   Past Surgical History  Procedure Laterality Date  . Tonsillectomy    . Appendectomy    . Abdominal hysterectomy     Family History  Problem Relation Age of Onset  . Heart disease Mother   . Diabetes Mother   . Bladder Cancer Maternal Aunt    History  Substance Use Topics  . Smoking status: Former Smoker    Types: Cigarettes  . Smokeless tobacco: Never Used  . Alcohol  Use: No   OB History   Grav Para Term Preterm Abortions TAB SAB Ect Mult Living                 Review of Systems  Constitutional: Negative for fever, activity change and appetite change.  Eyes: Negative for photophobia.  Respiratory: Negative for chest tightness.   Cardiovascular: Negative for chest pain.  Gastrointestinal: Negative for nausea, vomiting and abdominal pain.  Genitourinary: Negative for vaginal bleeding and vaginal discharge.  Musculoskeletal: Positive for arthralgias, gait problem and myalgias. Negative for back pain.  Neurological: Positive for weakness and headaches. Negative for dizziness and light-headedness.  A complete 10 system review of systems was obtained and all systems are negative except as noted in the HPI and PMH.    Allergies  Mirtazapine; Codeine; and Latex  Home Medications   No current outpatient prescriptions on file. BP 140/80  Pulse 66  Temp(Src) 97 F (36.1 C) (Oral)  Resp 16  Ht 4\' 8"  (1.422 m)  Wt 106 lb (48.081 kg)  BMI 23.78 kg/m2  SpO2 100% Physical Exam  Constitutional: She is oriented to person, place, and time. She appears well-developed and well-nourished. No distress.  HENT:  Head: Normocephalic and atraumatic.  Mouth/Throat: Oropharynx is clear and moist. No oropharyngeal exudate.  Bruising and ecchymosis to left occiput  Eyes: Conjunctivae and EOM are normal. Pupils are equal, round, and reactive to light.  Neck: Normal range of  motion. Neck supple.  Diffuse paraspinal pain, no midline pain.  Cardiovascular: Normal rate, regular rhythm and normal heart sounds.   No murmur heard. Pulmonary/Chest: Effort normal and breath sounds normal. No respiratory distress.  Abdominal: Soft. There is no tenderness. There is no rebound and no guarding.  Musculoskeletal: Normal range of motion. She exhibits tenderness. She exhibits no edema.  Ecchymosis to left hip, left wrist, left lateral hand. +2 radial pulse, cardinal hand  movements are intact.  Able to range L hip with minimal pain.   Neurological: She is alert and oriented to person, place, and time. No cranial nerve deficit. She exhibits normal muscle tone. Coordination normal.  CN 2-12 intact, no ataxia on finger to nose, no nystagmus, 5/5 strength throughout, no pronator drift, Romberg negative, normal gait.   Skin: Skin is warm.    ED Course  Procedures (including critical care time) Labs Review Labs Reviewed  CBC WITH DIFFERENTIAL - Abnormal; Notable for the following:    RBC 3.47 (*)    Hemoglobin 10.0 (*)    HCT 30.8 (*)    All other components within normal limits  COMPREHENSIVE METABOLIC PANEL - Abnormal; Notable for the following:    Glucose, Bld 105 (*)    BUN 58 (*)    Creatinine, Ser 2.40 (*)    GFR calc non Af Amer 18 (*)    GFR calc Af Amer 20 (*)    All other components within normal limits  CK TOTAL AND CKMB - Abnormal; Notable for the following:    CK, MB 5.7 (*)    Relative Index 3.7 (*)    All other components within normal limits  PRO B NATRIURETIC PEPTIDE - Abnormal; Notable for the following:    Pro B Natriuretic peptide (BNP) 1649.0 (*)    All other components within normal limits  TROPONIN I  PROTIME-INR  URINALYSIS, ROUTINE W REFLEX MICROSCOPIC   Imaging Review Ct Abdomen Pelvis Wo Contrast  09/05/2013   CLINICAL DATA:  Fall with hip and rib pain.  EXAM: CT ABDOMEN AND PELVIS WITHOUT CONTRAST  TECHNIQUE: Multidetector CT imaging of the abdomen and pelvis was performed following the standard protocol without intravenous contrast.  COMPARISON:  05/28/2011  FINDINGS: Moderate size hiatal hernia unchanged. The liver, spleen, gallbladder and adrenal glands are unremarkable. There is fatty atrophy of the pancreas. Kidneys are within normal in size without evidence of hydronephrosis or nephrolithiasis. There is moderate calcification of the aorta and iliac vessels.  Pelvic images demonstrate minimal fluid over the right  anterior pelvis extending into the inguinal canal the. There is subcutaneous edema over the posterior lateral left gluteal region and hip. There is an old right inferior pubic ramus fracture. No definite acute fracture is seen over the pelvis. There are significant degenerative changes of the lumbar spine with moderate curvature of the thoracolumbar spine convex to the right. There are degenerative changes of the hips.  IMPRESSION: No acute findings in the abdomen/pelvis.  The mild free fluid over the right pelvis extending into the inguinal canal. Subcutaneous edema over the posterior lateral left gluteal region and left hip.  Moderate size hiatal hernia.  Atherosclerotic disease of the aortoiliac system.  Moderate degenerative change of the spine with curvature convex to the right. Old right pelvic fracture.   Electronically Signed   By: Elberta Fortis M.D.   On: 09/05/2013 11:34   Dg Chest 1 View  09/05/2013   CLINICAL DATA:  Initial encounter for a fall with left  sided pain and bruising.  EXAM: CHEST - 1 VIEW  COMPARISON:  Two-view chest x-ray and left ribs 03/14/2013 [facility].  FINDINGS: Cardiac silhouette moderately enlarged but stable. Thoracic aorta tortuous in a atherosclerotic, unchanged. Moderate-sized hiatal hernia, unchanged. Hilar and mediastinal contours otherwise unremarkable. Chronic elevation of the right hemidiaphragm and stable chronic scar/atelectasis at the right base. Lungs otherwise clear. No pneumothorax. No pleural effusions.  Further displacement of the previously identified fractures involving the left 7th through 10th ribs since the prior examination. Generalized osseous demineralization. Thoracolumbar scoliosis convex right. Calcification in the right supraspinatus tendon at its insertion on the greater tuberosity of the right humerus.  IMPRESSION: 1. Stable moderate cardiomegaly. Stable chronic scar/atelectasis at the right lung base associated with chronic  elevation of the right hemidiaphragm. No acute cardiopulmonary disease. 2. Further displacement of the previously identified left 7th through 10th rib fractures. This could certainly have happened at the time of the current fall if there is point tenderness on physical examination in this region.   Electronically Signed   By: Hulan Saas M.D.   On: 09/05/2013 09:52   Dg Wrist Complete Left  09/05/2013   CLINICAL DATA:  Larey Seat on left side, left side pain and bruising  EXAM: LEFT WRIST - COMPLETE 3+ VIEW  COMPARISON:  Left hand radiographs 03/14/2013  FINDINGS: Osseous demineralization.  Old distal left radial metaphyseal and ulnar styloid fractures.  Radiocarpal joint space narrowing.  Soft tissue swelling at wrist and distal forearm.  No definite acute fracture, dislocation, or bone destruction.  Mild ulnar plus variance.  IMPRESSION: Old distal left radial metaphyseal and ulnar styloid fractures.  No definite acute osseous findings.   Electronically Signed   By: Ulyses Southward M.D.   On: 09/05/2013 09:52   Dg Hip Complete Left  09/05/2013   CLINICAL DATA:  Initial encounter for a fall with pain and bruising in the vicinity of the left hip.  EXAM: LEFT HIP - COMPLETE 2+ VIEW  COMPARISON:  01/18/2013 [facility].  FINDINGS: No evidence of acute fracture or dislocation. Mild axial joint space narrowing, unchanged. Well preserved bone mineral density for age. Large soft tissue mass consistent with hematoma in the left gluteal region.  Included AP pelvis demonstrates an old gas that included AP pelvis demonstrates old healed fractures of the right superior and inferior pubic rami, unchanged. Stable symmetric mild axial joint space narrowing in the right hip. Sacroiliac joints and symphysis pubis intact with mild degenerative changes. Degenerative changes involving the visualized lower lumbar spine.  IMPRESSION: 1. No acute osseous abnormality. 2. Soft tissue mass in the left gluteal region  consistent with hematoma. 3. Symmetric mild osteoarthritis in both hips, stable. 4. Degenerative changes involving the visualized lower lumbar spine.   Electronically Signed   By: Hulan Saas M.D.   On: 09/05/2013 09:48   Ct Head Wo Contrast  09/05/2013   CLINICAL DATA:  Fall with hip pain. Head trauma left posterior head.  EXAM: CT HEAD WITHOUT CONTRAST  TECHNIQUE: Contiguous axial images were obtained from the base of the skull through the vertex without intravenous contrast.  COMPARISON:  01/28/2013 and 05/23/2012  FINDINGS: Ventricles, cisterns and other CSF spaces are within normal. There is minimal chronic ischemic microvascular disease present. There is no mass, mass effect, shift of midline structures or acute hemorrhage. There is no evidence to suggest acute ischemia. There is a left posterior parietal scalp contusion. There is no evidence of fracture.  There is atherosclerotic disease of the cavernous segment of the internal carotid arteries.  IMPRESSION: No acute intracranial findings. Small left posterior parietal scalp contusion.  Chronic ischemic microvascular disease.   Electronically Signed   By: Elberta Fortis M.D.   On: 09/05/2013 09:41   Ct Chest Wo Contrast  09/05/2013   CLINICAL DATA:  Fall. Hip and rib pain.  EXAM: CT CHEST WITHOUT CONTRAST  TECHNIQUE: Multidetector CT imaging of the chest was performed following the standard protocol without IV contrast.  COMPARISON:  11/21/2012  FINDINGS: The lungs are adequately inflated without focal consolidation or effusion. There are 2 small calcified granulomas over the left lung. There is mild cardiomegaly with a small amount of pericardial fluid present. There is moderate calcified atherosclerotic disease involving coronary arteries. There is a moderate size hiatal hernia unchanged. There are multiple old left anterior and right posterior rib fractures. There are degenerative changes of the spine. There is moderate curvature of the thoracic  spine convex to the right. There is a moderate compression fracture of T7 which is new since the previous exam.  IMPRESSION: No acute findings in the chest.  Bilateral old rib fractures.  Mild cardiomegaly with small pericardial effusion.  Stable moderate size hiatal hernia.  Moderate atherosclerotic coronary artery disease.   Electronically Signed   By: Elberta Fortis M.D.   On: 09/05/2013 11:23   Ct Cervical Spine Wo Contrast  09/05/2013   CLINICAL DATA:  Fall.  EXAM: CT CERVICAL SPINE WITHOUT CONTRAST  TECHNIQUE: Multidetector CT imaging of the cervical spine was performed without intravenous contrast. Multiplanar CT image reconstructions were also generated.  COMPARISON:  11/16/2008  FINDINGS: There is mild motion artifact. The examination demonstrates moderate spondylosis throughout the cervical spine. There is moderate disc space narrowing at the C3-4 level and to a lesser extent at the C4-5, C5-6 and C6-7 levels. The prevertebral soft tissues are within normal. There is subtle posterior subluxation of C3 on C4 and C4 on C5 the which is degenerative in nature and unchanged. There is approximately 3 mm of anterior subluxation of C7 on T1 which is degenerative in nature and slightly worse compared to the prior exam. There is moderate uncovertebral joint spurring and facet arthropathy. Borderline canal stenosis at the C5-6 level due to posterior osteophyte formation. The facet changes are the severe at the C7-T1 level.  IMPRESSION: Moderate spondylosis with multilevel degenerative disk disease and degenerative subluxations as described. No acute fracture.  Borderline canal stenosis at the C5-6 level due to posterior osteophyte formation.   Electronically Signed   By: Elberta Fortis M.D.   On: 09/05/2013 09:50   Dg Hand Complete Left  09/05/2013   CLINICAL DATA:  Larey Seat on left side, pain and bruising  EXAM: LEFT HAND - COMPLETE 3+ VIEW  COMPARISON:  03/14/2013  FINDINGS: Osseous demineralization.  Old healed  posttraumatic deformity of the distal radial metaphysis with slight dorsal tilt of distal radial articular surface.  Nonunion of an old ulnar styloid fracture.  Sclerosis identified at the base of the 5th metacarpal consistent with healing fracture.  Scattered narrowing of interphalangeal and radiocarpal joints.  Soft tissue swelling at wrist and distal forearm.  No definite acute fracture, dislocation, or bone destruction.  IMPRESSION: Osseous demineralization with old healed distal radial metaphysis seal fracture.  Old ulnar styloid fracture.  Healing fracture at base of 5th metacarpal.  No definite acute bony abnormalities.   Electronically Signed   By: Ulyses Southward M.D.   On:  09/05/2013 09:50    EKG Interpretation     Ventricular Rate:  61 PR Interval:  146 QRS Duration: 90 QT Interval:  430 QTC Calculation: 432 R Axis:   -52 Text Interpretation:  Normal sinus rhythm Left anterior fascicular block Possible Lateral infarct , age undetermined Abnormal ECG No significant change was found            MDM   1. Acute renal failure   2. Recurrent falls    Parkinson's disease with recurrent falls, head and hip pain. No loss of consciousness. Neurologically intact.  Patient CT head and C-spine are negative for acute trauma.  Creatinine is 2.4 Baseline appears to be around 1.3. Labs otherwise appear to be at baseline.  Extremity xrays negative of acute fractures.   Old rib fractures appear to be more displaced. No PTX or spleen injury.  Concern for recurrent falls and need for probable placement.  Will admit with ARF and need for PT/OT evaluation.  Glynn Octave, MD 09/05/13 [phone]

## 2013-09-05 NOTE — ED Notes (Addendum)
MD notified Rectal temp 95.6.  Warm Blankets provided.

## 2013-09-05 NOTE — Progress Notes (Addendum)
Patient's PCP is listed as Dr. Patsy Lager, with [facility].  Have discussed with Family Practice resident.  He will admit. Patient reports she used to see C. Katrinka Blazing, but now seeing [facility] and has multiple visits with them noted in Epic.  D/w RN. Pt is stable.  Crista Curb, M.D. [facility] [phone]

## 2013-09-05 NOTE — Progress Notes (Signed)
PENDING ACCEPTANCE TRANFER NOTE:  Call received from:    [facility]  REASON FOR REQUESTING ADMISSION:    Fall, ARF  HPI:   With history of Parkinson disease and multiple falls. Patient given to the hospital because she fell at home she was on the floor for about 3 hours. X-rays did not show no fractures but multiple old healing/healed fractures. There is hip hematoma but no fracture. She also does have acute renal failure. Patient admitted to the hospital for further evaluation. Please obtain PT/OT for potential placement   PLAN:  According to telephone report, this patient was accepted for transfer to [facility],   Under [employer] team:  10,  I have requested an order be written to call Flow Manager at [phone] upon patient arrival to the floor for final physician assignment who will do the admission and give admitting orders.  SIGNED: Clint Lipps, MD [facility]  09/05/2013, 12:02 PM

## 2013-09-05 NOTE — H&P (Signed)
FMTS Attending Admit Note Patient seen and examined by me in conjunction with Dr Pollie Meyer, I have reviewed her H&P and I agree with her assessment and plan.  Briefly, 85yoF with Parkinsons Disease, recently has had several falls at home that do not appear to be pre-syncopal in nature.  Latest fall this morning while on her way to dining room table, fell and was found by her husband nearly 2 hours after fall.  She is adamant that she did not have LOC.  Has had pain along her L hip, but she states she has borne weight on the L leg since the most recent fall.  Had struck the L occiput, is insistent that she will not remove her wig to allow [place] to examine her.  On exam she is alert, oriented to place and date, no apparent distress. Neck supple.  Cogwheeling rigidity in elbows with passive ROM. COR S1S2; loud 2/6 Systolic M over aortic area.  PULM Clear bilaterally.  EXAM: No ecchymosis over L hip; L leg is no everted.  Palpable dp pulses bilaterally; sensation in feet grossly intact bilaterally.  A/P: Recent falls: I agree with orthostatic BPs, telemetry monitoring.  Would consider ECHO given finding of Aortic M and prior ECHO Nov 22, 2012 did not comment on aortic valvular disease (mild pulmonary HTN noted on that study).  Regarding apparent acute kidney injury, agree with gently hydrating and following in the morning.  PT/OT evaluation; patient lives independently with her husband at this time. Paula Compton, MD

## 2013-09-05 NOTE — ED Notes (Signed)
Patient transported to X-ray & CT °

## 2013-09-05 NOTE — ED Notes (Signed)
Fell during the night states she was restless and unable to sleep was getting up to go play games on computer. She has Parkinson's and has been falling more recently. Pt states she  Hit her head and her left hip. Bruising noted left hip left wrist and forearm denies loss of consciousness. Husband was sleeping and she lay in floor 3-4 hours before he found her

## 2013-09-05 NOTE — H&P (Signed)
Family Medicine Teaching [facility] Admission History and Physical Service Pager: [phone]  Patient name: [name] Medical record number: [id number] Date of birth: [birth year] Age: 79 y.o. Gender: female  Primary Care Provider: Abbe Amsterdam, MD Consultants: none Code Status: Did not discuss with patient, so will presume full code. (I do see she has been DNR in the past). Will be sure to address this tomorrow.  Chief Complaint: frequent falls at home  Assessment and Plan: [name] is a 79 y.o. female presenting with frequent falls and acute kidney injury . PMH is significant for chronic renal failure and Parkinson's.  # Frequent falls: Admit to telemetry for monitoring. EKG unremarkable thus far. She has already had a large imaging workup of much of her body, with no significant acute findings. It is likely that the frequency of her falls is increased due to worsening of her chronic Parkinson's. I have reviewed her recent office visit note from her neurologist. -Will cycle troponins to ensure no cardiac event, although doubt this has happened. -Check orthostatic vital signs -PT/OT evaluations ordered. -Up with assistance only while in the hospital  # Acute kidney injury: Baseline appears to be around 1.4, currently 2.4. - Possibly prerenal from decreased PO intake, so will gently hydrate with 75 cc an hour of normal saline overnight - Recheck creatinine in the morning - Hold home Lasix for now while hydrating overnight - urinalysis unremarkable - If creatinine remains elevated, will likely order renal ultrasound.  # Diabetes: - moderate SSI QAC & HS  # Hypertension - Continue home amlodipine  # Parkinson's: - Continue home Sinemet and amantadine.  # Coronary artery disease: - Continue home aspirin 81 mg daily  # History of Barrett's esophagus: - Continue home famotidine and protonix  FEN/GI: Carb modified diet, normal saline at 75 cc/hr Prophylaxis:  Subcutaneous heparin  Disposition: Admit to telemetry for workup of falls, PT evaluation, monitoring of renal function.   History of Present Illness: [name] is a 79 y.o. female presenting with a fall at home. Patient reports having frequently fallen recently, as much as 50 times in the last 2 months. She states she fell at 3 AM this morning. She laid under her dining room table, and was unable to get up until her husband woke up approximately 11 hour and 45 minutes later. She does have an emergency button she wears, and tried to push it, but the battery was dead. She did hit her head against the floor, but did not lose consciousness. She denies having had any chest pain or palpitations prior to the fall. She does endorse occasional shortness of breath with heavy exertion. She ambulates with a rolling walker at home. She denies any recent fevers/chills, cough, abdominal pain, nausea, vomiting, or diarrhea. She states she has been losing weight over the last year, and does not know why. She does normally drink well.  Review Of Systems: Per HPI, otherwise 12 point review of systems was performed and was unremarkable.  Patient Active Problem List   Diagnosis Date Noted  . Right inguinal hernia 07/13/2013  . Recurrent UTI 01/20/2013  . Hiatal hernia 11/21/2012  . Dyspnea on exertion 11/21/2012  . Community acquired pneumonia 11/20/2012  . Depression 09/27/2012  . Memory loss 09/27/2012  . Gait disturbance 09/27/2012  . Candida UTI 09/27/2012  . Hypotension, Orthostatic 09/27/2012  . Confusion 09/27/2012  . Bradycardia 05/23/2012  . Constipation 04/25/2012  . Hypokalemia 04/22/2012  . Encephalopathy acute 04/21/2012  . Hypercalcemia  04/21/2012  . Renal failure (ARF), acute on chronic 04/21/2012  . Dehydration 04/21/2012  . HTN (hypertension) 04/21/2012  . Hyperlipidemia 04/21/2012  . DM (diabetes mellitus) 04/21/2012  . Parkinson's disease 04/21/2012  . CAD (coronary artery disease)  04/21/2012  . Barrett esophagus 04/21/2012  . PUD (peptic ulcer disease) 04/21/2012  . Adrenal insufficiency 04/21/2012  . Raynaud's disease 04/21/2012  . Anxiety 04/21/2012  . Fibromyalgia 04/21/2012  . Hx of bladder cancer 04/21/2012  . Edema of lower extremity 04/21/2012  . Falls 04/21/2012    Past Medical History: Past Medical History  Diagnosis Date  . MI, old   . Parkinson disease   . Scoliosis   . Arthritis   . Bladder cancer   . HTN (hypertension) 04/21/2012  . Hyperlipidemia 04/21/2012  . DM (diabetes mellitus) 04/21/2012  . CAD (coronary artery disease) 04/21/2012  . Barrett esophagus 04/21/2012  . PUD (peptic ulcer disease) 04/21/2012  . Adrenal insufficiency 04/21/2012  . Raynaud's disease 04/21/2012  . Anxiety 04/21/2012  . Fibromyalgia 04/21/2012  . Hx of bladder cancer 04/21/2012  . Edema of lower extremity 04/21/2012    Chronic lower extremity edema  . Falls 04/21/2012  . Recurrent UTI 01/20/2013   Past Surgical History: Past Surgical History  Procedure Laterality Date  . Tonsillectomy    . Appendectomy    . Abdominal hysterectomy     Social History: History  Substance Use Topics  . Smoking status: Former Smoker    Types: Cigarettes  . Smokeless tobacco: Never Used  . Alcohol Use: No  Denies using alcohol, tobacco, or recreational drugs.  Please also refer to relevant sections of EMR.  Family History: Family History  Problem Relation Age of Onset  . Heart disease Mother   . Diabetes Mother   . Bladder Cancer Maternal Aunt    Allergies and Medications: Allergies  Allergen Reactions  . Mirtazapine   . Codeine Hives  . Latex    No current facility-administered medications on file prior to encounter.   Current Outpatient Prescriptions on File Prior to Encounter  Medication Sig Dispense Refill  . amantadine (SYMMETREL) 100 MG capsule Take 1 capsule (100 mg total) by mouth daily.  90 capsule  3  . amLODipine (NORVASC) 10 MG tablet Take 10 mg by  mouth every evening.      Marland Kitchen aspirin 81 MG tablet Take 81 mg by mouth daily.      . carbidopa-levodopa (SINEMET IR) 25-100 MG per tablet Take 1 tablet by mouth 3 (three) times daily. At 10 am, 2 pm and 6 pm  270 tablet  3  . famotidine (PEPCID) 20 MG tablet Take 1 tablet (20 mg total) by mouth 2 (two) times daily.  60 tablet  0  . feeding supplement (ENSURE COMPLETE) LIQD Take 237 mLs by mouth daily at 3 pm.      . FLUZONE HIGH-DOSE injection Inject 0.5 mLs into the muscle once.       . furosemide (LASIX) 40 MG tablet Take 40 mg by mouth daily.      . Multiple Vitamins-Minerals (OCUVITE PO) Take 1 tablet by mouth every evening.      . Omega-3 Fatty Acids (FISH OIL) 1000 MG CAPS Take 1,000 mg by mouth every evening.      Marland Kitchen OVER THE COUNTER MEDICATION Place 2 drops into both eyes daily as needed. Lubricant eye drops for dry eyes      . pantoprazole (PROTONIX) 40 MG tablet Take 1 tablet (40 mg total)  by mouth 2 (two) times daily.  60 tablet  0  . polyethylene glycol powder (GLYCOLAX/MIRALAX) powder Take 17 g by mouth daily.  527 g  1  . senna (SENOKOT) 8.6 MG TABS tablet Take 1 tablet (8.6 mg total) by mouth daily as needed.  120 each  0  . temazepam (RESTORIL) 30 MG capsule Take 30 mg by mouth at bedtime.      . triamcinolone cream (KENALOG) 0.1 % Apply topically 2 (two) times daily.  30 g  1    Objective: BP 140/80  Pulse 66  Temp(Src) 97 F (36.1 C) (Oral)  Resp 16  Ht 4\' 8"  (1.422 m)  Wt 106 lb (48.081 kg)  BMI 23.78 kg/m2  SpO2 100% Exam: General: NAD, pleasant, cooperative HEENT: Normocephalic, atraumatic, and patient is wearing wig and refuses to remove it so that we can examine her scalp. Extraocular movements intact. Neck supple, no anterior cervical lymphadenopathy. Cardiovascular: Regular rate and rhythm, 3/6 systolic ejection murmur loudest at right upper sternal border, not radiating into carotids Respiratory: Clear to auscultation bilaterally, normal respiratory  effort Abdomen: Positive bowel sounds, soft, nontender to palpation, nondistended, no masses Extremities: Bilateral lower extremities without edema, surgical absence of right second toe, no foot wounds noted. Tender over left lateral hip. Skin: Normal appearing Neuro: Grossly nonfocal, speech intact, alert and oriented x3. She does have cogwheel rigidity of her arms. She also has a pill rolling tremor at rest, but compensates by holding onto things frequently.  Labs and Imaging: CBC BMET   Recent Labs Lab 09/05/13 0908  WBC 7.5  HGB 10.0*  HCT 30.8*  PLT 316    Recent Labs Lab 09/05/13 0908  NA 143  K 4.4  CL 103  CO2 28  BUN 58*  CREATININE 2.40*  GLUCOSE 105*  CALCIUM 9.9     Urinalysis    Component Value Date/Time   COLORURINE YELLOW 09/05/2013 1000   APPEARANCEUR CLEAR 09/05/2013 1000   LABSPEC 1.013 09/05/2013 1000   PHURINE 6.5 09/05/2013 1000   GLUCOSEU NEGATIVE 09/05/2013 1000   HGBUR NEGATIVE 09/05/2013 1000   BILIRUBINUR NEGATIVE 09/05/2013 1000   BILIRUBINUR neg 08/06/2013 1522   KETONESUR NEGATIVE 09/05/2013 1000   PROTEINUR NEGATIVE 09/05/2013 1000   UROBILINOGEN 0.2 09/05/2013 1000   UROBILINOGEN 0.2 08/06/2013 1522   NITRITE NEGATIVE 09/05/2013 1000   NITRITE neg 08/06/2013 1522   LEUKOCYTESUR NEGATIVE 09/05/2013 1000     CT Abd Pelvis Wo Contrast: No acute findings in the abdomen/pelvis.  The mild free fluid over the right pelvis extending into the inguinal canal. Subcutaneous edema over the posterior lateral left gluteal region and left hip.  Moderate size hiatal hernia.  Atherosclerotic disease of the aortoiliac system.  Moderate degenerative change of the spine with curvature convex to the right. Old right pelvic fracture.  CT Chest Wo Contrast: No acute findings in the chest.  Bilateral old rib fractures.  Mild cardiomegaly with small pericardial effusion.  Stable moderate size hiatal hernia.  Moderate atherosclerotic coronary artery disease.    CXR  1. Stable moderate cardiomegaly. Stable chronic scar/atelectasis at  the right lung base associated with chronic elevation of the right  hemidiaphragm. No acute cardiopulmonary disease.  2. Further displacement of the previously identified left 7th  through 10th rib fractures. This could certainly have happened at  the time of the current fall if there is point tenderness on  physical examination in this region.  L Wrist Xray: Old distal left radial metaphyseal  and ulnar styloid fractures.  No definite acute osseous findings.   CT Cervical Spine w/o Contrast: Moderate spondylosis with multilevel degenerative disk disease and  degenerative subluxations as described. No acute fracture.  Borderline canal stenosis at the C5-6 level due to posterior  osteophyte formation.   L hand Xray: Osseous demineralization with old healed distal radial metaphysis seal fracture.  Old ulnar styloid fracture.  Healing fracture at base of 5th metacarpal.  No definite acute bony abnormalities.  CT Head Wo Contrast: No acute intracranial findings. Small left posterior parietal scalp contusion.  Chronic ischemic microvascular disease.   Latrelle Dodrill, MD 09/05/2013, 4:04 PM PGY-2, [place] [facility] FPTS Intern pager: [phone], text pages welcome

## 2013-09-06 ENCOUNTER — Encounter (HOSPITAL_COMMUNITY): Payer: Self-pay | Admitting: General Practice

## 2013-09-06 LAB — BASIC METABOLIC PANEL
BUN: 45 mg/dL — ABNORMAL HIGH (ref 6–23)
Calcium: 8.4 mg/dL (ref 8.4–10.5)
Chloride: 106 mEq/L (ref 96–112)
Creatinine, Ser: 1.75 mg/dL — ABNORMAL HIGH (ref 0.50–1.10)
GFR calc Af Amer: 30 mL/min — ABNORMAL LOW (ref >90)
GFR calc non Af Amer: 26 mL/min — ABNORMAL LOW (ref >90)

## 2013-09-06 LAB — GLUCOSE, CAPILLARY
Glucose-Capillary: 85 mg/dL (ref 70–99)
Glucose-Capillary: 106 mg/dL — ABNORMAL HIGH (ref 70–99)
Glucose-Capillary: 103 mg/dL — ABNORMAL HIGH (ref 70–99)

## 2013-09-06 LAB — CBC
HCT: 26.2 % — ABNORMAL LOW (ref 36.0–46.0)
MCHC: 33.2 g/dL (ref 30.0–36.0)
MCV: 87 fL (ref 78.0–100.0)
Platelets: 284 10*3/uL (ref 150–400)
RBC: 3.01 MIL/uL — ABNORMAL LOW (ref 3.87–5.11)
RDW: 15.2 % (ref 11.5–15.5)

## 2013-09-06 LAB — TROPONIN I: Troponin I: <0.3 ng/mL (ref <0.30)

## 2013-09-06 MED ORDER — TEMAZEPAM 15 MG PO CAPS
15.0000 mg | ORAL_CAPSULE | Freq: Every day | ORAL | Status: DC
  Administered 2013-09-06: 15 mg via ORAL
  Filled 2013-09-06: qty 1

## 2013-09-06 NOTE — Progress Notes (Signed)
FMTS Attending NOte Patient seen and examined by me, discussed with resident team and I agree with Dr Whitman Hero assessment and plan.  Patient with no new falls. AKI appears to be improving with fluids.  For PT/OT evaluation. She has a notable systolic murmur at aortic area, would consider ECHO as part of her workup as well (previous ECHO Jan 2014 did not mention aortic valvular disease).  Agree with incremental decrease in her BNZ nightly dosing, as it (along with autonomic dysfunction and gait d/o from Parkinsonism) may be contributing to falls. Vitamin D level in setting of old fx.  [name] Compton, MD

## 2013-09-06 NOTE — Evaluation (Addendum)
Physical Therapy Evaluation Patient Details Name: ZALIKA TIESZEN MRN: [id number] DOB: 08-Aug-1928 Today's Date: 09/06/2013 Time: 5638-7564 PT Time Calculation (min): 27 min  PT Assessment / Plan / Recommendation History of Present Illness   Pt is 79 y/o female with history of Parkinson disease and multiple falls.  Pt given to the hospital because she fell at home, she was on the floor for about 3 hours.  X-rays did not show new fractures, but multiple old healing/healed fractures.  There is hip hematoma but no fracture.  She also does not have acute renal failure.  Patient admitted to the hospital for further evaluation.    Clinical Impression  Patient has decrease function in ambulation and functional activities (see PT problems list).  Spoke with pt and pt's husband in depth re: Outpatient PT for parkinson's program.  Pt and pt's husband are very interested and would like more information about the program.  They are currently refusing HHPT services at this time however agreeable to outpatient PT.  The pt has a supportive husband who can increase the amount of household assistance as necessary due to the increased amount of falls (around 10/week).  The patient shows excellent rehab potential due to her ability to work hard.    PT Assessment  Patient needs continued PT services    Follow Up Recommendations   Outpatient PT (Parkinson's Program if possible)       Equipment Recommendations  Rolling walker with 5" wheels    Frequency Min 4X/week    Precautions / Restrictions Precautions Precautions: Fall Precaution Comments: Pt's huband states that she has fallen 11 times in a week at home Restrictions Weight Bearing Restrictions: No Fall Risk  Pertinent Vitals/Pain No pain reported      Mobility  Bed Mobility Bed Mobility: Not assessed Details for Bed Mobility Assistance: pt up in recliner Transfers Transfers: Sit to Stand Sit to Stand: 3: Mod assist;From chair/3-in-1;From  toilet;With upper extremity assist Stand to Sit: To chair/3-in-1;To toilet;3: Mod assist;With upper extremity assist Details for Transfer Assistance: mod verbal and tactile cuing to achieve upright posture, cues for correct hand placement Ambulation/Gait Ambulation/Gait Assistance: 4: Min assist Ambulation Distance (Feet): 10 Feet Assistive device: Rolling walker Ambulation/Gait Assistance Details: 2x10 with 5 min rest break between Gait Pattern: Step-to pattern;Decreased stride length General Gait Details: short choppy steps with festinating gait    Exercises     PT Diagnosis: Difficulty walking;Abnormality of gait;Generalized weakness  PT Problem List: Decreased strength;Decreased activity tolerance;Decreased balance;Decreased mobility;Decreased coordination;Decreased knowledge of use of DME PT Treatment Interventions: DME instruction;Gait training;Stair training;Functional mobility training;Therapeutic activities;Therapeutic exercise;Balance training;Neuromuscular re-education     PT Goals(Current goals can be found in the care plan section) Acute Rehab PT Goals Patient Stated Goal: stop falls PT Goal Formulation: With family Potential to Achieve Goals: Good  Visit Information  Assistance Needed: +1 History of Present Illness: Pt is a 79 y.o. female presenting with frequent falls and acute kidney injury . PMH is significant for chronic renal failure and Parkinson's       Prior Functioning  Home Living Family/patient expects to be discharged to:: Private residence Living Arrangements: Spouse/significant other Available Help at Discharge: Family;Available 24 hours/day Type of Home: Other(Comment) (condo) Home Access: Level entry Home Layout: One level Home Equipment: Shower seat - built in;Walker - 4 wheels;Wheelchair - manual Prior Function Level of Independence: Needs assistance Comments: spouse is healthy and able to assist pt Communication Communication: HOH Dominant  Hand: Right    Cognition  Cognition Arousal/Alertness: Awake/alert Behavior During Therapy: WFL for tasks assessed/performed Overall Cognitive Status: Within Functional Limits for tasks assessed Area of Impairment: Safety/judgement Safety/Judgement: Decreased awareness of safety;Decreased awareness of deficits    Extremity/Trunk Assessment Upper Extremity Assessment Upper Extremity Assessment: Overall WFL for tasks assessed;Generalized weakness Lower Extremity Assessment Lower Extremity Assessment: Defer to PT evaluation Cervical / Trunk Assessment Cervical / Trunk Assessment: Kyphotic   Balance Balance Balance Assessed: Yes Static Sitting Balance Static Sitting - Balance Support: Feet supported Static Sitting - Level of Assistance: 5: Stand by assistance Static Sitting - Comment/# of Minutes: 5 Dynamic Sitting Balance Dynamic Sitting - Balance Support: Feet supported;During functional activity Dynamic Sitting - Level of Assistance: 5: Stand by assistance Dynamic Standing Balance Dynamic Standing - Balance Support: Left upper extremity supported;During functional activity Dynamic Standing - Level of Assistance: 4: Min assist;3: Mod assist  End of Session    GP     Solmon Ice, SPT [phone]  Edinson Domeier 09/06/2013, 5:28 PM  Jake Shark, PT DPT [phone]

## 2013-09-06 NOTE — Progress Notes (Signed)
Family Medicine Teaching Service Daily Progress Note Intern Pager: [phone]  Patient name: [name] Medical record number: [id number] Date of birth: 09-30-1928 Age: 79 y.o. Gender: female  Primary Care Provider: Abbe Amsterdam, MD Consultants: None Code Status: Did not discuss with patient, so will presume full code. (I do see she has been DNR in the past). Will be sure to address this tomorrow.  Pt Overview and Major Events to Date:  11/3: Falls; AKI Cr 2.4 (baseline 1.4); Trop neg x 3; Multiple old fractures   Assessment and Plan: [name] is a 79 y.o. female presenting with frequent falls and acute kidney injury. PMH is significant for chronic renal failure and Parkinson's.   # Frequent falls: EKG unremarkable thus far. She has already had a large imaging workup of much of her body, with no significant acute findings. It is likely that the frequency of her falls is increased due to worsening of her chronic Parkinson's. I have reviewed her recent office visit note from her neurologist.  - Multiple old fractures  - previously identified left 7th through 10th rib fractures w/ Further displacement - PT/OT evaluations ordered.  - Up with assistance only while in the hospital  - telemetry: No events - troponins neg x 3  - Check orthostatic vital signs today - Check Vit D level - Decrease Restoril to 15mg  qhs  # Acute kidney injury: Baseline appears to be around 1.4, currently 2.4. urinalysis unremarkable  - Possibly prerenal from decreased PO intake, so will gently hydrate with 75 cc an hour of normal saline overnight  - Cr: 11/4: 1.75 < (Admit 2.4) - Hold home Lasix for now while hydrating overnight   # Diabetes:  - moderate SSI QAC & HS   # Hypertension  - Continue home amlodipine   # Parkinson's:  - Continue home Sinemet and amantadine.   # Coronary artery disease:  - Continue home aspirin 81 mg daily   # History of Barrett's esophagus:  - Continue home  famotidine and protonix   FEN/GI: Carb modified diet, normal saline at 75 cc/hr  Prophylaxis: Subcutaneous heparin   Disposition: telemetry for workup of falls, PT evaluation, monitoring of renal function.   Subjective: Continue to endorse pain in left hip, but reports that it is improving. Says she is aware she need PT and is considering her options for home vs inpatient rehab.   Objective: Temp:  [95.6 F (35.3 C)-97.8 F (36.6 C)] 97.8 F (36.6 C) (11/04 0536) Pulse Rate:  [57-99] 57 (11/04 0536) Resp:  [16-17] 17 (11/04 0536) BP: (116-161)/(57-80) 125/63 mmHg (11/04 0536) SpO2:  [98 %-100 %] 98 % (11/04 0536) Weight:  [106 lb (48.081 kg)] 106 lb (48.081 kg) (11/03 0839) Physical Exam: General: NAD, pleasant, cooperative  Cardiovascular: Regular rate and rhythm, 3/6 systolic ejection murmur loudest at right upper sternal border, not radiating into carotids  Respiratory: Clear to auscultation bilaterally, normal respiratory effort  Abdomen: Positive bowel sounds, soft, nontender to palpation, nondistended, no masses  Extremities: Bilateral lower extremities without edema, surgical absence of right second toe, no foot wounds noted. MSK: Tender over left lateral hip.   Laboratory:  Recent Labs Lab 09/05/13 0908 09/06/13 0100  WBC 7.5 6.4  HGB 10.0* 8.7*  HCT 30.8* 26.2*  PLT 316 284    Recent Labs Lab 09/05/13 0908 09/06/13 0100  NA 143 140  K 4.4 3.9  CL 103 106  CO2 28 25  BUN 58* 45*  CREATININE 2.40* 1.75*  CALCIUM 9.9 8.4  PROT 7.0  --   BILITOT 0.4  --   ALKPHOS 109  --   ALT 8  --   AST 25  --   GLUCOSE 105* 127*   Imaging/Diagnostic Tests: CT Abd Pelvis Wo Contrast:  No acute findings in the abdomen/pelvis.  The mild free fluid over the right pelvis extending into the inguinal canal. Subcutaneous edema over the posterior lateral left gluteal region and left hip.  Moderate size hiatal hernia.  Atherosclerotic disease of the aortoiliac system.   Moderate degenerative change of the spine with curvature convex to the right. Old right pelvic fracture.  CT Chest Wo Contrast:  No acute findings in the chest.  Bilateral old rib fractures.  Mild cardiomegaly with small pericardial effusion.  Stable moderate size hiatal hernia.  Moderate atherosclerotic coronary artery disease.  CXR  1. Stable moderate cardiomegaly. Stable chronic scar/atelectasis at  the right lung base associated with chronic elevation of the right  hemidiaphragm. No acute cardiopulmonary disease.  2. Further displacement of the previously identified left 7th  through 10th rib fractures. This could certainly have happened at  the time of the current fall if there is point tenderness on  physical examination in this region.  L Wrist Xray:  Old distal left radial metaphyseal and ulnar styloid fractures.  No definite acute osseous findings.  CT Cervical Spine w/o Contrast:  Moderate spondylosis with multilevel degenerative disk disease and  degenerative subluxations as described. No acute fracture.  Borderline canal stenosis at the C5-6 level due to posterior  osteophyte formation.  L hand Xray:  Osseous demineralization with old healed distal radial metaphysis seal fracture.  Old ulnar styloid fracture.  Healing fracture at base of 5th metacarpal.  No definite acute bony abnormalities.  CT Head Wo Contrast:  No acute intracranial findings. Small left posterior parietal scalp contusion.  Chronic ischemic microvascular disease.  Wenda Low, MD 09/06/2013, 6:26 AM PGY-1, [facility] [facility] FPTS Intern pager: [phone], text pages welcome

## 2013-09-06 NOTE — Evaluation (Signed)
Occupational Therapy Evaluation Patient Details Name: [name] MRN: [id number] DOB: 06-30-1928 Today's Date: 09/06/2013 Time: 8295-6213 OT Time Calculation (min): 41 min  OT Assessment / Plan / Recommendation History of present illness Pt is a 79 y.o. female presenting with frequent falls and acute kidney injury . PMH is significant for chronic renal failure and Parkinson's   Clinical Impression   Pt demos decline in function with ADLs and ADL mobility safety and would benefit from acute OT services to address impairments to help restore PLOF to return home safely. Educated and discussed with pt and her husband extensively the importance of safety and for her to allow her husband to stand beside her when walking and getting in and out of shower and the risks/consequences of falls    OT Assessment  Patient needs continued OT Services    Follow Up Recommendations  Home health OT;SNF;Other (comment);Outpatient OT (OP neuro rehab? Pt/husband would like more info on OP neuro rehab)    Barriers to Discharge   None, pt ans husband unsure if they would like further therapy in OP neuro setting vs HH, PT to provide more info on OP neuro rehab  Equipment Recommendations  None recommended by OT    Recommendations for Other Services    Frequency  Min 2X/week    Precautions / Restrictions Precautions Precautions: Fall Precaution Comments: Pt's huband states that she has fallen 11 times in a week at home Restrictions Weight Bearing Restrictions: No   Pertinent Vitals/Pain No c/o pain    ADL  Grooming: Performed;Wash/dry hands;Wash/dry face;Minimal assistance Where Assessed - Grooming: Supported standing Upper Body Bathing: Simulated;Supervision/safety;Set up Where Assessed - Upper Body Bathing: Unsupported sitting Lower Body Bathing: Simulated;Moderate assistance Where Assessed - Lower Body Bathing: Supported sit to stand;Unsupported sitting Upper Body Dressing:  Performed;Supervision/safety;Set up Lower Body Dressing: Performed;Moderate assistance;Maximal assistance Toilet Transfer: Performed;Moderate assistance Toilet Transfer Method: Sit to stand Toilet Transfer Equipment: Regular height toilet;Grab bars Toileting - Clothing Manipulation and Hygiene: Minimal assistance;Moderate assistance Where Assessed - Toileting Clothing Manipulation and Hygiene: Standing Tub/Shower Transfer Method: Not assessed Equipment Used: Gait belt;Rolling walker Transfers/Ambulation Related to ADLs: mod verbal and tactile cuing to achieve upright posture, cues for correct hand placement ADL Comments: Pt's husband states that at home she is independent with bathing and dressing after set up , but she will not allow him to help her step over into/out of shower or supervise her for safety    OT Diagnosis: Generalized weakness  OT Problem List: Decreased strength;Decreased knowledge of use of DME or AE;Impaired balance (sitting and/or standing);Decreased safety awareness OT Treatment Interventions: Self-care/ADL training;Balance training;Therapeutic exercise;Neuromuscular education;Therapeutic activities;DME and/or AE instruction;Patient/family education   OT Goals(Current goals can be found in the care plan section) Acute Rehab OT Goals Patient Stated Goal: stop falls OT Goal Formulation: With patient/family Time For Goal Achievement: 09/13/13 Potential to Achieve Goals: Good ADL Goals Pt Will Perform Grooming: with min guard assist;standing Pt Will Perform Lower Body Bathing: with min assist;sitting/lateral leans;sit to/from stand Pt Will Perform Lower Body Dressing: with mod assist;with min assist;sit to/from stand Pt Will Transfer to Toilet: with min assist;with min guard assist;ambulating;grab bars Pt Will Perform Toileting - Clothing Manipulation and hygiene: with min guard assist;sit to/from stand Pt Will Perform Tub/Shower Transfer: with mod assist;with min  assist;shower seat;ambulating  Visit Information  Last OT Received On: 09/06/13 Assistance Needed: +1 PT/OT Co-Evaluation/Treatment: Yes History of Present Illness: Pt is a 79 y.o. female presenting with frequent falls and acute kidney  injury . PMH is significant for chronic renal failure and Parkinson's       Prior Functioning     Home Living Family/patient expects to be discharged to:: Private residence Living Arrangements: Spouse/significant other Available Help at Discharge: Family;Available 24 hours/day Type of Home: Other(Comment) (condo) Home Access: Level entry Home Layout: One level Home Equipment: Shower seat - built in;Walker - 4 wheels;Wheelchair - manual Prior Function Level of Independence: Needs assistance Comments: spouse is healthy and able to assist pt Communication Communication: HOH Dominant Hand: Right         Vision/Perception Vision - History Baseline Vision: Wears glasses only for reading Patient Visual Report: No change from baseline Perception Perception: Within Functional Limits   Cognition  Cognition Arousal/Alertness: Awake/alert Behavior During Therapy: WFL for tasks assessed/performed Overall Cognitive Status: Within Functional Limits for tasks assessed Area of Impairment: Safety/judgement Safety/Judgement: Decreased awareness of safety;Decreased awareness of deficits    Extremity/Trunk Assessment Upper Extremity Assessment Upper Extremity Assessment: Overall WFL for tasks assessed;Generalized weakness Lower Extremity Assessment Lower Extremity Assessment: Defer to PT evaluation Cervical / Trunk Assessment Cervical / Trunk Assessment: Kyphotic     Mobility Bed Mobility Bed Mobility: Not assessed Details for Bed Mobility Assistance: pt up in recliner Transfers Transfers: Sit to Stand;Stand to Sit Sit to Stand: 3: Mod assist;From chair/3-in-1;From toilet;With upper extremity assist Stand to Sit: To chair/3-in-1;To toilet;3: Mod  assist;With upper extremity assist Details for Transfer Assistance: mod verbal and tactile cuing to achieve upright posture, cues for correct hand placement     Exercise     Balance Balance Balance Assessed: Yes Static Sitting Balance Static Sitting - Balance Support: Feet supported Static Sitting - Level of Assistance: 5: Stand by assistance Static Sitting - Comment/# of Minutes: 5 Dynamic Sitting Balance Dynamic Sitting - Balance Support: Feet supported;During functional activity Dynamic Sitting - Level of Assistance: 5: Stand by assistance Dynamic Standing Balance Dynamic Standing - Balance Support: Left upper extremity supported;During functional activity Dynamic Standing - Level of Assistance: 4: Min assist;3: Mod assist   End of Session OT - End of Session Equipment Utilized During Treatment: Gait belt;Rolling walker Activity Tolerance: Patient tolerated treatment well Patient left: in chair;with family/visitor present  GO     Galen Manila 09/06/2013, 3:26 PM

## 2013-09-07 DIAGNOSIS — I369 Nonrheumatic tricuspid valve disorder, unspecified: Secondary | ICD-10-CM

## 2013-09-07 LAB — CBC
HCT: 25.6 % — ABNORMAL LOW (ref 36.0–46.0)
MCHC: 32.8 g/dL (ref 30.0–36.0)
MCV: 88 fL (ref 78.0–100.0)
Platelets: 277 10*3/uL (ref 150–400)
RBC: 2.91 MIL/uL — ABNORMAL LOW (ref 3.87–5.11)
RDW: 15.2 % (ref 11.5–15.5)
WBC: 5.3 10*3/uL (ref 4.0–10.5)

## 2013-09-07 LAB — BASIC METABOLIC PANEL
Calcium: 9 mg/dL (ref 8.4–10.5)
Chloride: 113 mEq/L — ABNORMAL HIGH (ref 96–112)
Creatinine, Ser: 1.47 mg/dL — ABNORMAL HIGH (ref 0.50–1.10)
GFR calc Af Amer: 37 mL/min — ABNORMAL LOW (ref >90)
Glucose, Bld: 71 mg/dL (ref 70–99)
Sodium: 144 mEq/L (ref 135–145)

## 2013-09-07 LAB — GLUCOSE, CAPILLARY
Glucose-Capillary: 71 mg/dL (ref 70–99)
Glucose-Capillary: 91 mg/dL (ref 70–99)
Glucose-Capillary: 174 mg/dL — ABNORMAL HIGH (ref 70–99)

## 2013-09-07 MED ORDER — TEMAZEPAM 30 MG PO CAPS: 30.0000 mg | ORAL_CAPSULE | Freq: Every evening | ORAL | Status: DC | PRN

## 2013-09-07 MED ORDER — TEMAZEPAM 7.5 MG PO CAPS: 7.5000 mg | ORAL_CAPSULE | Freq: Every day | ORAL | Status: DC

## 2013-09-07 NOTE — Discharge Summary (Signed)
Family Medicine Teaching [facility] Discharge Summary  Patient name: [name] Medical record number: [id number] Date of birth: 1928/09/13 Age: 79 y.o. Gender: female Date of Admission: 09/05/2013  Date of Discharge: 09/07/13 Admitting Physician: No admitting provider for patient encounter.  Primary Care Provider: Abbe Amsterdam, MD Consultants: None  Indication for Hospitalization: Fall  Discharge Diagnoses/Problem List:  Patient Active Problem List   Diagnosis Date Noted  . Memory loss 09/27/2012  . Gait disturbance 09/27/2012  . Hypotension, Orthostatic 09/27/2012  . Renal failure (ARF), acute on chronic 04/21/2012  . Dehydration 04/21/2012  . HTN (hypertension) 04/21/2012  . Hyperlipidemia 04/21/2012  . DM (diabetes mellitus) 04/21/2012  . Parkinson's disease 04/21/2012  . CAD (coronary artery disease) 04/21/2012  . Falls 04/21/2012   Disposition: home  Discharge Condition: stable  Brief Hospital Course: [name] is a 79 y.o. female presented with frequent falls and acute kidney injury. PMH is significant for chronic renal failure and Parkinson's. She had already had a large imaging workup of much of her body, with no significant acute findings from previous falls. It is likely that the frequency of her falls is increased due to worsening of her chronic Parkinson's. PT & OT recommended home health. Orthostatic blood pressure were positive: 143/45 HR67 Sitting, 122/98 HR94 Standing; likely due to autonomic dysfunction associated with Parkinson's. Restoril was weaned and discontinued at discharge due to fall risk. Additional work-up for falls was negative: Troponins negative; No events on telemetry. She was noticed to have AKI likely due to dehydration. Cr 1.47 was improving at discharge from 2.4 on admission. Her Lasix was held due to dehydration, and not restarted at discharge due to BP and autonomic dysfunction. She was found to have heart murmur not previously  documented and ECHO was obtained.   Issues for Follow Up:  1. Consider switch Restoril to another sleep aid due to Fall Risk 2. Lasix held and stopped at discharge due to orthostatic hypotension and likely autonomic dysfunction 1. may need to restart if edema returns 3. Ensuring PT & OT Home health was established 4. Vit D level pending at discharge  Significant Procedures:  ECHO Study Conclusions - Left ventricle: The cavity size was normal. Systolic function was normal. The estimated ejection fraction was in the range of 55% to 60%. Wall motion was normal; there were no regional wall motion abnormalities. Doppler parameters are consistent with abnormal left ventricular relaxation (grade 1 diastolic dysfunction). - Aortic valve: Trivial regurgitation. - Mitral valve: Mild regurgitation. - Right ventricle: The cavity size was mildly dilated. Wall thickness was normal. - Tricuspid valve: Moderate regurgitation. - Pulmonary arteries: Systolic pressure was mildly to moderately increased. PA peak pressure: 54mm Hg (S).  Significant Labs and Imaging:   Recent Labs Lab 09/05/13 0908 09/06/13 0100 09/07/13 0615  WBC 7.5 6.4 5.3  HGB 10.0* 8.7* 8.4*  HCT 30.8* 26.2* 25.6*  PLT 316 284 277    Recent Labs Lab 09/05/13 0908 09/06/13 0100 09/07/13 0615  NA 143 140 144  K 4.4 3.9 4.2  CL 103 106 113*  CO2 28 25 24   GLUCOSE 105* 127* 71  BUN 58* 45* 32*  CREATININE 2.40* 1.75* 1.47*  CALCIUM 9.9 8.4 9.0  ALKPHOS 109  --   --   AST 25  --   --   ALT 8  --   --   ALBUMIN 3.9  --   --    CT Abd Pelvis Wo Contrast:  No acute findings in the  abdomen/pelvis.  The mild free fluid over the right pelvis extending into the inguinal canal. Subcutaneous edema over the posterior lateral left gluteal region and left hip.  Moderate size hiatal hernia.  Atherosclerotic disease of the aortoiliac system.  Moderate degenerative change of the spine with curvature convex to the right. Old  right pelvic fracture.  CT Chest Wo Contrast:  No acute findings in the chest.  Bilateral old rib fractures.  Mild cardiomegaly with small pericardial effusion.  Stable moderate size hiatal hernia.  Moderate atherosclerotic coronary artery disease.  CXR  1. Stable moderate cardiomegaly. Stable chronic scar/atelectasis at  the right lung base associated with chronic elevation of the right  hemidiaphragm. No acute cardiopulmonary disease.  2. Further displacement of the previously identified left 7th  through 10th rib fractures. This could certainly have happened at  the time of the current fall if there is point tenderness on  physical examination in this region.  L Wrist Xray:  Old distal left radial metaphyseal and ulnar styloid fractures.  No definite acute osseous findings.  CT Cervical Spine w/o Contrast:  Moderate spondylosis with multilevel degenerative disk disease and  degenerative subluxations as described. No acute fracture.  Borderline canal stenosis at the C5-6 level due to posterior  osteophyte formation.  L hand Xray:  Osseous demineralization with old healed distal radial metaphysis seal fracture.  Old ulnar styloid fracture.  Healing fracture at base of 5th metacarpal.  No definite acute bony abnormalities.  CT Head Wo Contrast:  No acute intracranial findings. Small left posterior parietal scalp contusion.  Chronic ischemic microvascular disease.  Results/Tests Pending at Time of Discharge: None  Discharge Medications:    Medication List    STOP taking these medications       furosemide 40 MG tablet  Commonly known as:  LASIX      TAKE these medications       amantadine 100 MG capsule  Commonly known as:  SYMMETREL  Take 100 mg by mouth daily.     amLODipine 10 MG tablet  Commonly known as:  NORVASC  Take 10 mg by mouth every evening.     aspirin 81 MG tablet  Take 81 mg by mouth daily.     carbidopa-levodopa 25-100 MG per tablet  Commonly  known as:  SINEMET IR  Take 1 tablet by mouth 3 (three) times daily.     famotidine 20 MG tablet  Commonly known as:  PEPCID  Take 20 mg by mouth 2 (two) times daily.     feeding supplement (ENSURE COMPLETE) Liqd  Take 237 mLs by mouth daily at 3 pm.     Fish Oil 1000 MG Caps  Take 1,000 mg by mouth every evening.     OCUVITE PO  Take 1 tablet by mouth every evening.     OVER THE COUNTER MEDICATION  Place 2 drops into both eyes daily as needed. Lubricant eye drops for dry eyes     pantoprazole 40 MG tablet  Commonly known as:  PROTONIX  Take 40 mg by mouth 2 (two) times daily.     temazepam 30 MG capsule  Commonly known as:  RESTORIL  Take 1 capsule (30 mg total) by mouth at bedtime as needed for sleep.     triamcinolone cream 0.1 %  Commonly known as:  KENALOG  Apply 1 application topically 2 (two) times daily.       Discharge Instructions: Please refer to Patient Instructions section of EMR for full  details.  Patient was counseled important signs and symptoms that should prompt return to medical care, changes in medications, dietary instructions, activity restrictions, and follow up appointments.   Follow-Up Appointments:   Follow-up Information   Schedule an appointment as soon as possible for a visit with Abbe Amsterdam, MD.   Specialty:  Family Medicine   Contact information:   [address] [place] [address] [phone]      Wenda Low, MD 09/07/2013, 11:47 AM PGY-1, [facility] Family [facility]

## 2013-09-07 NOTE — Progress Notes (Signed)
Patient discharged to home. Patient AVS reviewed with patient and patient's husband. Patient verbalized understanding of medications and follow-up appointments.  Patient remains stable; no signs or symptoms of distress.  Patient educated to return to the ER in cases of SOB, dizziness, fever, chest pain, or fainting.

## 2013-09-07 NOTE — Progress Notes (Signed)
   CARE MANAGEMENT NOTE 09/07/2013  Patient:  [name], [name]   Account Number:  [id number]  Date Initiated:  09/07/2013  Documentation initiated by:  Darlyne Russian  Subjective/Objective Assessment:   Patient admitted s/p fall at home c/0 hip pain, hit head, no fractures. She has frequent falls d/t Parkinson Disease.     Action/Plan:   Progression of care and discharge planning   Anticipated DC Date:  09/07/2013   Anticipated DC Plan:  HOME W HOME HEALTH SERVICES      DC Planning Services  CM consult      [facility] Choice  HOME HEALTH   Choice offered to / List presented to:          [facility] arranged  HH-3 OT  HH-2 PT      [facility] agency  [facility]   Status of service:  Completed, signed off Medicare Important Message given?   (If response is "NO", the following Medicare IM given date fields will be blank) Date Medicare IM given:   Date Additional Medicare IM given:    Discharge Disposition:  HOME W HOME HEALTH SERVICES  Per UR Regulation:    If discussed at Long Length of Stay Meetings, dates discussed:    Comments:  09/07/2013  [address] RN, [place]  [phone] CM referral:  home health PT, OT  Met with patient and spouse regarding discharge planning and home health PT/OT. They had HH in the past, the PT yesterday spoke about an outpatient PT program for Parkinson Patients. Discussed the patient's medical status and decreased endurance to participate in an outpatient program at this time, suggested starting with home PT and transition to outpatient program. Explained the HHPT/OT can assist them with any safety issues in the home, DME needs, teach safe measures for transfers to car and prevent injuries to patient or caregivers.  Patient agrees to home health services. Offered choice for agency, selected [place].  [facility] called with referral for home health PT, OT

## 2013-09-07 NOTE — Progress Notes (Signed)
  Echocardiogram 2D Echocardiogram has been performed.  [name] [name] 09/07/2013, 3:26 PM

## 2013-09-07 NOTE — Progress Notes (Signed)
FMTS Attending Note Patient seen and examined by me, discussed with Dr Gayla Doss and I agree with his assessment and plan.  Patient is sitting up eating lunch, husband in room.  Her back pain has resolved from earlier this morning.  She feels well and is ready to go home.  Patient's husband remarks that Dr. [name] of [facility] is Ms. Eliot Ford' primary physician, although she is seen sometimes at [facility] on [address].  He asks that her Discharge Summary be sent to Dr. Katrinka Blazing of [facility] for follow up planning.  Will need to follow up on ECHO and vitamin D level as part of hospital follow up.  Paula Compton, MD

## 2013-09-07 NOTE — Progress Notes (Signed)
Physical Therapy Treatment Patient Details Name: CAITLIN AINLEY MRN: [id number] DOB: Mar 23, 1928 Today's Date: 09/07/2013 Time: 1308-6578 PT Time Calculation (min): 21 min  PT Assessment / Plan / Recommendation  History of Present Illness Pt is a 79 y.o. female presenting with frequent falls and acute kidney injury . PMH is significant for chronic renal failure and Parkinson's   PT Comments   Pt continues to demo decreased independence with functional independence in mobility. Requires mod (A) to maintain balance and facilitate gt. Pt and husband had concerns and questions regarding D/C disposition. They are at this time agreeable to HHPT upon D/C home and are adamant about not wanting STSNF. Discussed patients's high fall risk with them and they continued to be adamant about HHPT vs ST SNF. Will continue to follow up with pt per POC.   Follow Up Recommendations  Home health PT;Supervision/Assistance - 24 hour;Supervision for mobility/OOB     Does the patient have the potential to tolerate intense rehabilitation     Barriers to Discharge        Equipment Recommendations  Rolling walker with 5" wheels;Other (comment) (pt refusing RW at this time )    Recommendations for Other Services    Frequency Min 4X/week   Progress towards PT Goals Progress towards PT goals: Progressing toward goals  Plan Discharge plan needs to be updated    Precautions / Restrictions Precautions Precautions: Fall Precaution Comments: Pt's huband states that she has fallen 11 times in a week at home Restrictions Weight Bearing Restrictions: No   Pertinent Vitals/Pain C/o back pain 8/10; RN notified.     Mobility  Bed Mobility Bed Mobility: Not assessed Details for Bed Mobility Assistance: pt up in recliner Transfers Transfers: Sit to Stand;Stand to Sit Sit to Stand: 3: Mod assist;With armrests;With upper extremity assist;From elevated surface;From chair/3-in-1 Stand to Sit: 3: Mod assist;To  chair/3-in-1;With armrests;With upper extremity assist Details for Transfer Assistance: pt with increased difficulty initiating and powering up; mod cues for safety and sequencing; pt requires incr (A) to maintain balance and prevent anterior fall; pt also requires (A) controling descent to chair; pt becomes fatigued and attempted to sit prior to reaching chair  Ambulation/Gait Ambulation/Gait Assistance: 3: Mod assist Ambulation Distance (Feet): 20 Feet Assistive device: Rolling walker Ambulation/Gait Assistance Details: pt with increased difficulty managing RW; especially around corners and obstacles; pt amb with narrow BOS and shuffled steps; mod (A) to maintain balance and (A) to facilitate rotation through hips to advance LEs  Gait Pattern: Step-to pattern;Decreased stride length;Shuffle;Trunk flexed;Narrow base of support Gait velocity: very decreased  Stairs: No Wheelchair Mobility Wheelchair Mobility: No         PT Diagnosis:    PT Problem List:   PT Treatment Interventions:     PT Goals (current goals can now be found in the care plan section) Acute Rehab PT Goals Patient Stated Goal: stop falls PT Goal Formulation: With family Potential to Achieve Goals: Good  Visit Information  Last PT Received On: 09/07/13 Assistance Needed: +2 (for safety ) History of Present Illness: Pt is a 79 y.o. female presenting with frequent falls and acute kidney injury . PMH is significant for chronic renal failure and Parkinson's    Subjective Data  Subjective: pt sitting on [facility] with husband and nurse tech present; CNA reported "pt is having increased difficulty ambulating today; we were too unsafe to make it to the bathroom"; husband and pt agreeable to therapy and had questions regarding discharge disposition  Patient  Stated Goal: stop falls   Cognition  Cognition Arousal/Alertness: Awake/alert Behavior During Therapy: WFL for tasks assessed/performed Overall Cognitive Status: Within  Functional Limits for tasks assessed Area of Impairment: Safety/judgement Safety/Judgement: Decreased awareness of deficits;Decreased awareness of safety General Comments: pt and husband are hopeful for pt to be more independent; seem unaware of deficits and safety concerns regarding D/C planning and DME needs    Balance  Balance Balance Assessed: Yes Static Standing Balance Static Standing - Balance Support: Bilateral upper extremity supported;During functional activity Static Standing - Level of Assistance: 3: Mod assist Static Standing - Comment/# of Minutes: tolerated standing ~3 min for pernieal hygeine   End of Session PT - End of Session Equipment Utilized During Treatment: Gait belt Activity Tolerance: Patient tolerated treatment well Patient left: in chair;with call bell/phone within reach;with family/visitor present Nurse Communication: Mobility status;Other (comment) (CM regarding DME and HHPT )   GP     Donell Sievert, [place] [phone] 09/07/2013, 2:19 PM

## 2013-09-07 NOTE — Progress Notes (Signed)
Family Medicine Teaching Service Daily Progress Note Intern Pager: [phone]  Patient name: [name] Medical record number: [id number] Date of birth: [birth year] Age: 79 y.o. Gender: female  Primary Care Provider: Abbe Amsterdam, MD Consultants: None Code Status: Full  Pt Overview and Major Events to Date:  11/3: Falls; AKI Cr 2.4 (baseline 1.4); Trop neg x 3; Multiple old fractures   Assessment and Plan: [name] is a 79 y.o. female presenting with frequent falls and acute kidney injury. PMH is significant for chronic renal failure and Parkinson's.   # Frequent falls: EKG unremarkable thus far. She has already had a large imaging workup of much of her body, with no significant acute findings. It is likely that the frequency of her falls is increased due to worsening of her chronic Parkinson's. I have reviewed her recent office visit note from her neurologist.  - Multiple old fractures  - previously identified left 7th through 10th rib fractures w/ Further displacement - PT/OT : Home health; Case Management consulted  - Up with assistance only while in the hospital  - telemetry: No events - troponins neg x 3  - Orthostatic: Possitive, 143/45 HR67 Sitting, 122/98 HR94 Standing - Vit D level pending - Decrease Restoril to 7.5mg  qhs - ECHO to assess murmur   # Acute kidney injury: Baseline appears to be around 1.4, currently 2.4. urinalysis unremarkable  - Possibly prerenal from decreased PO intake, so will gently hydrate with 75 cc an hour of normal saline overnight  - Cr: Improved 1.47 < 1.75 < (Admit 2.4) - Holding home Lasix for now while hydrating overnight   # Diabetes:  - moderate SSI QAC & HS   # Hypertension  - Continue home amlodipine   # Parkinson's:  - Continue home Sinemet and amantadine.   # Coronary artery disease:  - Continue home aspirin 81 mg daily   # History of Barrett's esophagus:  - Continue home famotidine and protonix   FEN/GI: Carb  modified diet, KVO Prophylaxis: Subcutaneous heparin   Disposition: telemetry for workup of falls, PT evaluation, monitoring of renal function.   Subjective: Pain in left hip improving. Says she is wants to go home and will consider PT & OT home health.   Objective: Temp:  [97.6 F (36.4 C)-98 F (36.7 C)] 98 F (36.7 C) (11/05 0639) Pulse Rate:  [64-94] 94 (11/05 0639) Resp:  [17-20] 20 (11/05 0639) BP: (122-148)/(45-98) 122/98 mmHg (11/05 0639) SpO2:  [98 %-100 %] 98 % (11/05 1478) Physical Exam: General: NAD, pleasant, cooperative  Cardiovascular: Regular rate and rhythm, 3/6 systolic ejection murmur loudest at right upper sternal border, not radiating into carotids  Respiratory: Clear to auscultation bilaterally, normal respiratory effort  Extremities: Bilateral lower extremities without edema, surgical absence of right second toe, no foot wounds noted. MSK: Tender over left lateral hip.   Laboratory:  Recent Labs Lab 09/05/13 0908 09/06/13 0100 09/07/13 0615  WBC 7.5 6.4 5.3  HGB 10.0* 8.7* 8.4*  HCT 30.8* 26.2* 25.6*  PLT 316 284 277    Recent Labs Lab 09/05/13 0908 09/06/13 0100 09/07/13 0615  NA 143 140 144  K 4.4 3.9 4.2  CL 103 106 113*  CO2 28 25 24   BUN 58* 45* 32*  CREATININE 2.40* 1.75* 1.47*  CALCIUM 9.9 8.4 9.0  PROT 7.0  --   --   BILITOT 0.4  --   --   ALKPHOS 109  --   --   ALT 8  --   --  AST 25  --   --   GLUCOSE 105* 127* 71   Imaging/Diagnostic Tests: CT Abd Pelvis Wo Contrast:  No acute findings in the abdomen/pelvis.  The mild free fluid over the right pelvis extending into the inguinal canal. Subcutaneous edema over the posterior lateral left gluteal region and left hip.  Moderate size hiatal hernia.  Atherosclerotic disease of the aortoiliac system.  Moderate degenerative change of the spine with curvature convex to the right. Old right pelvic fracture.  CT Chest Wo Contrast:  No acute findings in the chest.  Bilateral old rib  fractures.  Mild cardiomegaly with small pericardial effusion.  Stable moderate size hiatal hernia.  Moderate atherosclerotic coronary artery disease.  CXR  1. Stable moderate cardiomegaly. Stable chronic scar/atelectasis at  the right lung base associated with chronic elevation of the right  hemidiaphragm. No acute cardiopulmonary disease.  2. Further displacement of the previously identified left 7th  through 10th rib fractures. This could certainly have happened at  the time of the current fall if there is point tenderness on  physical examination in this region.  L Wrist Xray:  Old distal left radial metaphyseal and ulnar styloid fractures.  No definite acute osseous findings.  CT Cervical Spine w/o Contrast:  Moderate spondylosis with multilevel degenerative disk disease and  degenerative subluxations as described. No acute fracture.  Borderline canal stenosis at the C5-6 level due to posterior  osteophyte formation.  L hand Xray:  Osseous demineralization with old healed distal radial metaphysis seal fracture.  Old ulnar styloid fracture.  Healing fracture at base of 5th metacarpal.  No definite acute bony abnormalities.  CT Head Wo Contrast:  No acute intracranial findings. Small left posterior parietal scalp contusion.  Chronic ischemic microvascular disease.  Wenda Low, MD 09/07/2013, 9:00 AM PGY-1, [facility] Family Medicine FPTS Intern pager: [phone], text pages welcome

## 2013-09-11 LAB — VITAMIN D 1,25 DIHYDROXY
Vitamin D2 1, 25 (OH)2: <8 pg/mL
Vitamin D3 1, 25 (OH)2: 19 pg/mL

## 2013-09-28 ENCOUNTER — Telehealth: Payer: Self-pay | Admitting: Neurology

## 2013-09-28 DIAGNOSIS — G20 Parkinson's disease: Secondary | ICD-10-CM

## 2013-09-28 MED ORDER — PRAMIPEXOLE DIHYDROCHLORIDE 0.125 MG PO TABS: 0.1250 mg | ORAL_TABLET | Freq: Three times a day (TID) | ORAL | Status: DC

## 2013-09-28 NOTE — Telephone Encounter (Signed)
parkinsons disease , followed by dr [name] Furbish?

## 2013-09-28 NOTE — Telephone Encounter (Signed)
I have changed her long-acting Mirapex prescription to immediate release Mirapex, 0.125 mg one pill 3 times a day. Prescription was sent to the pharmacy on file.

## 2013-10-11 ENCOUNTER — Ambulatory Visit (INDEPENDENT_AMBULATORY_CARE_PROVIDER_SITE_OTHER): Payer: Medicare Other | Admitting: Neurology

## 2013-10-11 ENCOUNTER — Encounter: Payer: Self-pay | Admitting: Neurology

## 2013-10-11 ENCOUNTER — Encounter (INDEPENDENT_AMBULATORY_CARE_PROVIDER_SITE_OTHER): Payer: Self-pay

## 2013-10-11 VITALS — BP 127/61 | HR 57 | Temp 97.4°F | Ht <= 58 in

## 2013-10-11 DIAGNOSIS — F329 Major depressive disorder, single episode, unspecified: Secondary | ICD-10-CM

## 2013-10-11 DIAGNOSIS — G4700 Insomnia, unspecified: Secondary | ICD-10-CM

## 2013-10-11 DIAGNOSIS — K5900 Constipation, unspecified: Secondary | ICD-10-CM

## 2013-10-11 DIAGNOSIS — H5316 Psychophysical visual disturbances: Secondary | ICD-10-CM

## 2013-10-11 DIAGNOSIS — F22 Delusional disorders: Secondary | ICD-10-CM

## 2013-10-11 DIAGNOSIS — R441 Visual hallucinations: Secondary | ICD-10-CM

## 2013-10-11 DIAGNOSIS — R609 Edema, unspecified: Secondary | ICD-10-CM

## 2013-10-11 DIAGNOSIS — R296 Repeated falls: Secondary | ICD-10-CM

## 2013-10-11 DIAGNOSIS — R269 Unspecified abnormalities of gait and mobility: Secondary | ICD-10-CM

## 2013-10-11 DIAGNOSIS — Z9181 History of falling: Secondary | ICD-10-CM

## 2013-10-11 DIAGNOSIS — R413 Other amnesia: Secondary | ICD-10-CM

## 2013-10-11 DIAGNOSIS — G20 Parkinson's disease: Secondary | ICD-10-CM

## 2013-10-11 NOTE — Progress Notes (Signed)
Subjective:    Patient ID: [name] is a 79 y.o. female.  HPI    Interim history:   [name] is a very pleasant 79 year old right-handed woman who presents for followup consultation of her Parkinson's disease, complicated by recurrent falls, depression, and memory loss and orthostatic hypotension she is accompanied by her husband today. She presents for a sooner than scheduled appointment due to a recent fall. I last saw her on 08/25/2013, and which time I suggested the same dose of Sinemet. I felt she had a multifactorial gait disorder, which is a function of advancing age, severe kyphoscoliosis, weakness, CHF, and advanced PD with freezing and OH. In the interim, she fell on 09/05/2013 and this resulted in a hip hematoma but no new fractures. She had imaging testing done. She had a hospital stay from 09/05/2013 through 09/07/2013. She was advised to reduce sedating medications. After her last visit I changed her from Mirapex long-acting to immediate release Mirapex for cost. The dose remained the same. I also talked to them about getting additional help for day-to-day chores at the house.   Today, her husband reports, that her PCP changed her to Remeron, which she took one time and had vivid dreams and sleep talking, so he stopped it. She has been on temazepam for years, but the cost will go up. She is still on Mirapex ER, as she had left over pills. He has got her a bed alarm, but she became very upset about it. She resents the restriction it means for her. She has fallen and has even been on the floor until he discovers her. She has started having more hallucinations and delusions, that there are people in the house. She may not use her walker in the kitchen and has tried to prepare food for people in the house, that are not actually there. She has not seen the cardiologist yet. She has had more constipation. She drinks tea and they have increased her fiber intake with Benefiber. She  occasionally takes a laxative.  She has fallen repeatedly. She falls backwards and forward, with and without the walker. She had HH PT/OT/ST for about 6 weeks through [facility] and was told to not transfer or walk without the walker. She sometimes forgets to use the walker. She has become more weak in general. She has had more difficulty feeding herself.  I first met them on 01/20/2013 and she previously followed with Dr. Avie Echevaria for about 12 years. At the time of our first visit I suggested stopping the selegiline as she did not notice much in the way of improvement in her freezing after restarting it. I increased her Sinemet to one whole pill twice daily and continued her pramipexole and amantadine. She has an underlying medical history of spinal stenosis, severe kyphoscoliosis, heart disease with Hx of MI, arthritis, bladder cancer, hypertension, hyperlipidemia, diabetes, peptic ulcer disease, adrenal insufficiency, anxiety, fibromyalgia, recurrent UTIs and recurrent falls. She is status post tonsillectomy, appendectomy and abdominal hysterectomy.  She has had more freezing, some intermittent confusion, more fatigue, more daytime sleepiness, per husband. After taking the selegiline off, she may have had more freezing. She has had recurrent falls. She broke her L wrist and had a cast for 6 weeks. She has been falling repeatedly. She hurt her ribs.  Her Past Medical History Is Significant For: Past Medical History  Diagnosis Date  . MI, old   . Parkinson disease   . Scoliosis   . Arthritis   .  Bladder cancer   . HTN (hypertension) 04/21/2012  . Hyperlipidemia 04/21/2012  . DM (diabetes mellitus) 04/21/2012  . CAD (coronary artery disease) 04/21/2012  . Barrett esophagus 04/21/2012  . PUD (peptic ulcer disease) 04/21/2012  . Adrenal insufficiency 04/21/2012  . Raynaud's disease 04/21/2012  . Anxiety 04/21/2012  . Fibromyalgia 04/21/2012  . Hx of bladder cancer 04/21/2012  . Edema of lower  extremity 04/21/2012    Chronic lower extremity edema  . Falls 04/21/2012  . Recurrent UTI 01/20/2013    Her Past Surgical History Is Significant For: Past Surgical History  Procedure Laterality Date  . Tonsillectomy    . Appendectomy    . Abdominal hysterectomy      Her Family History Is Significant For: Family History  Problem Relation Age of Onset  . Heart disease Mother   . Diabetes Mother   . Bladder Cancer Maternal Aunt     Her Social History Is Significant For: History   Social History  . Marital Status: Married    Spouse Name: N/A    Number of Children: N/A  . Years of Education: N/A   Social History Main Topics  . Smoking status: Former Smoker    Types: Cigarettes  . Smokeless tobacco: Never Used  . Alcohol Use: No  . Drug Use: No  . Sexual Activity: No   Other Topics Concern  . None   Social History Narrative  . None    Her Allergies Are:  Allergies  Allergen Reactions  . Mirtazapine   . Codeine Hives  . Latex   :   Her Current Medications Are:  Outpatient Encounter Prescriptions as of 10/11/2013  Medication Sig  . amantadine (SYMMETREL) 100 MG capsule Take 100 mg by mouth daily.  Marland Kitchen amLODipine (NORVASC) 10 MG tablet Take 10 mg by mouth every evening.  Marland Kitchen aspirin 81 MG tablet Take 81 mg by mouth daily.  . carbidopa-levodopa (SINEMET IR) 25-100 MG per tablet Take 1 tablet by mouth 3 (three) times daily.  . famotidine (PEPCID) 20 MG tablet Take 20 mg by mouth 2 (two) times daily.  . feeding supplement (ENSURE COMPLETE) LIQD Take 237 mLs by mouth daily at 3 pm.  . Multiple Vitamins-Minerals (OCUVITE PO) Take 1 tablet by mouth every evening.  . Omega-3 Fatty Acids (FISH OIL) 1000 MG CAPS Take 1,000 mg by mouth every evening.  Marland Kitchen OVER THE COUNTER MEDICATION Place 2 drops into both eyes daily as needed. Lubricant eye drops for dry eyes  . pantoprazole (PROTONIX) 40 MG tablet Take 40 mg by mouth 2 (two) times daily.  . pramipexole (MIRAPEX) 0.125 MG  tablet Take 1 tablet (0.125 mg total) by mouth 3 (three) times daily.  . temazepam (RESTORIL) 30 MG capsule Take 1 capsule (30 mg total) by mouth at bedtime as needed for sleep.  Marland Kitchen triamcinolone cream (KENALOG) 0.1 % Apply 1 application topically 2 (two) times daily.  :  Review of Systems:  Out of a complete 14 point review of systems, all are reviewed and negative with the exception of these symptoms as listed below:   Review of Systems  Constitutional: Positive for fatigue and unexpected weight change.  HENT: Positive for drooling and hearing loss.   Eyes: Positive for visual disturbance (double vision).  Respiratory: Positive for shortness of breath.   Cardiovascular: Negative.   Gastrointestinal: Positive for constipation.       Incontinence  Endocrine: Negative.   Genitourinary: Positive for enuresis.  Musculoskeletal: Positive for gait problem.  Skin: Negative.   Allergic/Immunologic: Negative.   Neurological: Positive for speech difficulty.       Memory loss  Hematological: Bruises/bleeds easily.       Anemia  Psychiatric/Behavioral: Positive for hallucinations, behavioral problems, confusion, sleep disturbance (frequent waking, daytime sleepiness, sleep talking) and dysphoric mood. The patient is nervous/anxious.     Objective:  Neurologic Exam  Physical Exam Physical Examination:   Filed Vitals:   10/11/13 1222  BP: 127/61  Pulse: 57  Temp: 97.4 F (36.3 C)    General Examination: The patient is a very pleasant 79 y.o. female in no acute distress. She is situated in her WC. She is frail appearing and seems to be sleepy.    HEENT: Normocephalic, atraumatic, pupils are equal, round and reactive to light and accommodation. Extraocular tracking is good without nystagmus noted. She has a bruise around her left wrist. Gaze is conjugate. Normal smooth pursuit is noted. Hearing is grossly intact. Face is symmetric with mild facial masking noted. Speech is clear with no  dysarthria noted. Voice is slightly soft. There is no lip, neck or jaw tremor. Neck is mildly rigid. There are no carotid bruits on auscultation. Oropharynx exam reveals moderate mouth dryness. No significant airway crowding is noted. Mallampati is class II. Tongue protrudes centrally and palate elevates symmetrically.  Chest: is clear to auscultation without wheezing, rhonchi or crackles noted.  Heart: sounds are normal with a 1/6 systolic murmur, no rubs or gallops noted.  Abdomen: is soft, non-tender and non-distended with normal bowel sounds appreciated on auscultation.  Extremities: There is 1+ pitting edema in the distal lower extremities bilaterally.  Skin: is warm and dry with no trophic changes noted.  Musculoskeletal: exam reveals significant kyphoscoliosis, uneven shoulder height. She is leaning to the L.  Neurologically: Mental status: The patient is awake, alert and oriented in all 4 spheres. Her Memory, attention, language and knowledge are impaired. There is no aphasia, agnosia, apraxia or anomia. There is moderate bradyphrenia.  Cranial nerves are as described above under HEENT exam.  Motor exam: Normal bulk, strength and tone is noted. There is no drift, tremor or rebound. Reflexes are 1+ throughout. Fine motor skills are moderately impaired with finger taps and hand movements b/l, L worse than R. She has moderate to severe impairment of foot taps b/l, L worse than R. She has a slight postural and action tremor on the right only. Sensory exam is intact to light touch. I did not ask her to stand or walk for me today.     Assessment and Plan:  In summary, [name] is a very pleasant 79 year old female with a history of advanced Parkinson's disease, left-sided predominant, complicated by severe posture changes, gait d/o, recurrent falls, memory loss, and more lately increase in hallucinations and delusions. She has trouble sleeping. She has been on temazepam for years and he is  wondering if she could be on clonazepam. I've asked him to discuss this with her primary care physician. She tried Remeron for one night but had very vivid dreams and parasomnias.  I again had a long chat with the patient and her husband about my findings and the diagnosis of advanced PD, its prognosis and treatment options. The biggest concern are her recurrent falls. She has injured herself in the past. She is advised to use her walker at all times but at this juncture, she may need to be confined to the wheelchair. She has fallen and not used her alarm  button. I've asked her to start using the bed alarm as well. I have suggested that we have her evaluated for a power wheelchair. I would like to keep the Sinemet the same but would like to taper her off of amantadine because of her hallucinations. She will take half a pill once daily for one week then stop. We will continue with Mirapex ER and when she is finished with it she can switch to Mirapex immediate release 3 times a day. He did not require any prescriptions today. I will see her back in about 3 months, sooner if the need arises. They demonstrated understanding and voiced agreement with the plan.

## 2013-10-11 NOTE — Patient Instructions (Addendum)
Use your walker at all times and your wheel chair.  Please taper off of amantadine: 1/2 pill daily for 1 week, then stop.  For constipation, we can try to increase water intake. Increase fiber intake and we can try a medication called Linzess down the road, add prune juice for now.  Take a laxative as needed.  We will get you evaluated for a motorized wheelchair.  I would like for you to use your bed alarm, so you are at night.

## 2013-10-16 ENCOUNTER — Inpatient Hospital Stay (HOSPITAL_COMMUNITY): Payer: Medicare Other

## 2013-10-16 ENCOUNTER — Emergency Department (HOSPITAL_COMMUNITY): Payer: Medicare Other

## 2013-10-16 ENCOUNTER — Encounter (HOSPITAL_COMMUNITY): Payer: Self-pay | Admitting: Emergency Medicine

## 2013-10-16 ENCOUNTER — Inpatient Hospital Stay (HOSPITAL_COMMUNITY)
Admission: EM | Admit: 2013-10-16 | Discharge: 2013-10-21 | DRG: 091 | Disposition: A | Discharge status: Skilled Nursing Facility | Payer: Medicare Other | Attending: Internal Medicine | Admitting: Internal Medicine

## 2013-10-16 DIAGNOSIS — E869 Volume depletion, unspecified: Secondary | ICD-10-CM | POA: Diagnosis present

## 2013-10-16 DIAGNOSIS — Z8551 Personal history of malignant neoplasm of bladder: Secondary | ICD-10-CM

## 2013-10-16 DIAGNOSIS — F411 Generalized anxiety disorder: Secondary | ICD-10-CM | POA: Diagnosis present

## 2013-10-16 DIAGNOSIS — K5900 Constipation, unspecified: Secondary | ICD-10-CM | POA: Diagnosis present

## 2013-10-16 DIAGNOSIS — R532 Functional quadriplegia: Secondary | ICD-10-CM | POA: Diagnosis present

## 2013-10-16 DIAGNOSIS — E870 Hyperosmolality and hypernatremia: Secondary | ICD-10-CM | POA: Diagnosis not present

## 2013-10-16 DIAGNOSIS — K219 Gastro-esophageal reflux disease without esophagitis: Secondary | ICD-10-CM | POA: Diagnosis present

## 2013-10-16 DIAGNOSIS — N189 Chronic kidney disease, unspecified: Secondary | ICD-10-CM

## 2013-10-16 DIAGNOSIS — I10 Essential (primary) hypertension: Secondary | ICD-10-CM | POA: Diagnosis present

## 2013-10-16 DIAGNOSIS — E119 Type 2 diabetes mellitus without complications: Secondary | ICD-10-CM | POA: Diagnosis present

## 2013-10-16 DIAGNOSIS — N179 Acute kidney failure, unspecified: Secondary | ICD-10-CM | POA: Diagnosis present

## 2013-10-16 DIAGNOSIS — I2510 Atherosclerotic heart disease of native coronary artery without angina pectoris: Secondary | ICD-10-CM

## 2013-10-16 DIAGNOSIS — F0391 Unspecified dementia with behavioral disturbance: Secondary | ICD-10-CM | POA: Diagnosis present

## 2013-10-16 DIAGNOSIS — E43 Unspecified severe protein-calorie malnutrition: Secondary | ICD-10-CM

## 2013-10-16 DIAGNOSIS — K2270 Barrett's esophagus without dysplasia: Secondary | ICD-10-CM | POA: Diagnosis present

## 2013-10-16 DIAGNOSIS — I129 Hypertensive chronic kidney disease with stage 1 through stage 4 chronic kidney disease, or unspecified chronic kidney disease: Secondary | ICD-10-CM | POA: Diagnosis present

## 2013-10-16 DIAGNOSIS — T438X5A Adverse effect of other psychotropic drugs, initial encounter: Secondary | ICD-10-CM | POA: Diagnosis present

## 2013-10-16 DIAGNOSIS — F05 Delirium due to known physiological condition: Secondary | ICD-10-CM

## 2013-10-16 DIAGNOSIS — Z833 Family history of diabetes mellitus: Secondary | ICD-10-CM

## 2013-10-16 DIAGNOSIS — Z87891 Personal history of nicotine dependence: Secondary | ICD-10-CM

## 2013-10-16 DIAGNOSIS — E785 Hyperlipidemia, unspecified: Secondary | ICD-10-CM | POA: Diagnosis present

## 2013-10-16 DIAGNOSIS — G20A1 Parkinson's disease without dyskinesia, without mention of fluctuations: Secondary | ICD-10-CM

## 2013-10-16 DIAGNOSIS — N184 Chronic kidney disease, stage 4 (severe): Secondary | ICD-10-CM | POA: Diagnosis present

## 2013-10-16 DIAGNOSIS — N289 Disorder of kidney and ureter, unspecified: Secondary | ICD-10-CM

## 2013-10-16 DIAGNOSIS — I509 Heart failure, unspecified: Secondary | ICD-10-CM | POA: Diagnosis present

## 2013-10-16 DIAGNOSIS — R269 Unspecified abnormalities of gait and mobility: Secondary | ICD-10-CM

## 2013-10-16 DIAGNOSIS — IMO0001 Reserved for inherently not codable concepts without codable children: Secondary | ICD-10-CM | POA: Diagnosis present

## 2013-10-16 DIAGNOSIS — I252 Old myocardial infarction: Secondary | ICD-10-CM

## 2013-10-16 DIAGNOSIS — Z8249 Family history of ischemic heart disease and other diseases of the circulatory system: Secondary | ICD-10-CM

## 2013-10-16 DIAGNOSIS — E2740 Unspecified adrenocortical insufficiency: Secondary | ICD-10-CM

## 2013-10-16 DIAGNOSIS — Z8052 Family history of malignant neoplasm of bladder: Secondary | ICD-10-CM

## 2013-10-16 DIAGNOSIS — Z79899 Other long term (current) drug therapy: Secondary | ICD-10-CM

## 2013-10-16 DIAGNOSIS — F03918 Unspecified dementia, unspecified severity, with other behavioral disturbance: Secondary | ICD-10-CM | POA: Diagnosis present

## 2013-10-16 DIAGNOSIS — F22 Delusional disorders: Secondary | ICD-10-CM | POA: Diagnosis present

## 2013-10-16 DIAGNOSIS — R5381 Other malaise: Secondary | ICD-10-CM

## 2013-10-16 DIAGNOSIS — Z681 Body mass index (BMI) 19 or less, adult: Secondary | ICD-10-CM

## 2013-10-16 DIAGNOSIS — G92 Toxic encephalopathy: Principal | ICD-10-CM | POA: Diagnosis present

## 2013-10-16 DIAGNOSIS — G929 Unspecified toxic encephalopathy: Principal | ICD-10-CM | POA: Diagnosis present

## 2013-10-16 DIAGNOSIS — T50905A Adverse effect of unspecified drugs, medicaments and biological substances, initial encounter: Secondary | ICD-10-CM

## 2013-10-16 DIAGNOSIS — Z8711 Personal history of peptic ulcer disease: Secondary | ICD-10-CM

## 2013-10-16 DIAGNOSIS — R410 Disorientation, unspecified: Secondary | ICD-10-CM

## 2013-10-16 DIAGNOSIS — E2749 Other adrenocortical insufficiency: Secondary | ICD-10-CM | POA: Diagnosis present

## 2013-10-16 DIAGNOSIS — G9340 Encephalopathy, unspecified: Secondary | ICD-10-CM

## 2013-10-16 DIAGNOSIS — I5032 Chronic diastolic (congestive) heart failure: Secondary | ICD-10-CM | POA: Diagnosis present

## 2013-10-16 DIAGNOSIS — M4120 Other idiopathic scoliosis, site unspecified: Secondary | ICD-10-CM | POA: Diagnosis present

## 2013-10-16 DIAGNOSIS — E860 Dehydration: Secondary | ICD-10-CM

## 2013-10-16 DIAGNOSIS — G20 Parkinson's disease: Secondary | ICD-10-CM

## 2013-10-16 LAB — CBC WITH DIFFERENTIAL/PLATELET
Basophils Absolute: 0.1 10*3/uL (ref 0.0–0.1)
Basophils Relative: 1 % (ref 0–1)
Eosinophils Absolute: 0.2 10*3/uL (ref 0.0–0.7)
Eosinophils Relative: 3 % (ref 0–5)
HCT: 32.5 % — ABNORMAL LOW (ref 36.0–46.0)
Hemoglobin: 11 g/dL — ABNORMAL LOW (ref 12.0–15.0)
Lymphocytes Relative: 21 % (ref 12–46)
Lymphs Abs: 1.3 K/uL (ref 0.7–4.0)
MCH: 28.9 pg (ref 26.0–34.0)
MCHC: 33.8 g/dL (ref 30.0–36.0)
MCV: 85.5 fL (ref 78.0–100.0)
Monocytes Absolute: 0.6 10*3/uL (ref 0.1–1.0)
Monocytes Relative: 9 % (ref 3–12)
Neutro Abs: 4.2 K/uL (ref 1.7–7.7)
Neutrophils Relative %: 67 % (ref 43–77)
Platelets: 325 10*3/uL (ref 150–400)
RBC: 3.8 MIL/uL — ABNORMAL LOW (ref 3.87–5.11)
RDW: 15.3 % (ref 11.5–15.5)
WBC: 6.2 10*3/uL (ref 4.0–10.5)

## 2013-10-16 LAB — BASIC METABOLIC PANEL
CO2: 23 mEq/L (ref 19–32)
Calcium: 9.8 mg/dL (ref 8.4–10.5)
Creatinine, Ser: 2.7 mg/dL — ABNORMAL HIGH (ref 0.50–1.10)
GFR calc non Af Amer: 15 mL/min — ABNORMAL LOW (ref >90)
Potassium: 4.3 mEq/L (ref 3.5–5.1)
Sodium: 140 mEq/L (ref 135–145)

## 2013-10-16 LAB — BASIC METABOLIC PANEL WITH GFR
BUN: 60 mg/dL — ABNORMAL HIGH (ref 6–23)
Chloride: 102 meq/L (ref 96–112)
GFR calc Af Amer: 18 mL/min — ABNORMAL LOW (ref >90)
Glucose, Bld: 88 mg/dL (ref 70–99)

## 2013-10-16 LAB — URINALYSIS, ROUTINE W REFLEX MICROSCOPIC
Bilirubin Urine: NEGATIVE
Glucose, UA: NEGATIVE mg/dL
Hgb urine dipstick: NEGATIVE
Ketones, ur: NEGATIVE mg/dL
Leukocytes, UA: NEGATIVE
Nitrite: NEGATIVE
Protein, ur: NEGATIVE mg/dL
Specific Gravity, Urine: 1.011 (ref 1.005–1.030)
Urobilinogen, UA: 0.2 mg/dL (ref 0.0–1.0)
pH: 7 (ref 5.0–8.0)

## 2013-10-16 LAB — TSH: TSH: 4.394 u[IU]/mL (ref 0.350–4.500)

## 2013-10-16 MED ORDER — PANTOPRAZOLE SODIUM 40 MG PO TBEC
40.0000 mg | DELAYED_RELEASE_TABLET | Freq: Two times a day (BID) | ORAL | Status: DC
  Administered 2013-10-16 – 2013-10-21 (×10): 40 mg via ORAL
  Filled 2013-10-16 (×12): qty 1

## 2013-10-16 MED ORDER — BIOTIN 5000 MCG PO CAPS: 1.0000 | ORAL_CAPSULE | Freq: Two times a day (BID) | ORAL | Status: DC

## 2013-10-16 MED ORDER — ONDANSETRON HCL 4 MG/2ML IJ SOLN: 4.0000 mg | Freq: Four times a day (QID) | INTRAMUSCULAR | Status: DC | PRN

## 2013-10-16 MED ORDER — ONDANSETRON HCL 4 MG PO TABS: 4.0000 mg | ORAL_TABLET | Freq: Four times a day (QID) | ORAL | Status: DC | PRN

## 2013-10-16 MED ORDER — SODIUM CHLORIDE 0.9 % IV BOLUS (SEPSIS)
500.0000 mL | Freq: Once | INTRAVENOUS | Status: AC
  Administered 2013-10-16: 500 mL via INTRAVENOUS

## 2013-10-16 MED ORDER — OMEGA-3-ACID ETHYL ESTERS 1 G PO CAPS
1.0000 g | ORAL_CAPSULE | Freq: Every day | ORAL | Status: DC
  Administered 2013-10-16 – 2013-10-21 (×6): 1 g via ORAL
  Filled 2013-10-16 (×6): qty 1

## 2013-10-16 MED ORDER — CARBIDOPA-LEVODOPA 25-100 MG PO TABS
1.0000 | ORAL_TABLET | Freq: Three times a day (TID) | ORAL | Status: DC
  Administered 2013-10-16 – 2013-10-21 (×14): 1 via ORAL
  Filled 2013-10-16 (×17): qty 1

## 2013-10-16 MED ORDER — ACETAMINOPHEN 650 MG RE SUPP: 650.0000 mg | Freq: Four times a day (QID) | RECTAL | Status: DC | PRN

## 2013-10-16 MED ORDER — LORAZEPAM 2 MG/ML IJ SOLN
0.5000 mg | Freq: Four times a day (QID) | INTRAMUSCULAR | Status: DC | PRN
  Administered 2013-10-16 – 2013-10-17 (×2): 0.5 mg via INTRAVENOUS
  Filled 2013-10-16 (×3): qty 1

## 2013-10-16 MED ORDER — ACETAMINOPHEN 325 MG PO TABS
650.0000 mg | ORAL_TABLET | Freq: Four times a day (QID) | ORAL | Status: DC | PRN
  Administered 2013-10-16 – 2013-10-19 (×2): 650 mg via ORAL
  Filled 2013-10-16 (×2): qty 2

## 2013-10-16 MED ORDER — SODIUM CHLORIDE 0.9 % IJ SOLN
3.0000 mL | Freq: Two times a day (BID) | INTRAMUSCULAR | Status: DC
  Administered 2013-10-16 – 2013-10-20 (×2): 3 mL via INTRAVENOUS

## 2013-10-16 MED ORDER — ENSURE COMPLETE PO LIQD: 237.0000 mL | ORAL | Status: DC | PRN

## 2013-10-16 MED ORDER — SODIUM CHLORIDE 0.9 % IV SOLN
INTRAVENOUS | Status: DC
  Administered 2013-10-16: 16:00:00 via INTRAVENOUS
  Administered 2013-10-17: 75 mL via INTRAVENOUS

## 2013-10-16 MED ORDER — ASPIRIN 81 MG PO TABS
81.0000 mg | ORAL_TABLET | Freq: Every morning | ORAL | Status: DC
  Administered 2013-10-17: 81 mg via ORAL
  Filled 2013-10-16 (×2): qty 1

## 2013-10-16 MED ORDER — ENOXAPARIN SODIUM 30 MG/0.3ML ~~LOC~~ SOLN
30.0000 mg | SUBCUTANEOUS | Status: DC
  Administered 2013-10-16 – 2013-10-20 (×5): 30 mg via SUBCUTANEOUS
  Filled 2013-10-16 (×6): qty 0.3

## 2013-10-16 MED ORDER — VITAMIN D3 25 MCG (1000 UNIT) PO TABS
2000.0000 [IU] | ORAL_TABLET | Freq: Every day | ORAL | Status: DC
  Administered 2013-10-16 – 2013-10-21 (×6): 2000 [IU] via ORAL
  Filled 2013-10-16 (×6): qty 2

## 2013-10-16 MED ORDER — CHOLECALCIFEROL 50 MCG (2000 UT) PO TABS: 1.0000 | ORAL_TABLET | Freq: Every morning | ORAL | Status: DC

## 2013-10-16 NOTE — ED Notes (Signed)
Bed: GN56 Expected date: 10/16/13 Expected time: 8:57 AM Means of arrival: Ambulance Comments: Meds evauation

## 2013-10-16 NOTE — ED Notes (Signed)
EMS were called by pt's. Husband, who reports pt. To be confused.  They have recently begun to taper from Amantadine; also, Mirtazepine has been added to her regimin.  She is in no distress.

## 2013-10-16 NOTE — H&P (Signed)
[facility] History and Physical  [name] FAO:[id number] DOB: Dec 19, 1927 DOA: 10/16/2013   PCP: Allean Found, MD   Chief Complaint: Confusion, agitation, hallucinations  HPI:  79 year old female with a history of Parkinson's disease, hypertension, hyperlipidemia, and CAD presents with one-week history of worsening agitation, confusion, and hallucinations. A little over one week prior to admission, the patient's sleeping medicine was changed to Remeron. She was previously taking Restoril. After one dose, she had worsening confusion following day. Her primary care physician was contacted. The patient was instructed to continue her Remeron as an adequate trial to see if it was indeed causing the confusion and agitation. In addition, the patient's neurologist has also been trying to wean the patient's amantadine. She has been taking it every 48 hours rather than daily. For the past 3 days, there has been increasing hallucinations and paranoia. The patient is a poor historian. All of this history is obtained from speaking with the patient's husband at the bedside. There've been no reports of fevers, chills, chest discomfort, nausea, vomiting, diarrhea, abdominal pain. He reports that there has been some intermittent shortness of breath. At baseline, the patient has intermittent hallucinations and confusion on a "good day". The only other new medication was some antibiotic in the past week used as prophylaxis for the patient's bladder botulinum toxin procedure.  In the ED, the patient received 500 cc normal saline. She was hemodynamically stable. Serum creatinine was noted to be 2.70. CBC was unremarkable. Urinalysis was negative for any pyuria. EKG shows sinus rhythm with left anterior fascicular block which has been unchanged. CT of brain was negative. Assessment/Plan: Acute encephalopathy -Multifactorial including the patient's AKI and medications -Discontinue mirapex and amantidine  as these can cause increase hallucination and aggitation in PD patient whom already had hallucinations -check TSH, B12, ammonia -Discontinue Pepcid -Certainly, the patient's Remeron may be contributing to her confusion Acute on Chronic Renal Failure (CKD IV) -Baseline creatinine 1.3-1.4 -Continue IV fluids -The patient looks volume depleted -Discontinue furosemide Parkinson's disease -Continue Sinemet Hypertension -Hold amlodipine for now and monitor blood pressure Coronary artery disease -Stable -EKG negative for ST-T wave changes -Continue aspirin 81 mg daily      Past Medical History  Diagnosis Date  . MI, old   . Parkinson disease   . Scoliosis   . Arthritis   . Bladder cancer   . HTN (hypertension) 04/21/2012  . Hyperlipidemia 04/21/2012  . DM (diabetes mellitus) 04/21/2012  . CAD (coronary artery disease) 04/21/2012  . Barrett esophagus 04/21/2012  . PUD (peptic ulcer disease) 04/21/2012  . Adrenal insufficiency 04/21/2012  . Raynaud's disease 04/21/2012  . Anxiety 04/21/2012  . Fibromyalgia 04/21/2012  . Hx of bladder cancer 04/21/2012  . Edema of lower extremity 04/21/2012    Chronic lower extremity edema  . Falls 04/21/2012  . Recurrent UTI 01/20/2013   Past Surgical History  Procedure Laterality Date  . Tonsillectomy    . Appendectomy    . Abdominal hysterectomy     Social History:  reports that she has quit smoking. Her smoking use included Cigarettes. She smoked 0.00 packs per day. She has never used smokeless tobacco. She reports that she does not drink alcohol or use illicit drugs.   Family History  Problem Relation Age of Onset  . Heart disease Mother   . Diabetes Mother   . Bladder Cancer Maternal Aunt      Allergies  Allergen Reactions  . Mirtazapine Other (See Comments)    confusion  .  Codeine Hives  . Latex Rash    Use cotton dressings      Prior to Admission medications   Medication Sig Start Date End Date Taking? Authorizing Provider   amantadine (SYMMETREL) 100 MG capsule Take 100 mg by mouth every morning.    Yes Historical Provider, MD  amLODipine (NORVASC) 10 MG tablet Take 10 mg by mouth every evening.   Yes Historical Provider, MD  aspirin 81 MG tablet Take 81 mg by mouth every morning.    Yes Historical Provider, MD  Biotin 5000 MCG CAPS Take 1 capsule by mouth 2 (two) times daily.   Yes Historical Provider, MD  carbidopa-levodopa (SINEMET IR) 25-100 MG per tablet Take 1 tablet by mouth 3 (three) times daily.   Yes Historical Provider, MD  Cholecalciferol 2000 UNITS TABS Take 1 tablet by mouth every morning.   Yes Historical Provider, MD  famotidine (PEPCID) 20 MG tablet Take 20 mg by mouth 2 (two) times daily.   Yes Historical Provider, MD  feeding supplement (ENSURE COMPLETE) LIQD Take 237 mLs by mouth as needed (patient preference).    Yes Historical Provider, MD  furosemide (LASIX) 40 MG tablet Take 1 tablet by mouth every morning.  10/01/13  Yes Historical Provider, MD  Multiple Vitamins-Minerals (OCUVITE PO) Take 1 tablet by mouth every evening.   Yes Historical Provider, MD  Omega-3 Fatty Acids (FISH OIL) 1000 MG CAPS Take 1,000 mg by mouth every evening.   Yes Historical Provider, MD  pantoprazole (PROTONIX) 40 MG tablet Take 40 mg by mouth 2 (two) times daily.   Yes Historical Provider, MD  Pramipexole Dihydrochloride 0.375 MG TB24 Take 1 tablet by mouth every evening.   Yes Historical Provider, MD  temazepam (RESTORIL) 30 MG capsule Take 1 capsule (30 mg total) by mouth at bedtime as needed for sleep. 09/07/13  Yes Lonia Skinner, MD  triamcinolone cream (KENALOG) 0.1 % Apply 1 application topically 2 (two) times daily as needed (rash).    Yes Historical Provider, MD    Review of Systems:  Unobtainable secondary to patient's mental status  Physical Exam: Filed Vitals:   10/16/13 0914 10/16/13 1201  BP: 141/67 143/57  Pulse:  78  Temp: 97.6 F (36.4 C)   TempSrc: Oral   Resp: 20 18  SpO2: 99% 96%    General:  Alert, awake, pleasantly confused, NAD, nontoxic, pleasant/cooperative Head/Eye: No conjunctival hemorrhage, no icterus, [place]/AT, No nystagmus ENT:  No icterus,  No thrush, no pharyngeal exudate Neck:  No masses, no lymphadenpathy, no bruits CV:  RRR, no rub, no gallop, no S3 Lung:  Bibasilar rales. No wheezing. Good air movement. Abdomen: soft/NT, +BS, nondistended, no peritoneal signs Ext: No cyanosis, No rashes, No petechiae, No lymphangitis, trace LE edema   Labs on Admission:  Basic Metabolic Panel:  Recent Labs Lab 10/16/13 1008  NA 140  K 4.3  CL 102  CO2 23  GLUCOSE 88  BUN 60*  CREATININE 2.70*  CALCIUM 9.8   Liver Function Tests: No results found for this basename: AST, ALT, ALKPHOS, BILITOT, PROT, ALBUMIN,  in the last 168 hours No results found for this basename: LIPASE, AMYLASE,  in the last 168 hours No results found for this basename: AMMONIA,  in the last 168 hours CBC:  Recent Labs Lab 10/16/13 1008  WBC 6.2  NEUTROABS 4.2  HGB 11.0*  HCT 32.5*  MCV 85.5  PLT 325   Cardiac Enzymes: No results found for this basename: CKTOTAL, CKMB, CKMBINDEX,  TROPONINI,  in the last 168 hours BNP: No components found with this basename: POCBNP,  CBG: No results found for this basename: GLUCAP,  in the last 168 hours  Radiological Exams on Admission: Ct Head Wo Contrast  10/16/2013   CLINICAL DATA:  Altered mental status  EXAM: CT HEAD WITHOUT CONTRAST  TECHNIQUE: Contiguous axial images were obtained from the base of the skull through the vertex without intravenous contrast.  COMPARISON:  09/05/2013  FINDINGS: There is diffuse cortical atrophy. Cerebellar atrophy is also appreciated. Diffuse areas of low attenuation project within the subcortical, deep, and periventricular white matter regions. There is no evidence of mass effect, intra-axial or extra-axial fluid collections, nor acute hemorrhage. The ventricles and cisterns are patent. There is no  evidence of a depressed skull fracture. Very mild air-fluid level projects in the base of the left maxillary sinus. The ethmoid air cells and remaining visualized paranasal sinuses are patent.  IMPRESSION: Chronic and involutional changes without evidence of acute abnormalities.   Electronically Signed   By: Salome Holmes M.D.   On: 10/16/2013 10:36    EKG: Independently reviewed. Sinus rhythm, no ST-T wave changes, LAFB    Time spent:70 minutes Code Status:   FULL Family Communication:   Husband at bedside   Hind Chesler, DO  [facility] Pager [phone]  If 7PM-7AM, please contact night-coverage [email or web address] Password [email or web address] 10/16/2013, 1:28 PM

## 2013-10-16 NOTE — ED Notes (Signed)
Attempted to call report, RN to call me back

## 2013-10-16 NOTE — ED Notes (Signed)
Patient back from CT.

## 2013-10-16 NOTE — ED Provider Notes (Addendum)
CSN: [id number]     Arrival date & time 10/16/13  0910 History   First MD Initiated Contact with Patient 10/16/13 [phone]     Chief Complaint  Patient presents with  . Altered Mental Status   (Consider location/radiation/quality/duration/timing/severity/associated sxs/prior Treatment) Patient is a 79 y.o. female presenting with altered mental status. The history is provided by the patient and the EMS personnel. The history is limited by the condition of the patient.  Altered Mental Status Presenting symptoms: behavior changes, confusion and disorientation   Severity:  Moderate Most recent episode:  2 days ago Episode history:  Multiple Timing:  Intermittent Progression:  Unchanged Chronicity:  New Context: alcohol use and recent change in medication   Context: not head injury, not a recent illness and not a recent infection   Associated symptoms: no abdominal pain, no difficulty breathing, no fever, no nausea and no vomiting     Past Medical History  Diagnosis Date  . MI, old   . Parkinson disease   . Scoliosis   . Arthritis   . Bladder cancer   . HTN (hypertension) 04/21/2012  . Hyperlipidemia 04/21/2012  . DM (diabetes mellitus) 04/21/2012  . CAD (coronary artery disease) 04/21/2012  . Barrett esophagus 04/21/2012  . PUD (peptic ulcer disease) 04/21/2012  . Adrenal insufficiency 04/21/2012  . Raynaud's disease 04/21/2012  . Anxiety 04/21/2012  . Fibromyalgia 04/21/2012  . Hx of bladder cancer 04/21/2012  . Edema of lower extremity 04/21/2012    Chronic lower extremity edema  . Falls 04/21/2012  . Recurrent UTI 01/20/2013   Past Surgical History  Procedure Laterality Date  . Tonsillectomy    . Appendectomy    . Abdominal hysterectomy     Family History  Problem Relation Age of Onset  . Heart disease Mother   . Diabetes Mother   . Bladder Cancer Maternal Aunt    History  Substance Use Topics  . Smoking status: Former Smoker    Types: Cigarettes  . Smokeless tobacco: Never  Used  . Alcohol Use: No   OB History   Grav Para Term Preterm Abortions TAB SAB Ect Mult Living                 Review of Systems  Unable to perform ROS: Mental status change  Constitutional: Negative for fever.  Gastrointestinal: Negative for nausea, vomiting and abdominal pain.  Psychiatric/Behavioral: Positive for confusion.    Allergies  Mirtazapine; Codeine; and Latex  Home Medications   Current Outpatient Rx  Name  Route  Sig  Dispense  Refill  . amantadine (SYMMETREL) 100 MG capsule   Oral   Take 100 mg by mouth daily.         Marland Kitchen amLODipine (NORVASC) 10 MG tablet   Oral   Take 10 mg by mouth every evening.         Marland Kitchen aspirin 81 MG tablet   Oral   Take 81 mg by mouth daily.         . carbidopa-levodopa (SINEMET IR) 25-100 MG per tablet   Oral   Take 1 tablet by mouth 3 (three) times daily.         . famotidine (PEPCID) 20 MG tablet   Oral   Take 20 mg by mouth 2 (two) times daily.         . feeding supplement (ENSURE COMPLETE) LIQD   Oral   Take 237 mLs by mouth daily at 3 pm.         .  furosemide (LASIX) 40 MG tablet   Oral   Take 1 tablet by mouth daily.         . mirtazapine (REMERON) 15 MG tablet   Oral   Take 1 tablet by mouth daily.         . Multiple Vitamins-Minerals (OCUVITE PO)   Oral   Take 1 tablet by mouth every evening.         . Omega-3 Fatty Acids (FISH OIL) 1000 MG CAPS   Oral   Take 1,000 mg by mouth every evening.         Marland Kitchen OVER THE COUNTER MEDICATION   Both Eyes   Place 2 drops into both eyes daily as needed. Lubricant eye drops for dry eyes         . pantoprazole (PROTONIX) 40 MG tablet   Oral   Take 40 mg by mouth 2 (two) times daily.         . pramipexole (MIRAPEX) 0.125 MG tablet   Oral   Take 1 tablet (0.125 mg total) by mouth 3 (three) times daily.   270 tablet   3   . temazepam (RESTORIL) 30 MG capsule   Oral   Take 1 capsule (30 mg total) by mouth at bedtime as needed for sleep.   30  capsule   0   . triamcinolone cream (KENALOG) 0.1 %   Topical   Apply 1 application topically 2 (two) times daily.          BP 143/57  Pulse 78  Temp(Src) 97.6 F (36.4 C) (Oral)  Resp 18  SpO2 96% Physical Exam  Nursing note and vitals reviewed. Constitutional: She appears well-developed and well-nourished. She is cooperative.  Non-toxic appearance. She does not have a sickly appearance. She does not appear ill. No distress.  HENT:  Head: Normocephalic and atraumatic.  Mouth/Throat: Mucous membranes are not dry.  Eyes: Conjunctivae and EOM are normal. No scleral icterus.  Neck: Neck supple.  Cardiovascular: Normal rate and regular rhythm.   No murmur heard. Pulmonary/Chest: Effort normal. No respiratory distress. She has no wheezes.  Abdominal: Soft. There is no tenderness. There is no rebound and no guarding.  Musculoskeletal: She exhibits no edema.  Neurological: She is alert. She is not disoriented. She displays tremor. She exhibits abnormal muscle tone. Coordination abnormal.  Mild dystonia of neck and face  Skin: Skin is warm and dry. No rash noted. She is not diaphoretic. No pallor.    ED Course  Procedures (including critical care time) Labs Review Labs Reviewed  CBC WITH DIFFERENTIAL - Abnormal; Notable for the following:    RBC 3.80 (*)    Hemoglobin 11.0 (*)    HCT 32.5 (*)    All other components within normal limits  BASIC METABOLIC PANEL - Abnormal; Notable for the following:    BUN 60 (*)    Creatinine, Ser 2.70 (*)    GFR calc non Af Amer 15 (*)    GFR calc Af Amer 18 (*)    All other components within normal limits  URINALYSIS, ROUTINE W REFLEX MICROSCOPIC   Imaging Review Ct Head Wo Contrast  10/16/2013   CLINICAL DATA:  Altered mental status  EXAM: CT HEAD WITHOUT CONTRAST  TECHNIQUE: Contiguous axial images were obtained from the base of the skull through the vertex without intravenous contrast.  COMPARISON:  09/05/2013  FINDINGS: There is  diffuse cortical atrophy. Cerebellar atrophy is also appreciated. Diffuse areas of low attenuation project within  the subcortical, deep, and periventricular white matter regions. There is no evidence of mass effect, intra-axial or extra-axial fluid collections, nor acute hemorrhage. The ventricles and cisterns are patent. There is no evidence of a depressed skull fracture. Very mild air-fluid level projects in the base of the left maxillary sinus. The ethmoid air cells and remaining visualized paranasal sinuses are patent.  IMPRESSION: Chronic and involutional changes without evidence of acute abnormalities.   Electronically Signed   By: Salome Holmes M.D.   On: 10/16/2013 10:36    EKG Interpretation    Date/Time:  "Sunday October 16 2013 10:15:35 EST Ventricular Rate:  67 PR Interval:  155 QRS Duration: 105 QT Interval:  423 QTC Calculation: 446 R Axis:   -50 Text Interpretation:  Sinus rhythm Probable left atrial enlargement Left anterior fascicular block Low voltage, precordial leads Abnormal R-wave progression, late transition No significant change since last tracing Abnormal ECG Confirmed by Deriyah Kunath  MD, MICHEAL ([id number]) on 10/16/2013 10:30:47 AM           RA sat is 99% and I interpret to be normal   11:26 AM More history obtained from pt.  Pt's PCP was changing meds for sleep aid due to insurance.  However she had more severe hallucinations, couldn't sleep with 1 dose trial 1 week ago.  However, after spouse called and spoke about it, Dr. Smith felt needed to try longer than just 1 dose.  In addition, for past 4 days, neurologist also has wanted to decrease and stop amantadine due to the pt's worse falls.  Unsure which if both is causing pt to have more severe and frequent hallucinations, confusion, agitation, paranoid, not letting spouse help her and he is not able to manage her in her current state.     12" :24 PM UA and head CT are neg for acute.  I suspect delirium related to changes  in medication.  Pt cannot be handled by spouse who is caregiver.  Will consult for admission for medication adjustment and monitoring for improvement.  Some renal insuff noted worse than baseline, could be due to worse PO intake.  IVF bolus given.   MDM   1. Delirium superimposed on dementia   2. Delirium, induced by drug   3. Renal insufficiency      Pt is oriented here, has had several medications changed recently.  Mirapex was supposed to be cut in half based on notes from Neurology visit on 12/9, however, family unable to do so and thus has been giving a full tablet every other day.  remeron has been added and that is also listed as an allergy due to vivid dreams and hallucinations.  Pt is afebrile, conversant, cooperative, has memory of being argumentative yesterday and that her behavior and thought process seem different.  Due to fall risk, although no signs of head injury present, will obtain head CT.  If all of this is normal, pt's symptoms are likely due to advanced parkinson's and drug interactions.  Pt has aids at home 3 days out of the week.      Gavin Pound. Oletta Lamas, MD 10/16/13 1226  Gavin Pound. Dusty Wagoner, MD 10/16/13 1226

## 2013-10-16 NOTE — ED Notes (Signed)
Patient transported to CT 

## 2013-10-16 NOTE — ED Notes (Signed)
Familt stepped off floor to get lunch. Britta Mccreedy (daughter) number is [phone] and Annette Stable (husband) [phone]

## 2013-10-16 NOTE — Progress Notes (Signed)

## 2013-10-17 ENCOUNTER — Ambulatory Visit: Payer: Medicare Other | Admitting: Neurology

## 2013-10-17 DIAGNOSIS — F05 Delirium due to known physiological condition: Secondary | ICD-10-CM

## 2013-10-17 LAB — BASIC METABOLIC PANEL
BUN: 44 mg/dL — ABNORMAL HIGH (ref 6–23)
CO2: 20 mEq/L (ref 19–32)
Chloride: 109 mEq/L (ref 96–112)
GFR calc non Af Amer: 19 mL/min — ABNORMAL LOW (ref >90)
Glucose, Bld: 74 mg/dL (ref 70–99)
Potassium: 3.8 mEq/L (ref 3.5–5.1)

## 2013-10-17 MED ORDER — CHLORHEXIDINE GLUCONATE 0.12 % MT SOLN
15.0000 mL | Freq: Two times a day (BID) | OROMUCOSAL | Status: DC
  Administered 2013-10-18 – 2013-10-19 (×4): 15 mL via OROMUCOSAL
  Filled 2013-10-17 (×8): qty 15

## 2013-10-17 MED ORDER — LORAZEPAM 2 MG/ML IJ SOLN
1.0000 mg | Freq: Four times a day (QID) | INTRAMUSCULAR | Status: DC | PRN
  Administered 2013-10-17 – 2013-10-20 (×2): 1 mg via INTRAVENOUS
  Filled 2013-10-17 (×2): qty 1

## 2013-10-17 MED ORDER — MUPIROCIN 2 % EX OINT
1.0000 "application " | TOPICAL_OINTMENT | Freq: Two times a day (BID) | CUTANEOUS | Status: DC
  Administered 2013-10-18 – 2013-10-21 (×8): 1 via NASAL
  Filled 2013-10-17: qty 22

## 2013-10-17 MED ORDER — BIOTENE DRY MOUTH MT LIQD
15.0000 mL | Freq: Two times a day (BID) | OROMUCOSAL | Status: DC
  Administered 2013-10-18 – 2013-10-19 (×2): 15 mL via OROMUCOSAL

## 2013-10-17 MED ORDER — CHLORHEXIDINE GLUCONATE CLOTH 2 % EX PADS
6.0000 | MEDICATED_PAD | Freq: Every day | CUTANEOUS | Status: DC
  Administered 2013-10-18: 6 via TOPICAL

## 2013-10-17 NOTE — Progress Notes (Signed)
MD via phone given update Pt lying in bed noted to have severe violent jerking episodes noted to be different than Pt's parkinson's shaking Pt demonstrated earlier. VSS and new orders given.and order to transfer to stepdown.

## 2013-10-17 NOTE — Care Management Note (Addendum)
    Page 1 of 2   10/21/2013     1:00:03 PM   CARE MANAGEMENT NOTE 10/21/2013  Patient:  [name], [name]   Account Number:  [id number]  Date Initiated:  10/17/2013  Documentation initiated by:  Lanier Clam  Subjective/Objective Assessment:   79 Y/O F ADMITTED W/AMS.?PARKINSON MEDICATION INDUCED.ACUTE ENCEPHALOPATHY.     Action/Plan:   FROM HOME W/SPOUSE.   Anticipated DC Date:  10/21/2013   Anticipated DC Plan:  SKILLED NURSING FACILITY  In-house referral  Clinical Social Worker      DC Planning Services  CM consult      [facility] Choice  Resumption Of Svcs/PTA Provider   Choice offered to / List presented to:  NA        HH arranged  HH-1 RN  HH-2 PT  HH-4 NURSE'S AIDE      HH agency  [facility]   Status of service:  Completed, signed off Medicare Important Message given?   (If response is "NO", the following Medicare IM given date fields will be blank) Date Medicare IM given:   Date Additional Medicare IM given:    Discharge Disposition:  SKILLED NURSING FACILITY  Per UR Regulation:  Reviewed for med. necessity/level of care/duration of stay  If discussed at Long Length of Stay Meetings, dates discussed:    Comments:  10-20-13 Lorenda Ishihara RN CM 1100 Husband agrees with PT recommendations for SNF, CSW consulted.  10/17/13 Raniya Golembeski RN,BSN NCM [phone] AWAIT PT RECOMMENDATIONS.

## 2013-10-17 NOTE — Progress Notes (Signed)
Pt now calmer after 1mg  IV Ativan per new orders. Report called to ICU and will transport PT to stepdown

## 2013-10-17 NOTE — Progress Notes (Signed)
Patient ID: [name], female   DOB: 1928/05/07, 79 y.o.   MRN: [id number]  [facility] PROGRESS NOTE  [name]:[id number] DOB: 1928-10-11 DOA: 10/16/2013 PCP: Allean Found, [name]  Brief narrative: 79 year old female with a history of Parkinson's disease, hypertension, hyperlipidemia, and CAD presented with one-week history of worsening agitation, confusion, and hallucinations. A little over one week prior to admission, the patient's sleeping medicine was changed to Remeron. She was previously taking Restoril. After one dose, she had worsening confusion following day. Her primary care physician was contacted. The patient was instructed to continue her Remeron as an adequate trial to see if it was indeed causing the confusion and agitation. In addition, the patient's neurologist has also been trying to wean the patient's amantadine. She has been taking it every 48 hours rather than daily. For the past 3 days, there has been increasing hallucinations and paranoia. The patient is a poor historian. All of this history was obtained from speaking with the patient's husband. There've been no reports of fevers, chills, chest discomfort, nausea, vomiting, diarrhea, abdominal pain. At baseline, the patient has intermittent hallucinations and confusion on a "good day".  In the ED, the patient received 500 cc normal saline. She was hemodynamically stable. Serum creatinine was noted to be 2.70. CBC was unremarkable. Urinalysis was negative for any pyuria. EKG showed sinus rhythm with left anterior fascicular block which has been unchanged. CT of brain was negative.   Assessment/Plan:  Acute encephalopathy  - Multifactorial including the patient's AKI and medications  - Discontinued mirapex and amantidine as these can cause increase hallucination and aggitation in PD patient whom already had hallucinations  - pending TSH, B12 Acute on Chronic Renal Failure (CKD IV)  - Baseline creatinine  1.3-1.4  - Continue IV fluids as pt is responding well and Cr trending down Parkinson's disease  - Continue Sinemet  Hypertension  - Hold amlodipine for now and monitor blood pressure  Coronary artery disease  - Stable  - EKG negative for ST-T wave changes  - Continue aspirin 81 mg daily  Functional quadriplegia - secondary to progressive dementia and PD - PT evaluation  Severe malnutrition - secondary to the above FFT, PD, dementia - nutrition consultation   Consultants:  None  Procedures/Studies: Ct Head Wo Contrast   10/16/2013   Chronic and involutional changes without evidence of acute abnormalities.  Dg Chest Port 1 View   10/16/2013   Poor inspiratory effort.  Likely mild vascular congestion.    Antibiotics:  None  Code Status: Full Family Communication: Pt at bedside Disposition Plan: Home when medically stable  HPI/Subjective: No events overnight.   Objective: Filed Vitals:   10/16/13 1553 10/16/13 2108 10/17/13 0534 10/17/13 1450  BP: 130/88 114/59 131/48 126/82  Pulse: 77 77 71 77  Temp: 98 F (36.7 C)  98.1 F (36.7 C) 97.5 F (36.4 C)  TempSrc: Oral  Oral Oral  Resp: 20 30 24 22   Height: 4\' 8"  (1.422 m)     Weight: 45.36 kg (100 lb)     SpO2: 95%  99% 99%    Intake/Output Summary (Last 24 hours) at 10/17/13 1600 Last data filed at 10/17/13 1453  Gross per 24 hour  Intake 866.25 ml  Output    400 ml  Net 466.25 ml    Exam:   General:  Pt is alert, follows commands appropriately, not in acute distress  Cardiovascular: Regular rate and rhythm, S1/S2, no murmurs, no rubs, no gallops  Respiratory: Clear to auscultation bilaterally, no wheezing, no crackles, no rhonchi  Abdomen: Soft, non tender, non distended, bowel sounds present, no guarding  Extremities: No edema, pulses DP and PT palpable bilaterally  Neuro: Grossly nonfocal  Data Reviewed: Basic Metabolic Panel:  Recent Labs Lab 10/16/13 1008 10/17/13 0534  NA 140 144  K  4.3 3.8  CL 102 109  CO2 23 20  GLUCOSE 88 74  BUN 60* 44*  CREATININE 2.70* 2.26*  CALCIUM 9.8 9.0    Recent Labs Lab 10/16/13 1627  AMMONIA 28   CBC:  Recent Labs Lab 10/16/13 1008  WBC 6.2  NEUTROABS 4.2  HGB 11.0*  HCT 32.5*  MCV 85.5  PLT 325   Scheduled Meds: . aspirin  81 mg Oral q morning - 10a  . carbidopa-levodopa  1 tablet Oral TID  . enoxaparin injection  30 mg Subcutaneous Q24H  . pantoprazole  40 mg Oral BID   Continuous Infusions: . sodium chloride 75 mL/hr at 10/16/13 1548   [name] Presto, [name]  [facility] Pager [phone]  If 7PM-7AM, please contact night-coverage [email or web address] Password [email or web address] 10/17/2013, 4:00 PM   LOS: 1 day

## 2013-10-18 ENCOUNTER — Ambulatory Visit: Payer: Medicare Other | Admitting: Podiatry

## 2013-10-18 DIAGNOSIS — E43 Unspecified severe protein-calorie malnutrition: Secondary | ICD-10-CM | POA: Diagnosis present

## 2013-10-18 LAB — GLUCOSE, CAPILLARY: Glucose-Capillary: 95 mg/dL (ref 70–99)

## 2013-10-18 LAB — CBC
HCT: 30.2 % — ABNORMAL LOW (ref 36.0–46.0)
Hemoglobin: 10 g/dL — ABNORMAL LOW (ref 12.0–15.0)
MCH: 29.2 pg (ref 26.0–34.0)
MCHC: 33.1 g/dL (ref 30.0–36.0)
MCV: 88.3 fL (ref 78.0–100.0)
RBC: 3.42 MIL/uL — ABNORMAL LOW (ref 3.87–5.11)
RDW: 15.6 % — ABNORMAL HIGH (ref 11.5–15.5)

## 2013-10-18 LAB — BASIC METABOLIC PANEL
BUN: 35 mg/dL — ABNORMAL HIGH (ref 6–23)
CO2: 20 mEq/L (ref 19–32)
Calcium: 9.2 mg/dL (ref 8.4–10.5)
Creatinine, Ser: 1.85 mg/dL — ABNORMAL HIGH (ref 0.50–1.10)
GFR calc Af Amer: 28 mL/min — ABNORMAL LOW (ref >90)
GFR calc non Af Amer: 24 mL/min — ABNORMAL LOW (ref >90)
Glucose, Bld: 51 mg/dL — ABNORMAL LOW (ref 70–99)
Sodium: 148 mEq/L — ABNORMAL HIGH (ref 135–145)

## 2013-10-18 MED ORDER — ENSURE COMPLETE PO LIQD
237.0000 mL | Freq: Two times a day (BID) | ORAL | Status: DC
  Administered 2013-10-18 – 2013-10-21 (×3): 237 mL via ORAL

## 2013-10-18 MED ORDER — DEXTROSE 5 % IV SOLN
INTRAVENOUS | Status: DC
  Administered 2013-10-19: 18:00:00 via INTRAVENOUS
  Administered 2013-10-19 – 2013-10-20 (×2): 1000 mL via INTRAVENOUS
  Administered 2013-10-21: 06:00:00 via INTRAVENOUS

## 2013-10-18 MED ORDER — ASPIRIN 81 MG PO CHEW
81.0000 mg | CHEWABLE_TABLET | Freq: Every day | ORAL | Status: DC
  Administered 2013-10-18 – 2013-10-21 (×4): 81 mg via ORAL
  Filled 2013-10-18 (×4): qty 1

## 2013-10-18 NOTE — Progress Notes (Signed)
INITIAL NUTRITION ASSESSMENT  Pt meets criteria for severe MALNUTRITION in the context of chronic illness as evidenced by 10% weight loss in the past month in addition to pt with severe muscle wasting and subcutaneous fat loss in clavicles, upper arms, and hands.  DOCUMENTATION CODES Per approved criteria  -Severe malnutrition in the context of chronic illness   INTERVENTION: - Ensure Complete BID - Encouraged increased meal intake - Will continue to monitor   NUTRITION DIAGNOSIS: Inadequate oral intake related to poor appetite, confusion as evidenced by <30% meal intake.   Goal: Pt to consume >90% of meals/supplements  Monitor:  Weights, labs, intake  Reason for Assessment: Consult, low braden  79 y.o. female  Admitting Dx: Confusion, agitation, hallucinations  ASSESSMENT: Pt with history of Parkinson's disease, hypertension, hyperlipidemia, and CAD presents with one-week history of worsening agitation, confusion, and hallucinations.   Met with pt and husband who report pt was eating good at home, at least 1 good meal/day at dinner, a sandwich for lunch, and something for breakfast, like oatmeal. Pt was drinking 1 Ensure/day. Pt sometimes has problems swallowing. Pt required assistance with breakfast this morning which husband was helping with however he reports at home pt is usually able to feed herself. Pt's weight down 11 pounds in the past month. Pt c/o feeling weak PTA. Noted pt with severe wasting in clavicles, upper arms, hands, and mild/moderate wasting in temporal/orbital region. Unable to do full nutrition focused physical exam r/t pt eating breakfast. PO intake since admission has been 10-25% of meals.   Height: Ht Readings from Last 1 Encounters:  10/16/13 4\' 8"  (1.422 m)    Weight: Wt Readings from Last 1 Encounters:  10/18/13 95 lb 3.8 oz (43.2 kg)    Ideal Body Weight: 93 lb  % Ideal Body Weight: 102%  Wt Readings from Last 10 Encounters:  10/18/13 95  lb 3.8 oz (43.2 kg)  09/05/13 106 lb (48.081 kg)  08/25/13 106 lb (48.081 kg)  07/13/13 100 lb 12.8 oz (45.723 kg)  06/06/13 104 lb (47.174 kg)  05/25/13 104 lb (47.174 kg)  05/04/13 97 lb (43.999 kg)  03/14/13 104 lb (47.174 kg)  01/20/13 108 lb (48.988 kg)  01/18/13 105 lb (47.628 kg)    Usual Body Weight: 107 lb per husband's report  % Usual Body Weight: 89%  BMI:  Body mass index is 21.36 kg/(m^2).  Estimated Nutritional Needs: Kcal: 1100-1300 Protein: 50-65g Fluid: 1.1-1.3L/day  Skin: Stage 2 sacral pressure ulcer  Diet Order: General  EDUCATION NEEDS: -No education needs identified at this time   Intake/Output Summary (Last 24 hours) at 10/18/13 1112 Last data filed at 10/18/13 0800  Gross per 24 hour  Intake   1065 ml  Output    250 ml  Net    815 ml    Last BM: 12/13  Labs:   Recent Labs Lab 10/16/13 1008 10/17/13 0534 10/18/13 0400  NA 140 144 148*  K 4.3 3.8 3.9  CL 102 109 115*  CO2 23 20 20   BUN 60* 44* 35*  CREATININE 2.70* 2.26* 1.85*  CALCIUM 9.8 9.0 9.2  GLUCOSE 88 74 51*    CBG (last 3)   Recent Labs  10/18/13 0729 10/18/13 0810  GLUCAP 59* 95    Scheduled Meds: . antiseptic oral rinse  15 mL Mouth Rinse q12n4p  . aspirin  81 mg Oral q morning - 10a  . carbidopa-levodopa  1 tablet Oral TID  . chlorhexidine  15 mL  Mouth Rinse BID  . Chlorhexidine Gluconate Cloth  6 each Topical Q0600  . cholecalciferol  2,000 Units Oral Daily  . enoxaparin (LOVENOX) injection  30 mg Subcutaneous Q24H  . mupirocin ointment  1 application Nasal BID  . omega-3 acid ethyl esters  1 g Oral Daily  . pantoprazole  40 mg Oral BID  . sodium chloride  3 mL Intravenous Q12H    Continuous Infusions: . sodium chloride 75 mL (10/17/13 2000)    Past Medical History  Diagnosis Date  . MI, old   . Parkinson disease   . Scoliosis   . Arthritis   . Bladder cancer   . HTN (hypertension) 04/21/2012  . Hyperlipidemia 04/21/2012  . DM (diabetes  mellitus) 04/21/2012  . CAD (coronary artery disease) 04/21/2012  . Barrett esophagus 04/21/2012  . PUD (peptic ulcer disease) 04/21/2012  . Adrenal insufficiency 04/21/2012  . Raynaud's disease 04/21/2012  . Anxiety 04/21/2012  . Fibromyalgia 04/21/2012  . Hx of bladder cancer 04/21/2012  . Edema of lower extremity 04/21/2012    Chronic lower extremity edema  . Falls 04/21/2012  . Recurrent UTI 01/20/2013    Past Surgical History  Procedure Laterality Date  . Tonsillectomy    . Appendectomy    . Abdominal hysterectomy      Levon Hedger MS, RD, LDN [phone] Pager [phone] After Hours Pager

## 2013-10-18 NOTE — Progress Notes (Signed)
Pt admitted to a step down bed in the ICU at about 2000, pt very drowsy and difficult to wake up on admission. No jerky movement noted, pt became alert at about 0400 and remained calm through the rest of the night. Will continue to monitor.

## 2013-10-18 NOTE — Progress Notes (Signed)
Patient ID: [name], female   DOB: 21-Dec-1927, 79 y.o.   MRN: [id number] [facility] PROGRESS NOTE  [name] [id number] DOB: [birth year] DOA: 10/16/2013 PCP: Allean Found, MD  Brief narrative:  79 year old female with a history of Parkinson's disease, hypertension, hyperlipidemia, and CAD presented with one-week history of worsening agitation, confusion, and hallucinations. A little over one week prior to admission, the patient's sleeping medicine was changed to Remeron. She was previously taking Restoril. After one dose, she had worsening confusion following day. Her primary care physician was contacted. The patient was instructed to continue her Remeron as an adequate trial to see if it was indeed causing the confusion and agitation. In addition, the patient's neurologist has also been trying to wean the patient's amantadine. She has been taking it every 48 hours rather than daily. For the past 3 days, there has been increasing hallucinations and paranoia. The patient is a poor historian. All of this history was obtained from speaking with the patient's husband. There've been no reports of fevers, chills, chest discomfort, nausea, vomiting, diarrhea, abdominal pain. At baseline, the patient has intermittent hallucinations and confusion on a "good day".   In the ED, the patient received 500 cc normal saline. She was hemodynamically stable. Serum creatinine was noted to be 2.70. CBC was unremarkable. Urinalysis was negative for any pyuria. EKG showed sinus rhythm with left anterior fascicular block which has been unchanged. CT of brain was negative.   Assessment/Plan:  Acute encephalopathy  - Multifactorial including the patient's AKI and medications - Discontinued mirapex and amantidine as these can cause increase hallucination and aggitation in PD patient whom already had hallucinations  - pt is more alert this AM and follows commands appropriately  - TSH is within normal  limits  - OOB to chair today  Acute on Chronic Renal Failure (CKD IV)  - Baseline creatinine 1.3-1.4  - Cr is still above baseline but is trending down 2.7 --> 1.85 this AM - Continue IV fluids as pt is responding well and Cr trending down  - continue to hold Lasix (pt takes at home) Hypernatremia - secondary to pre renal etiology - continue IVF and repeat BMP in AM Constipation - place on bowel regimen - this was discussed in detail with husband at bedside  Grade 2 diastolic CHF - most recent 2  D ECHO 09/2013 with grade I diastolic dysfunction - continue to hold Lasix as pt is still dry and below baseline weight of 105 lbs - weight this AM 95 lbs - gentle hydration with NS and encourage PO intake  - monitor I's and O's, daily weights  Parkinson's disease  - Continue Sinemet  Hypertension  - continue home medical regimen but continue to hold Lasix as noted above  Coronary artery disease  - Stable  - EKG negative for ST-T wave changes  - Continue aspirin 81 mg daily  Functional quadriplegia  - secondary to progressive dementia and PD  - PT evaluation pending Severe malnutrition  - secondary to the above FFT, PD, dementia  - nutrition consultation   Consultants:  None Procedures/Studies:  Ct Head Wo Contrast 10/16/2013 Chronic and involutional changes without evidence of acute abnormalities.  Dg Chest Port 1 View 10/16/2013 Poor inspiratory effort. Likely mild vascular congestion.  Antibiotics:  None  Code Status: Full  Family Communication: Pt and husband at bedside  Disposition Plan: PT evaluation pending, likely home when ready, plan on transferring to medical floor in 24 hours  HPI/Subjective: No events overnight.   Objective: Filed Vitals:   10/18/13 0400 10/18/13 0500 10/18/13 0600 10/18/13 0800  BP: 159/82 130/60 154/53   Pulse: 37 66 33   Temp: 99.2 F (37.3 C)   97.5 F (36.4 C)  TempSrc: Axillary   Oral  Resp: 25 24 26    Height:      Weight: 43.2  kg (95 lb 3.8 oz)     SpO2: 93% 92% 93%     Intake/Output Summary (Last 24 hours) at 10/18/13 0919 Last data filed at 10/18/13 0800  Gross per 24 hour  Intake   1305 ml  Output    250 ml  Net   1055 ml    Exam:   General:  Pt is alert, follows commands appropriately, not in acute distress  Cardiovascular: Regular rate and rhythm, SEM 4/6 with S4, no rubs, no gallops  Respiratory: Clear to auscultation bilaterally, no wheezing, no crackles, no rhonchi  Abdomen: Soft, non tender, non distended, bowel sounds present, no guarding  Extremities: No edema, pulses DP and PT palpable bilaterally  Data Reviewed: Basic Metabolic Panel:  Recent Labs Lab 10/16/13 1008 10/17/13 0534 10/18/13 0400  NA 140 144 148*  K 4.3 3.8 3.9  CL 102 109 115*  CO2 23 20 20   GLUCOSE 88 74 51*  BUN 60* 44* 35*  CREATININE 2.70* 2.26* 1.85*  CALCIUM 9.8 9.0 9.2    Recent Labs Lab 10/16/13 1627  AMMONIA 28   CBC:  Recent Labs Lab 10/16/13 1008 10/18/13 0400  WBC 6.2 6.5  NEUTROABS 4.2  --   HGB 11.0* 10.0*  HCT 32.5* 30.2*  MCV 85.5 88.3  PLT 325 295   CBG:  Recent Labs Lab 10/18/13 0729 10/18/13 0810  GLUCAP 59* 95    Recent Results (from the past 240 hour(s))  MRSA PCR SCREENING     Status: Abnormal   Collection Time    10/17/13  8:21 PM      Result Value Range Status   MRSA by PCR POSITIVE (*) NEGATIVE Final   Comment:            The GeneXpert MRSA Assay (FDA     approved for NASAL specimens     only), is one component of a     comprehensive MRSA colonization     surveillance program. It is not     intended to diagnose MRSA     infection nor to guide or     monitor treatment for     MRSA infections.     RESULT CALLED TO, READ BACK BY AND VERIFIED WITH:     MBEMENA,Q/2W @2305  ON 10/17/13 BY KARCZEWSKI,S.     Scheduled Meds: . antiseptic oral rinse  15 mL Mouth Rinse q12n4p  . aspirin  81 mg Oral q morning - 10a  . carbidopa-levodopa  1 tablet Oral TID  .  chlorhexidine  15 mL Mouth Rinse BID  . Chlorhexidine Gluconate Cloth  6 each Topical Q0600  . cholecalciferol  2,000 Units Oral Daily  . enoxaparin (LOVENOX) injection  30 mg Subcutaneous Q24H  . mupirocin ointment  1 application Nasal BID  . omega-3 acid ethyl esters  1 g Oral Daily  . pantoprazole  40 mg Oral BID  . sodium chloride  3 mL Intravenous Q12H   Continuous Infusions: . sodium chloride 75 mL (10/17/13 2000)   Debbora Presto, MD  [facility] Pager [phone]  If 7PM-7AM, please contact night-coverage [email or web address] Password [email or web address] 10/18/2013,  9:19 AM   LOS: 2 days

## 2013-10-18 NOTE — Progress Notes (Signed)
PT Cancellation Note  Patient Details Name: [name] MRN: [id number] DOB: June 04, 1928   Cancelled Treatment:    Reason Eval/Treat Not Completed: Medical issues which prohibited therapy, pt moved to SDU with jerking movements.   Rada Hay 10/18/2013, 1:07 PM Blanchard Kelch PT [phone]

## 2013-10-19 DIAGNOSIS — E860 Dehydration: Secondary | ICD-10-CM

## 2013-10-19 DIAGNOSIS — E2749 Other adrenocortical insufficiency: Secondary | ICD-10-CM

## 2013-10-19 LAB — CBC
MCH: 29.3 pg (ref 26.0–34.0)
MCV: 86.9 fL (ref 78.0–100.0)
Platelets: 248 10*3/uL (ref 150–400)
RBC: 3.28 MIL/uL — ABNORMAL LOW (ref 3.87–5.11)
RDW: 15.6 % — ABNORMAL HIGH (ref 11.5–15.5)
WBC: 6.8 10*3/uL (ref 4.0–10.5)

## 2013-10-19 LAB — BASIC METABOLIC PANEL
CO2: 21 mEq/L (ref 19–32)
Calcium: 8.9 mg/dL (ref 8.4–10.5)
Creatinine, Ser: 1.62 mg/dL — ABNORMAL HIGH (ref 0.50–1.10)
GFR calc Af Amer: 33 mL/min — ABNORMAL LOW (ref >90)
GFR calc non Af Amer: 28 mL/min — ABNORMAL LOW (ref >90)
Potassium: 4 mEq/L (ref 3.5–5.1)
Sodium: 139 mEq/L (ref 135–145)

## 2013-10-19 MED ORDER — LORAZEPAM 0.5 MG PO TABS
0.5000 mg | ORAL_TABLET | Freq: Every evening | ORAL | Status: DC | PRN
  Administered 2013-10-19: 0.5 mg via ORAL
  Filled 2013-10-19: qty 1

## 2013-10-19 NOTE — Evaluation (Signed)
Physical Therapy Evaluation Patient Details Name: ROSEMOND LYTTLE MRN: [id number] DOB: [birth year] Today's Date: 10/19/2013 Time: 4098-1191 PT Time Calculation (min): 43 min  PT Assessment / Plan / Recommendation History of Present Illness  Pt is a 79 y.o. female presenting with frequent falls and acute kidney injury . PMH is significant for chronic renal failure and Parkinson's  Clinical Impression  Pt tolerated activity/ambulation better than anticipated. Spouse aware of possible need for SNF as pt is extensive assist at present. Pt will benefit from PT to address problems.    PT Assessment  Patient needs continued PT services    Follow Up Recommendations  SNF    Does the patient have the potential to tolerate intense rehabilitation      Barriers to Discharge Decreased caregiver support      Equipment Recommendations  None recommended by PT    Recommendations for Other Services     Frequency Min 3X/week    Precautions / Restrictions Precautions Precautions: Fall   Pertinent Vitals/Pain HR 106 sats >90%      Mobility  Bed Mobility Bed Mobility: Supine to Sit;Sitting - Scoot to Edge of Bed Supine to Sit: 3: Mod assist Sitting - Scoot to Edge of Bed: 3: Mod assist Details for Bed Mobility Assistance: extra time for following directions., tactile cues/gestures.  Transfers Transfers: Sit to Stand;Stand to Sit Sit to Stand: 1: +2 Total assist;From bed;From chair/3-in-1;With upper extremity assist Sit to Stand: Patient Percentage: 40% Stand to Sit: 1: +2 Total assist Stand to Sit: Patient Percentage: 50% Details for Transfer Assistance: guide to stand and sit, hand over hand  cues to sit down, Pt very unsteady upon standing up, narrow base. Ambulation/Gait Ambulation/Gait Assistance: 1: +2 Total assist Ambulation/Gait: Patient Percentage: 60% Ambulation Distance (Feet): 80 Feet (then 130) Assistive device: 4-wheeled walker Ambulation/Gait Assistance Details: Initiall  festinating gait , decreased  weight shift, gradually improved in gait sequence. festinating when started ambulating second time. Gait Pattern: Step-to pattern;Step-through pattern;Trunk flexed;Festinating;Narrow base of support Gait velocity: decr    Exercises     PT Diagnosis: Difficulty walking;Generalized weakness  PT Problem List: Decreased strength;Decreased activity tolerance;Decreased balance;Decreased mobility;Decreased knowledge of precautions;Decreased safety awareness;Decreased cognition;Decreased knowledge of use of DME PT Treatment Interventions: DME instruction;Gait training;Functional mobility training;Therapeutic activities;Patient/family education     PT Goals(Current goals can be found in the care plan section) Acute Rehab PT Goals Patient Stated Goal: per spouse, to get her back to where she was. PT Goal Formulation: With family Time For Goal Achievement: 11/02/13 Potential to Achieve Goals: Good  Visit Information  Last PT Received On: 10/19/13 Assistance Needed: +2 (equipment.) History of Present Illness: Pt is a 79 y.o. female presenting with frequent falls and acute kidney injury . PMH is significant for chronic renal failure and Parkinson's       Prior Functioning  Home Living Family/patient expects to be discharged to:: Private residence Living Arrangements: Spouse/significant other Available Help at Discharge: Family;Available 24 hours/day;Personal care attendant (4 hours 2 days aweek was to start.) Type of Home: Other(Comment) Home Access: Level entry Home Layout: One level Home Equipment: Shower seat - built in;Walker - 4 wheels;Wheelchair - manual Prior Function Level of Independence: Needs assistance ADL's / Homemaking Assistance Needed: spouse sets up shower, pt was able to perform without assist.    Cognition  Cognition Behavior During Therapy: [facility] for tasks assessed/performed Overall Cognitive Status: Impaired/Different from baseline Area of  Impairment: Orientation;Memory;Following commands;Awareness Orientation Level: Place;Time;Situation General Comments: spouse reports that pt  is much more confused.    Extremity/Trunk Assessment Upper Extremity Assessment Upper Extremity Assessment: RUE deficits/detail;LUE deficits/detail RUE Deficits / Details: decreased shoulder elevation. LUE Deficits / Details: same as R Lower Extremity Assessment Lower Extremity Assessment: Generalized weakness Cervical / Trunk Assessment Cervical / Trunk Assessment: Kyphotic (scoliotic)   Balance Balance Balance Assessed: Yes Static Sitting Balance Static Sitting - Balance Support: Bilateral upper extremity supported Static Sitting - Level of Assistance: 5: Stand by assistance Static Standing Balance Static Standing - Balance Support: Bilateral upper extremity supported Static Standing - Level of Assistance: 3: Mod assist;2: Max assist Static Standing - Comment/# of Minutes: tends to lean posteriorly  End of Session PT - End of Session Equipment Utilized During Treatment: Gait belt Activity Tolerance: Patient tolerated treatment well Patient left: in chair;with call bell/phone within reach;with family/visitor present Nurse Communication: Mobility status  GP     Rada Hay 10/19/2013, 12:33 PM Blanchard Kelch PT [phone]

## 2013-10-19 NOTE — Progress Notes (Addendum)
Patient ID: [name], female   DOB: 01/17/1928, 79 y.o.   MRN: [id number] [facility] PROGRESS NOTE  [name] [id number]:[id number] DOB: 1928-03-22 DOA: 10/16/2013 PCP: Allean Found, MD  Brief narrative:  79 year old female with a history of Parkinson's disease, hypertension, hyperlipidemia, and CAD presented with one-week history of worsening agitation, confusion, and hallucinations. A little over one week prior to admission, the patient's sleeping medicine was changed to Remeron. She was previously taking Restoril. After one dose, she had worsening confusion following day. Her primary care physician was contacted. The patient was instructed to continue her Remeron as an adequate trial to see if it was indeed causing the confusion and agitation. In addition, the patient's neurologist has also been trying to wean the patient's amantadine. She has been taking it every 48 hours rather than daily. For the past 3 days, there has been increasing hallucinations and paranoia. The patient is a poor historian. All of this history was obtained from speaking with the patient's husband. There've been no reports of fevers, chills, chest discomfort, nausea, vomiting, diarrhea, abdominal pain. At baseline, the patient has intermittent hallucinations and confusion on a "good day".   In the ED, the patient received 500 cc normal saline. She was hemodynamically stable. Serum creatinine was noted to be 2.70. CBC was unremarkable. Urinalysis was negative for any pyuria. EKG showed sinus rhythm with left anterior fascicular block which has been unchanged. CT of brain was negative.    Assessment/Plan:  Acute encephalopathy  - Multifactorial, parkinson's meds, acute renal failure likely contributors.   - Mirapex and amantidine were held given hallucinations - pt is more alert this AM and follows commands appropriately  Acute on Chronic Renal Failure (CKD IV)  - Baseline creatinine 1.3-1.4  - Cr improving  to 1.62 from 1.85.   - Continue IV fluids  - continue to hold Lasix Hypernatremia - secondary to dehydration - Sodium 139 on this am's labs Constipation - place on bowel regimen - this was discussed in detail with husband at bedside  Grade 2 diastolic CHF - most recent 2  D ECHO 09/2013 with grade I diastolic dysfunction - continue to hold Lasix as pt is still dry and below baseline weight of 105 lbs - weight this AM 95 lbs - gentle hydration with NS and encourage PO intake  - monitor I's and O's, daily weights  Parkinson's disease  - Continue Sinemet  Hypertension  - continue home medical regimen but continue to hold Lasix as noted above  Coronary artery disease  - Stable  - EKG negative for ST-T wave changes  - Continue aspirin 81 mg daily  Functional quadriplegia  - secondary to progressive dementia and PD  - PT evaluation pending Severe malnutrition  - secondary to the above FFT, PD, dementia  - nutrition consultation   Consultants:  None Procedures/Studies:  Ct Head Wo Contrast 10/16/2013 Chronic and involutional changes without evidence of acute abnormalities.  Dg Chest Port 1 View 10/16/2013 Poor inspiratory effort. Likely mild vascular congestion.  Antibiotics:  None  Code Status: Full  Family Communication: I discussed short term rehab with husband at bedside.  Disposition Plan: SW consult placed for NH placement. I discussed SNF placement for rehab with husband who agrees.   HPI/Subjective: No events overnight.   Objective: Filed Vitals:   10/19/13 0547 10/19/13 0600 10/19/13 0754 10/19/13 0800  BP:  168/72  150/67  Pulse:   59 58  Temp:    97.2 F (36.2 C)  TempSrc:    Oral  Resp:  24 24 21   Height:      Weight: 36 kg (79 lb 5.9 oz)     SpO2:   94% 95%    Intake/Output Summary (Last 24 hours) at 10/19/13 1134 Last data filed at 10/19/13 1000  Gross per 24 hour  Intake   2360 ml  Output    500 ml  Net   1860 ml    Exam:   General:  Pt is  alert, follows commands appropriately, not in acute distress  Cardiovascular: Regular rate and rhythm, SEM 4/6 with S4, no rubs, no gallops  Respiratory: Clear to auscultation bilaterally, no wheezing, no crackles, no rhonchi  Abdomen: Soft, non tender, non distended, bowel sounds present, no guarding  Extremities: No edema, pulses DP and PT palpable bilaterally  Data Reviewed: Basic Metabolic Panel:  Recent Labs Lab 10/16/13 1008 10/17/13 0534 10/18/13 0400  NA 140 144 148*  K 4.3 3.8 3.9  CL 102 109 115*  CO2 23 20 20   GLUCOSE 88 74 51*  BUN 60* 44* 35*  CREATININE 2.70* 2.26* 1.85*  CALCIUM 9.8 9.0 9.2    Recent Labs Lab 10/16/13 1627  AMMONIA 28   CBC:  Recent Labs Lab 10/16/13 1008 10/18/13 0400 10/19/13 0330  WBC 6.2 6.5 6.8  NEUTROABS 4.2  --   --   HGB 11.0* 10.0* 9.6*  HCT 32.5* 30.2* 28.5*  MCV 85.5 88.3 86.9  PLT 325 295 248   CBG:  Recent Labs Lab 10/18/13 0729 10/18/13 0810  GLUCAP 59* 95    Recent Results (from the past 240 hour(s))  MRSA PCR SCREENING     Status: Abnormal   Collection Time    10/17/13  8:21 PM      Result Value Range Status   MRSA by PCR POSITIVE (*) NEGATIVE Final   Comment:            The GeneXpert MRSA Assay (FDA     approved for NASAL specimens     only), is one component of a     comprehensive MRSA colonization     surveillance program. It is not     intended to diagnose MRSA     infection nor to guide or     monitor treatment for     MRSA infections.     RESULT CALLED TO, READ BACK BY AND VERIFIED WITH:     MBEMENA,Q/2W @2305  ON 10/17/13 BY KARCZEWSKI,S.     Scheduled Meds: . antiseptic oral rinse  15 mL Mouth Rinse q12n4p  . aspirin  81 mg Oral Daily  . carbidopa-levodopa  1 tablet Oral TID  . chlorhexidine  15 mL Mouth Rinse BID  . Chlorhexidine Gluconate Cloth  6 each Topical Q0600  . cholecalciferol  2,000 Units Oral Daily  . enoxaparin (LOVENOX) injection  30 mg Subcutaneous Q24H  . feeding  supplement (ENSURE COMPLETE)  237 mL Oral BID BM  . mupirocin ointment  1 application Nasal BID  . omega-3 acid ethyl esters  1 g Oral Daily  . pantoprazole  40 mg Oral BID  . sodium chloride  3 mL Intravenous Q12H   Continuous Infusions: . dextrose 1,000 mL (10/19/13 0218)   Jeralyn Bennett, MD  [facility] Pager [phone]  If 7PM-7AM, please contact night-coverage [email or web address] Password [email or web address] 10/19/2013, 11:34 AM   LOS: 3 days

## 2013-10-20 DIAGNOSIS — F19921 Other psychoactive substance use, unspecified with intoxication with delirium: Secondary | ICD-10-CM

## 2013-10-20 DIAGNOSIS — T50904A Poisoning by unspecified drugs, medicaments and biological substances, undetermined, initial encounter: Secondary | ICD-10-CM

## 2013-10-20 DIAGNOSIS — E43 Unspecified severe protein-calorie malnutrition: Secondary | ICD-10-CM

## 2013-10-20 LAB — CBC
Hemoglobin: 11.2 g/dL — ABNORMAL LOW (ref 12.0–15.0)
Platelets: 271 10*3/uL (ref 150–400)
RBC: 3.85 MIL/uL — ABNORMAL LOW (ref 3.87–5.11)
RDW: 15.4 % (ref 11.5–15.5)

## 2013-10-20 LAB — BASIC METABOLIC PANEL
GFR calc Af Amer: 41 mL/min — ABNORMAL LOW (ref >90)
GFR calc non Af Amer: 36 mL/min — ABNORMAL LOW (ref >90)
Glucose, Bld: 102 mg/dL — ABNORMAL HIGH (ref 70–99)
Potassium: 4 mEq/L (ref 3.5–5.1)
Sodium: 134 mEq/L — ABNORMAL LOW (ref 135–145)

## 2013-10-20 NOTE — Progress Notes (Signed)
[facility] PROGRESS NOTE  [name] UJW:[id number] DOB: [birth year] DOA: 10/16/2013 PCP: Allean Found, MD  Assessment/Plan: 1. Acute encephalopathy. Likely multifactorial with dehydration, acute renal failure, Parkinson's meds all probable contributors. On admission her Mirapex and amantadine were held with her hallucinations. She has had significant clinical improvement in the last 24-48 hours however her husband reporting that she is still somewhat confused. Today she ambulated down the hallway with physical therapy. 2. Parkinson's disease. I discussed case with her neurologist Dr. [name] Furbish who agreed with continuing to hold her amantadine and Mirapex for now. She recommended continuing carbidopa/levodopa 3 times a day. Patient will follow up with her in the outpatient setting.  3. Dehydration. Likely secondary to decreased by mouth intake in setting of acute encephalopathy, improved, patient tolerating by mouth intake, now off of IV fluids 4. Acute on chronic renal failure, likely secondary to prerenal azotemia. Her creatinine coming down to 1.3 on this mornings lab work. 5. Hypernatremia. Likely secondary to dehydration. Sodium at 134 on this mornings lab work. 6. Chronic diastolic congestive heart failure. Status post transthoracic echocardiogram in November 2014 showed grade 1 diastolic dysfunction. Given the presence of dehydration her Lasix was held on admission. She presently does not show clinical signs or symptoms of fluid overload. 7. Protein calorie malnutrition. Continue encouraging by mouth intake, protein boost 3 times a day  Code Status: Full code Family Communication: Patient's husband was updated today Disposition Plan: Social work consult, plan for transition to skilled nursing facility for   Consultants:  Dr.Athar of neurology over telephone conversation    HPI/Subjective: Patient appears improved today, she ambulated down the hallway with physical  therapy. Other present at bedside, updated on patient's condition.  Objective: Filed Vitals:   10/20/13 1400  BP: 162/82  Pulse: 58  Temp: 97.7 F (36.5 C)  Resp: 16    Intake/Output Summary (Last 24 hours) at 10/20/13 1548 Last data filed at 10/20/13 1400  Gross per 24 hour  Intake   2125 ml  Output   1000 ml  Net   1125 ml   Filed Weights   10/16/13 1553 10/18/13 0400 10/19/13 0547  Weight: 45.36 kg (100 lb) 43.2 kg (95 lb 3.8 oz) 36 kg (79 lb 5.9 oz)    Exam:   General:  Patient is awake alert, able to tell me that this is [facility], however confused unable to hospital.  Cardiovascular: 36 systolic ejection murmur, normal S1-S2  Respiratory: Lungs are clear to auscultation bilaterally no wheezing rhonchi or rales  Abdomen: Soft nontender nondistended positive bowels  Musculoskeletal: No edema  Data Reviewed: Basic Metabolic Panel:  Recent Labs Lab 10/16/13 1008 10/17/13 0534 10/18/13 0400 10/19/13 0330 10/20/13 0514  NA 140 144 148* 139 134*  K 4.3 3.8 3.9 4.0 4.0  CL 102 109 115* 110 102  CO2 23 20 20 21 21   GLUCOSE 88 74 51* 124* 102*  BUN 60* 44* 35* 33* 25*  CREATININE 2.70* 2.26* 1.85* 1.62* 1.34*  CALCIUM 9.8 9.0 9.2 8.9 8.9   Liver Function Tests: No results found for this basename: AST, ALT, ALKPHOS, BILITOT, PROT, ALBUMIN,  in the last 168 hours No results found for this basename: LIPASE, AMYLASE,  in the last 168 hours  Recent Labs Lab 10/16/13 1627  AMMONIA 28   CBC:  Recent Labs Lab 10/16/13 1008 10/18/13 0400 10/19/13 0330 10/20/13 0514  WBC 6.2 6.5 6.8 7.3  NEUTROABS 4.2  --   --   --  HGB 11.0* 10.0* 9.6* 11.2*  HCT 32.5* 30.2* 28.5* 33.6*  MCV 85.5 88.3 86.9 87.3  PLT 325 295 248 271   Cardiac Enzymes: No results found for this basename: CKTOTAL, CKMB, CKMBINDEX, TROPONINI,  in the last 168 hours BNP (last 3 results)  Recent Labs  11/20/12 1548 09/05/13 0908  PROBNP 2104.0* 1649.0*    CBG:  Recent Labs Lab 10/18/13 0729 10/18/13 0810  GLUCAP 59* 95    Recent Results (from the past 240 hour(s))  MRSA PCR SCREENING     Status: Abnormal   Collection Time    10/17/13  8:21 PM      Result Value Range Status   MRSA by PCR POSITIVE (*) NEGATIVE Final   Comment:            The GeneXpert MRSA Assay (FDA     approved for NASAL specimens     only), is one component of a     comprehensive MRSA colonization     surveillance program. It is not     intended to diagnose MRSA     infection nor to guide or     monitor treatment for     MRSA infections.     RESULT CALLED TO, READ BACK BY AND VERIFIED WITH:     MBEMENA,Q/2W @2305  ON 10/17/13 BY KARCZEWSKI,S.     Studies: No results found.  Scheduled Meds: . antiseptic oral rinse  15 mL Mouth Rinse q12n4p  . aspirin  81 mg Oral Daily  . carbidopa-levodopa  1 tablet Oral TID  . Chlorhexidine Gluconate Cloth  6 each Topical Q0600  . cholecalciferol  2,000 Units Oral Daily  . enoxaparin (LOVENOX) injection  30 mg Subcutaneous Q24H  . feeding supplement (ENSURE COMPLETE)  237 mL Oral BID BM  . mupirocin ointment  1 application Nasal BID  . omega-3 acid ethyl esters  1 g Oral Daily  . pantoprazole  40 mg Oral BID  . sodium chloride  3 mL Intravenous Q12H   Continuous Infusions: . dextrose 1,000 mL (10/20/13 0355)    Active Problems:   HTN (hypertension)   Parkinson's disease   Acute encephalopathy   Acute on chronic renal failure   GERD (gastroesophageal reflux disease)   Protein-calorie malnutrition, severe    Time spent: 35 minutes    Jeralyn Bennett  [facility] Pager [phone]. If 7PM-7AM, please contact night-coverage at [email or web address], password [facility] 10/20/2013, 3:48 PM  LOS: 4 days

## 2013-10-20 NOTE — Progress Notes (Signed)
CSW assisting with d/c planning. SNF bed offers provided to spouse. He is reviewing list. CSW will continue to follow to assist with d/cplanning needs.  Cori Razor LCSW [phone]

## 2013-10-20 NOTE — Progress Notes (Signed)
Pt is very anxious, wanting to get out of bed and get ready to go to church. Called her husband at 106 causing some distress for him. Stayed in the room for a long while  Trying to orient her to present.  She has a curvature of the spine with noted RA in both of her hands, generalized stiffness all over. She does wear a wig, causing her head to itch.

## 2013-10-20 NOTE — Progress Notes (Signed)
Clinical Social Work Department BRIEF PSYCHOSOCIAL ASSESSMENT 10/20/2013  Patient:  [name], [name]     Account Number:  [id number]     Admit date:  10/16/2013  Clinical Social Worker:  Jacelyn Grip  Date/Time:  10/19/2013 04:30 PM  Referred by:  Physician  Date Referred:  10/19/2013 Referred for  SNF Placement   Other Referral:   Interview type:  Patient Other interview type:   and patient spouse at bedside    PSYCHOSOCIAL DATA Living Status:  HUSBAND Admitted from facility:   Level of care:   Primary support name:  Chrissie Noa Drummer/husband/[phone] Primary support relationship to patient:  SPOUSE Degree of support available:   strong    CURRENT CONCERNS Current Concerns  Post-Acute Placement   Other Concerns:    SOCIAL WORK ASSESSMENT / PLAN CSW received referral for New SNF.    CSW met with pt and pt spouse at bedside. CSW introduced self and explained role. CSW discussed recommendation for ST SNF placement. Pt discussed that she feels rehab will be beneficial as she feels that if she went home then she would get comfortable in a routine and not do the exercises recommended. Pt husband discussed that they are hoping to hire a private duty aide when pt is ready to return home from rehab facility in order for pt to continue progressing with physical therapy.    Pt husband stated that pt has been at [facility] in the past, but the facility was not convenient to pt home. Pt and pt husband expressed interest in [facility], but agreeable to [facility] search.    CSW completed FL2 and initiated SNF search to [facility]. CSW contacted [facility] and notified facility of pt interest.    CSW to follow up with pt and pt husband re: bed offers.    CSW to continue to follow and facilitate pt discharge needs when pt medically ready for discharge.   Assessment/plan status:  Psychosocial Support/Ongoing Assessment of Needs Other assessment/ plan:   discharge  planning   Information/referral to community resources:   [facility] search    PATIENT'S/FAMILY'S RESPONSE TO PLAN OF CARE: Pt alert and oriented x 2. Pt displayed periods of confusion, but able to appropriately communicate in assessment at time. Pt and pt spouse recognize that pt needs rehab at SNF at discharge. Pt appears to be coping appropriately with plan to transition to SNF.    [name], MSW, LCSW Clinical Social Work [phone]

## 2013-10-20 NOTE — Progress Notes (Signed)
Spouse has chosen [facility] for SNF placement. CSW will continue to follow to assist with d/c planning to SNF when medically stable.  Cori Razor LCSW [phone]

## 2013-10-20 NOTE — Progress Notes (Signed)
Physical Therapy Treatment Patient Details Name: [name] MRN: [id number] DOB: 1928-01-08 Today's Date: 10/20/2013 Time: 4098-1191 PT Time Calculation (min): 39 min  PT Assessment / Plan / Recommendation  History of Present Illness Pt is a 79 y.o. female presenting with frequent falls and acute kidney injury . PMH is significant for chronic renal failure and Parkinson's   PT Comments   Spouse present during session and was very helpful.  Assisted pt OOB to amb to BR.  Assisted with hygiene.  Assisted with amb in hallway.  Very unsteady gait and limited activity tolerance.  Pt will need ST Rehab at SNF prior to D/C to home.   Follow Up Recommendations  SNF ([facility])     Does the patient have the potential to tolerate intense rehabilitation     Barriers to Discharge        Equipment Recommendations  None recommended by PT    Recommendations for Other Services    Frequency Min 3X/week   Progress towards PT Goals Progress towards PT goals: Progressing toward goals  Plan      Precautions / Restrictions   Parkinson's HIGH FALL RISK  Pertinent Vitals/Pain     Mobility  Bed Mobility Bed Mobility: Supine to Sit;Sitting - Scoot to Edge of Bed Supine to Sit: 3: Mod assist Sitting - Scoot to Edge of Bed: 3: Mod assist Details for Bed Mobility Assistance: extra time needed to complete task, slow moving Transfers Transfers: Sit to Stand;Stand to Sit Sit to Stand: 2: Max assist;3: Mod assist;From bed;From toilet;From chair/3-in-1 Stand to Sit: 3: Mod assist;2: Max assist;To chair/3-in-1;To toilet Details for Transfer Assistance: guide to stand and sit, hand over hand  cues to sit down, Pt very unsteady upon standing up, narrow base.  Difficulty completing turns.  Staggered Parkinson's shuffle. Ambulation/Gait Ambulation/Gait Assistance: 1: +2 Total assist Ambulation/Gait: Patient Percentage: 60% Ambulation Distance (Feet): 74 Feet Assistive device: 2 person hand held  assist Ambulation/Gait Assistance Details: + 2 hand held assist very unsteady gait with narrow BOS and shuffled Parkinson's gait.  Poor kyphotic posture. Gait Pattern: Step-to pattern;Step-through pattern;Trunk flexed;Festinating;Narrow base of support Gait velocity: decreased General Gait Details: HIGH FALL RISK     PT Goals (current goals can now be found in the care plan section)    Visit Information  Last PT Received On: 10/20/13 Assistance Needed: +1 History of Present Illness: Pt is a 79 y.o. female presenting with frequent falls and acute kidney injury . PMH is significant for chronic renal failure and Parkinson's    Subjective Data      Cognition       Balance     End of Session PT - End of Session Equipment Utilized During Treatment: Gait belt Activity Tolerance: Patient tolerated treatment well Patient left: in chair;with call bell/phone within reach;with family/visitor present Nurse Communication: Mobility status   [name]  PTA [facility]  Acute  Rehab Pager      [phone]

## 2013-10-20 NOTE — Progress Notes (Addendum)
Clinical Social Work Department CLINICAL SOCIAL WORK PLACEMENT NOTE 10/20/2013  Patient:  [name], [name]  Account Number:  [id number] Admit date:  10/16/2013  Clinical Social Worker:  Jacelyn Grip  Date/time:  10/19/2013 04:30 PM  Clinical Social Work is seeking post-discharge placement for this patient at the following level of care:   SKILLED NURSING   (*CSW will update this form in Epic as items are completed)   10/19/2013  Patient/family provided with [facility] Department of Clinical Social Work's list of facilities offering this level of care within the geographic area requested by the patient (or if unable, by the patient's family).  10/19/2013  Patient/family informed of their freedom to choose among providers that offer the needed level of care, that participate in Medicare, Medicaid or managed care program needed by the patient, have an available bed and are willing to accept the patient.  10/19/2013  Patient/family informed of [facility]' ownership interest in [facility], as well as of the fact that they are under no obligation to receive care at this facility.  PASARR submitted to EDS on 10/19/2013 PASARR number received from [employer] on 10/19/2013  FL2 transmitted to all facilities in geographic area requested by pt/family on  10/19/2013 FL2 transmitted to all facilities within larger geographic area on   Patient informed that his/her managed care company has contracts with or will negotiate with  certain facilities, including the following:     Patient/family informed of bed offers received:   Patient chooses bed at  Physician recommends and patient chooses bed at    Patient to be transferred to  on   Patient to be transferred to facility by   The following physician request were entered in Epic:   Additional Comments:    Jacklynn Lewis, MSW, LCSW Clinical Social Work [phone]

## 2013-10-21 DIAGNOSIS — R269 Unspecified abnormalities of gait and mobility: Secondary | ICD-10-CM

## 2013-10-21 DIAGNOSIS — R5381 Other malaise: Secondary | ICD-10-CM

## 2013-10-21 DIAGNOSIS — I10 Essential (primary) hypertension: Secondary | ICD-10-CM

## 2013-10-21 MED ORDER — MUPIROCIN 2 % EX OINT: 1.0000 "application " | TOPICAL_OINTMENT | Freq: Two times a day (BID) | CUTANEOUS | Status: DC

## 2013-10-21 MED ORDER — FUROSEMIDE 40 MG PO TABS: 20.0000 mg | ORAL_TABLET | Freq: Every day | ORAL | Status: DC

## 2013-10-21 MED ORDER — LORAZEPAM 0.5 MG PO TABS: 0.5000 mg | ORAL_TABLET | Freq: Three times a day (TID) | ORAL | Status: DC | PRN

## 2013-10-21 NOTE — Discharge Summary (Addendum)
Physician Discharge Summary  JAIDON SPONSEL ZOX:[id number] DOB: 1928/03/31 DOA: 10/16/2013  PCP: Allean Found, MD  Admit date: 10/16/2013 Discharge date: 10/21/2013  Time spent: 35 minutes  Recommendations for Outpatient Follow-up:  1. Follow up on repeat BMP in 3-4 days  Discharge Diagnoses:  Active Problems:   HTN (hypertension)   Parkinson's disease   Acute encephalopathy   Acute on chronic renal failure   GERD (gastroesophageal reflux disease)   Protein-calorie malnutrition, severe   Discharge Condition: Stable/improved  Diet recommendation: Heart healthy diet, protein boost 3 times a day with meals  Filed Weights   10/16/13 1553 10/18/13 0400 10/19/13 0547  Weight: 45.36 kg (100 lb) 43.2 kg (95 lb 3.8 oz) 36 kg (79 lb 5.9 oz)    History of present illness:  79 year old female with a history of Parkinson's disease, hypertension, hyperlipidemia, and CAD presents with one-week history of worsening agitation, confusion, and hallucinations. A little over one week prior to admission, the patient's sleeping medicine was changed to Remeron. She was previously taking Restoril. After one dose, she had worsening confusion following day. Her primary care physician was contacted. The patient was instructed to continue her Remeron as an adequate trial to see if it was indeed causing the confusion and agitation. In addition, the patient's neurologist has also been trying to wean the patient's amantadine. She has been taking it every 48 hours rather than daily. For the past 3 days, there has been increasing hallucinations and paranoia. The patient is a poor historian. All of this history is obtained from speaking with the patient's husband at the bedside. There've been no reports of fevers, chills, chest discomfort, nausea, vomiting, diarrhea, abdominal pain. He reports that there has been some intermittent shortness of breath. At baseline, the patient has intermittent hallucinations and  confusion on a "good day". The only other new medication was some antibiotic in the past week used as prophylaxis for the patient's bladder botulinum toxin procedure.  In the ED, the patient received 500 cc normal saline. She was hemodynamically stable. Serum creatinine was noted to be 2.70. CBC was unremarkable. Urinalysis was negative for any pyuria. EKG shows sinus rhythm with left anterior fascicular block which has been unchanged. CT of brain was negative.  Hospital Course:  Patient is a pleasant 79 year old female with a past medical history of Parkinson's disease, who was admitted to the medicine service on 10/16/2013, presenting with confusion, agitation hallucinations in the week prior to presentation. Recent medication changes included starting Remeron at nighttime. Patient having a gradual functional decline, having decreased by mouth intake with worsening weakness. Acute encephalopathy was felt to be multifactorial with dehydration, acute renal failure, psychotropic medications and Parkinson medications all likely contributors. CT scan of the head without contrast performed on admission did not reveal acute intracranial abnormalities. Urinalysis was unremarkable as her white count was within normal limits. An obvious source of infection was not found. She improved significantly with IV fluid hydration and with the discontinuation of amantadine and Mirapex. Repeat labs showed improvement to her kidney function with creatinine coming down to 1.34 and BUN coming down to 45 by 10/20/2013. I discussed case with her neurologist Dr.Athar who recommended continuing carbidopa/levodopa and discontinuing of amantadine and Mirapex. Physical therapy was consulted as she was felt to be a candidate for rehabilitation at skilled nursing facility. Social work consulted, she was discharged to SNF on 10/21/2013 in stable condition.   Consultations: Dr.Athar of neurology over telephone conversation   Discharge  Exam: Filed  Vitals:   10/21/13 0552  BP: 137/71  Pulse: 67  Temp: 98.2 F (36.8 C)  Resp: 16    General: Patient appears much better, she is awake alert well-appearing Cardiovascular: Regular rate rhythm normal S1-S2 Respiratory: Clear to auscultation bilaterally Abdomen: Soft nontender nondistended   Discharge Instructions  Discharge Orders   Future Appointments Provider Department Dept Phone   01/03/2014 12:00 PM Huston Foley, MD [facility] [phone]   Future Orders Complete By Expires   Call MD for:  difficulty breathing, headache or visual disturbances  As directed    Call MD for:  extreme fatigue  As directed    Call MD for:  persistant dizziness or light-headedness  As directed    Call MD for:  persistant nausea and vomiting  As directed    Call MD for:  severe uncontrolled pain  As directed    Diet - low sodium heart healthy  As directed    Increase activity slowly  As directed        Medication List    STOP taking these medications       amantadine 100 MG capsule  Commonly known as:  SYMMETREL     Pramipexole Dihydrochloride 0.375 MG Tb24     temazepam 30 MG capsule  Commonly known as:  RESTORIL      TAKE these medications       amLODipine 10 MG tablet  Commonly known as:  NORVASC  Take 10 mg by mouth every evening.     aspirin 81 MG tablet  Take 81 mg by mouth every morning.     Biotin 5000 MCG Caps  Take 1 capsule by mouth 2 (two) times daily.     carbidopa-levodopa 25-100 MG per tablet  Commonly known as:  SINEMET IR  Take 1 tablet by mouth 3 (three) times daily.     Cholecalciferol 2000 UNITS Tabs  Take 1 tablet by mouth every morning.     famotidine 20 MG tablet  Commonly known as:  PEPCID  Take 20 mg by mouth 2 (two) times daily.     feeding supplement (ENSURE COMPLETE) Liqd  Take 237 mLs by mouth as needed (patient preference).     Fish Oil 1000 MG Caps  Take 1,000 mg by mouth every evening.     furosemide 40  MG tablet  Commonly known as:  LASIX  Take 0.5 tablets (20 mg total) by mouth daily.     LORazepam 0.5 MG tablet  Commonly known as:  ATIVAN  Take 1 tablet (0.5 mg total) by mouth every 8 (eight) hours as needed for sleep.     mupirocin ointment 2 %  Commonly known as:  BACTROBAN  Place 1 application into the nose 2 (two) times daily.     OCUVITE PO  Take 1 tablet by mouth every evening.     pantoprazole 40 MG tablet  Commonly known as:  PROTONIX  Take 40 mg by mouth 2 (two) times daily.     triamcinolone cream 0.1 %  Commonly known as:  KENALOG  Apply 1 application topically 2 (two) times daily as needed (rash).       Allergies  Allergen Reactions  . Mirtazapine Other (See Comments)    confusion  . Codeine Hives  . Latex Rash    Use cotton dressings       Follow-up Information   Follow up with Huston Foley, MD In 1 week.   Specialties:  Neurology, Radiology  Contact information:   [address] [place] [address] [phone]       Follow up with Allean Found, MD In 2 weeks.   Specialty:  Family Medicine   Contact information:   [address] [place] [address] [phone]        The results of significant diagnostics from this hospitalization (including imaging, microbiology, ancillary and laboratory) are listed below for reference.    Significant Diagnostic Studies: Ct Head Wo Contrast  10/16/2013   CLINICAL DATA:  Altered mental status  EXAM: CT HEAD WITHOUT CONTRAST  TECHNIQUE: Contiguous axial images were obtained from the base of the skull through the vertex without intravenous contrast.  COMPARISON:  09/05/2013  FINDINGS: There is diffuse cortical atrophy. Cerebellar atrophy is also appreciated. Diffuse areas of low attenuation project within the subcortical, deep, and periventricular white matter regions. There is no evidence of mass effect, intra-axial or extra-axial fluid collections, nor acute  hemorrhage. The ventricles and cisterns are patent. There is no evidence of a depressed skull fracture. Very mild air-fluid level projects in the base of the left maxillary sinus. The ethmoid air cells and remaining visualized paranasal sinuses are patent.  IMPRESSION: Chronic and involutional changes without evidence of acute abnormalities.   Electronically Signed   By: Salome Holmes M.D.   On: 10/16/2013 10:36   Dg Chest Port 1 View  10/16/2013   CLINICAL DATA:  Cough and shortness of breath  EXAM: PORTABLE CHEST - 1 VIEW  COMPARISON:  09/05/2013  FINDINGS: Cardiac shadow is enlarged but stable. The overall inspiratory effort is poor. Vascular crowding is noted although there is likely a mild degree of vascular congestion present. No acute bony abnormality is seen. Old left rib fractures are noted  IMPRESSION: Poor inspiratory effort.  Likely mild vascular congestion.   Electronically Signed   By: Alcide Clever M.D.   On: 10/16/2013 15:54    Microbiology: Recent Results (from the past 240 hour(s))  MRSA PCR SCREENING     Status: Abnormal   Collection Time    10/17/13  8:21 PM      Result Value Range Status   MRSA by PCR POSITIVE (*) NEGATIVE Final   Comment:            The GeneXpert MRSA Assay (FDA     approved for NASAL specimens     only), is one component of a     comprehensive MRSA colonization     surveillance program. It is not     intended to diagnose MRSA     infection nor to guide or     monitor treatment for     MRSA infections.     RESULT CALLED TO, READ BACK BY AND VERIFIED WITH:     MBEMENA,Q/2W @2305  ON 10/17/13 BY KARCZEWSKI,S.     Labs: Basic Metabolic Panel:  Recent Labs Lab 10/16/13 1008 10/17/13 0534 10/18/13 0400 10/19/13 0330 10/20/13 0514  NA 140 144 148* 139 134*  K 4.3 3.8 3.9 4.0 4.0  CL 102 109 115* 110 102  CO2 23 20 20 21 21   GLUCOSE 88 74 51* 124* 102*  BUN 60* 44* 35* 33* 25*  CREATININE 2.70* 2.26* 1.85* 1.62* 1.34*  CALCIUM 9.8 9.0 9.2  8.9 8.9   Liver Function Tests: No results found for this basename: AST, ALT, ALKPHOS, BILITOT, PROT, ALBUMIN,  in the last 168 hours No results found for this basename: LIPASE, AMYLASE,  in the  last 168 hours  Recent Labs Lab 10/16/13 1627  AMMONIA 28   CBC:  Recent Labs Lab 10/16/13 1008 10/18/13 0400 10/19/13 0330 10/20/13 0514  WBC 6.2 6.5 6.8 7.3  NEUTROABS 4.2  --   --   --   HGB 11.0* 10.0* 9.6* 11.2*  HCT 32.5* 30.2* 28.5* 33.6*  MCV 85.5 88.3 86.9 87.3  PLT 325 295 248 271   Cardiac Enzymes: No results found for this basename: CKTOTAL, CKMB, CKMBINDEX, TROPONINI,  in the last 168 hours BNP: BNP (last 3 results)  Recent Labs  11/20/12 1548 09/05/13 0908  PROBNP 2104.0* 1649.0*   CBG:  Recent Labs Lab 10/18/13 0729 10/18/13 0810  GLUCAP 59* 95       Signed:  Jeralyn Bennett  [facility] 10/21/2013, 12:20 PM

## 2013-10-21 NOTE — Progress Notes (Signed)
Clinical Social Work Department CLINICAL SOCIAL WORK PLACEMENT NOTE 10/21/2013  Patient:  [name], [name]  Account Number:  [id number] Admit date:  10/16/2013  Clinical Social Worker:  Jacelyn Grip  Date/time:  10/19/2013 04:30 PM  Clinical Social Work is seeking post-discharge placement for this patient at the following level of care:   SKILLED NURSING   (*CSW will update this form in Epic as items are completed)   10/19/2013  Patient/family provided with [facility] Department of Clinical Social Work's list of facilities offering this level of care within the geographic area requested by the patient (or if unable, by the patient's family).  10/19/2013  Patient/family informed of their freedom to choose among providers that offer the needed level of care, that participate in Medicare, Medicaid or managed care program needed by the patient, have an available bed and are willing to accept the patient.  10/19/2013  Patient/family informed of [facility]' ownership interest in [facility], as well as of the fact that they are under no obligation to receive care at this facility.  PASARR submitted to [employer]DS on 10/19/2013 PASARR number received from [employer] on 10/19/2013  FL2 transmitted to all facilities in geographic area requested by pt/family on  10/19/2013 FL2 transmitted to all facilities within larger geographic area on   Patient informed that his/her managed care company has contracts with or will negotiate with  certain facilities, including the following:     Patient/family informed of bed offers received:  10/20/2013 Patient chooses bed at [facility] Physician recommends and patient chooses bed at    Patient to be transferred to [facility] on  10/21/2013 Patient to be transferred to facility by [facility]  The following physician request were entered in Epic:   Additional Comments:   Cori Razor LCSW [phone]

## 2013-10-21 NOTE — Progress Notes (Signed)
Pt to discharge to [facility].  Report called to [facility] and given to the director there.  Pt's husband is to transport her to [facility].  Discharge packet given pt's husband.  No concerns at discharge.

## 2013-10-23 ENCOUNTER — Non-Acute Institutional Stay (SKILLED_NURSING_FACILITY): Payer: Medicare Other | Admitting: Adult Health

## 2013-10-23 DIAGNOSIS — R5381 Other malaise: Secondary | ICD-10-CM

## 2013-10-23 DIAGNOSIS — K219 Gastro-esophageal reflux disease without esophagitis: Secondary | ICD-10-CM

## 2013-10-23 DIAGNOSIS — G20 Parkinson's disease: Secondary | ICD-10-CM

## 2013-10-23 DIAGNOSIS — I10 Essential (primary) hypertension: Secondary | ICD-10-CM

## 2013-10-23 DIAGNOSIS — I509 Heart failure, unspecified: Secondary | ICD-10-CM

## 2013-10-23 DIAGNOSIS — R531 Weakness: Secondary | ICD-10-CM

## 2013-10-24 ENCOUNTER — Encounter: Payer: Self-pay | Admitting: Neurology

## 2013-10-24 ENCOUNTER — Telehealth: Payer: Self-pay | Admitting: Neurology

## 2013-10-24 ENCOUNTER — Ambulatory Visit (INDEPENDENT_AMBULATORY_CARE_PROVIDER_SITE_OTHER): Payer: Medicare Other | Admitting: Neurology

## 2013-10-24 VITALS — BP 160/84 | HR 59 | Temp 97.6°F

## 2013-10-24 DIAGNOSIS — R413 Other amnesia: Secondary | ICD-10-CM

## 2013-10-24 DIAGNOSIS — G20A1 Parkinson's disease without dyskinesia, without mention of fluctuations: Secondary | ICD-10-CM

## 2013-10-24 DIAGNOSIS — W19XXXD Unspecified fall, subsequent encounter: Secondary | ICD-10-CM

## 2013-10-24 DIAGNOSIS — F22 Delusional disorders: Secondary | ICD-10-CM

## 2013-10-24 DIAGNOSIS — G20 Parkinson's disease: Secondary | ICD-10-CM

## 2013-10-24 DIAGNOSIS — R441 Visual hallucinations: Secondary | ICD-10-CM

## 2013-10-24 DIAGNOSIS — R269 Unspecified abnormalities of gait and mobility: Secondary | ICD-10-CM

## 2013-10-24 DIAGNOSIS — H5316 Psychophysical visual disturbances: Secondary | ICD-10-CM

## 2013-10-24 DIAGNOSIS — G9340 Encephalopathy, unspecified: Secondary | ICD-10-CM

## 2013-10-24 DIAGNOSIS — W19XXXA Unspecified fall, initial encounter: Secondary | ICD-10-CM

## 2013-10-24 NOTE — Patient Instructions (Signed)
Drink more fluid frequently in smaller sips.  I will see you back in March as previously scheduled.

## 2013-10-24 NOTE — Telephone Encounter (Signed)
See telephone note from 10/24/13

## 2013-10-24 NOTE — Telephone Encounter (Signed)
Patient sched/ confirmed for 10/24/13, ok'd per Brett Canales

## 2013-10-24 NOTE — Progress Notes (Signed)
Subjective:    Patient ID: [name] is a 79 y.o. female.  HPI    Interim history:   [name] is a very pleasant 79 year old right-handed woman who presents for followup consultation of her Parkinson's disease, complicated by recurrent falls, depression, and memory loss and orthostatic hypotension she is accompanied by her husband today. She presents for a sooner than scheduled appointment due a recent hospitalization because of altered mental status. I also talked with her admitting hospitalist a few days ago. She was admitted with one-week history of worsening agitation, confusion and hallucinations. Her sleep medicine was changed to Remeron recently. She was weaned off of amantadine and Mirapex. She had no fevers or chills. She was dehydrated and received IV fluids. They felt her acute encephalopathy was multifactorial in nature with dehydration, acute renal failure, medication changes and Parkinson's medications. I had recently seen her on 10/11/2013 after a recent fall. At that visit I suggested that she use her walker at all times and also her wheelchair. I asked her to taper off amantadine at the time. I suggested evaluation for motorized wheelchair. I continued her on Mirapex. From the hospital she was discharged to a skilled nursing facility on 10/21/2013, [facility], were she continues to stay at this time.  Today, she reports doing better, she is in a WC. She is back on Lasix 20 mg for swelling and Ativan 0.5 mg, which helps her sleep, per husband. She had a CTH wo contrast on 10/16/13: Chronic and involutional changes without evidence of acute abnormalities.  I personally reviewed the images through the PACS system. She had a creatinine on 2.7 and BUN of 60 upon admission, improved on the 18th: 1.34 and 25, respectively. Her husband reports that she would prefer to be at [facility] over being at [facility]. She continues to have hallucinations, and some are illusions. She sees dirt, and  bugs.   I saw her on 08/25/2013, and which time I suggested the same dose of Sinemet. I felt she had a multifactorial gait disorder, which is a function of advancing age, severe kyphoscoliosis, weakness, CHF, and advanced PD with freezing and OH. In the interim, she fell on 09/05/2013 and this resulted in a hip hematoma but no new fractures. She had imaging testing done. She had a hospital stay from 09/05/2013 through 09/07/2013. She was advised to reduce sedating medications. After her last visit I changed her from Mirapex long-acting to immediate release Mirapex for cost. The dose remained the same. I also talked to them about getting additional help for day-to-day chores at the house.   She has fallen repeatedly in the past. She falls backwards and forward, with and without the walker. She had HH PT/OT/ST for about 6 weeks through [facility] and was told to not transfer or walk without the walker. She sometimes forgets to use the walker. She has become more weak in general. She has had more difficulty feeding herself.  I first met them on 01/20/2013 and she previously followed with Dr. Avie Echevaria for about 12 years. At the time of our first visit I suggested stopping the selegiline as she did not notice much in the way of improvement in her freezing after restarting it. I increased her Sinemet to one whole pill twice daily and continued her pramipexole and amantadine. She has an underlying medical history of spinal stenosis, severe kyphoscoliosis, heart disease with Hx of MI, arthritis, bladder cancer, hypertension, hyperlipidemia, diabetes, peptic ulcer disease, adrenal insufficiency, anxiety,  fibromyalgia, recurrent UTIs and recurrent falls. She is status post tonsillectomy, appendectomy and abdominal hysterectomy.  She has had more freezing, some intermittent confusion, more fatigue, more daytime sleepiness, per husband. After taking the selegiline off, she may have had more freezing. She has had  recurrent falls. She broke her L wrist and had a cast for 6 weeks. She has been falling repeatedly. She hurt her ribs.  Her Past Medical History Is Significant For: Past Medical History  Diagnosis Date  . MI, old   . Parkinson disease   . Scoliosis   . Arthritis   . Bladder cancer   . HTN (hypertension) 04/21/2012  . Hyperlipidemia 04/21/2012  . DM (diabetes mellitus) 04/21/2012  . CAD (coronary artery disease) 04/21/2012  . Barrett esophagus 04/21/2012  . PUD (peptic ulcer disease) 04/21/2012  . Adrenal insufficiency 04/21/2012  . Raynaud's disease 04/21/2012  . Anxiety 04/21/2012  . Fibromyalgia 04/21/2012  . Hx of bladder cancer 04/21/2012  . Edema of lower extremity 04/21/2012    Chronic lower extremity edema  . Falls 04/21/2012  . Recurrent UTI 01/20/2013    Her Past Surgical History Is Significant For: Past Surgical History  Procedure Laterality Date  . Tonsillectomy    . Appendectomy    . Abdominal hysterectomy      Her Family History Is Significant For: Family History  Problem Relation Age of Onset  . Heart disease Mother   . Diabetes Mother   . Bladder Cancer Maternal Aunt     Her Social History Is Significant For: History   Social History  . Marital Status: Married    Spouse Name: N/A    Number of Children: N/A  . Years of Education: N/A   Social History Main Topics  . Smoking status: Former Smoker    Types: Cigarettes  . Smokeless tobacco: Never Used  . Alcohol Use: No  . Drug Use: No  . Sexual Activity: No   Other Topics Concern  . None   Social History Narrative  . None    Her Allergies Are:  Allergies  Allergen Reactions  . Mirtazapine Other (See Comments)    confusion  . Codeine Hives  . Latex Rash    Use cotton dressings  :   Her Current Medications Are:  Outpatient Encounter Prescriptions as of 10/24/2013  Medication Sig  . amLODipine (NORVASC) 10 MG tablet Take 10 mg by mouth every evening.  Marland Kitchen aspirin 81 MG tablet Take 81 mg by  mouth every morning.   . Biotin 5000 MCG CAPS Take 1 capsule by mouth 2 (two) times daily.  . carbidopa-levodopa (SINEMET IR) 25-100 MG per tablet Take 1 tablet by mouth 3 (three) times daily.  . Cholecalciferol 2000 UNITS TABS Take 1 tablet by mouth every morning.  . feeding supplement (ENSURE COMPLETE) LIQD Take 237 mLs by mouth as needed (patient preference).   . furosemide (LASIX) 40 MG tablet Take 0.5 tablets (20 mg total) by mouth daily.  Marland Kitchen LORazepam (ATIVAN) 0.5 MG tablet Take 1 tablet (0.5 mg total) by mouth every 8 (eight) hours as needed for sleep.  . Multiple Vitamins-Minerals (OCUVITE PO) Take 1 tablet by mouth every evening.  . Omega-3 Fatty Acids (FISH OIL) 1000 MG CAPS Take 1,000 mg by mouth every evening.  . pantoprazole (PROTONIX) 40 MG tablet Take 40 mg by mouth 2 (two) times daily.  Marland Kitchen triamcinolone cream (KENALOG) 0.1 % Apply 1 application topically 2 (two) times daily as needed (rash).   . famotidine (  PEPCID) 20 MG tablet Take 20 mg by mouth 2 (two) times daily.  . mupirocin ointment (BACTROBAN) 2 % Place 1 application into the nose 2 (two) times daily.  . [DISCONTINUED] amantadine (SYMMETREL) 100 MG capsule   :  Review of Systems:  Out of a complete 14 point review of systems, all are reviewed and negative with the exception of these symptoms as listed below:   Review of Systems  Constitutional: Negative.   HENT: Negative.   Eyes: Negative.   Respiratory: Negative.   Cardiovascular: Negative.   Gastrointestinal: Negative.   Endocrine: Negative.   Genitourinary: Negative.   Musculoskeletal: Negative.   Skin: Negative.   Allergic/Immunologic: Negative.   Neurological: Negative.   Hematological: Negative.   Psychiatric/Behavioral: Negative.   All other systems reviewed and are negative.    Objective:  Neurologic Exam  Physical Exam Physical Examination:   Filed Vitals:   10/24/13 1211  BP: 160/84  Pulse: 59  Temp: 97.6 F (36.4 C)    General  Examination: The patient is a very pleasant 79 y.o. female in no acute distress. She is situated in her WC. She is frail appearing and seems to be sleepy. She has having trouble relating her symptoms.    HEENT: Normocephalic, atraumatic, pupils are equal, round and reactive to light and accommodation. Extraocular tracking is good without nystagmus noted. She has a bruise around her left wrist. Gaze is conjugate. Normal smooth pursuit is noted. Hearing is grossly intact. Face is symmetric with mild facial masking noted. Speech is clear with no dysarthria noted. Voice is slightly soft. There is no lip, neck or jaw tremor. Neck is mildly rigid. There are no carotid bruits on auscultation. Oropharynx exam reveals moderate mouth dryness. No significant airway crowding is noted. Mallampati is class II. Tongue protrudes centrally and palate elevates symmetrically.  Chest: is clear to auscultation without wheezing, rhonchi or crackles noted.  Heart: sounds are normal with a 2/6 systolic murmur, no rubs or gallops noted.  Abdomen: is soft, non-tender and non-distended with normal bowel sounds appreciated on auscultation.  Extremities: There is 1+ pitting edema in the distal lower extremities bilaterally.  Skin: is warm and dry with no trophic changes noted.  Musculoskeletal: exam reveals significant kyphoscoliosis, uneven shoulder height. She is leaning to the L.  Neurologically: Mental status: The patient is awake, alert and oriented to self and doctor's office. Her Memory, attention, language and knowledge are impaired. There is no aphasia, agnosia, apraxia or anomia. There is moderate bradyphrenia.  Cranial nerves are as described above under HEENT exam.  Motor exam: thin bulk and global strength of 4/5. Reflexes are 1+ throughout. Fine motor skills are moderately impaired with finger taps and hand movements b/l, L worse than R. She has moderate to severe impairment of foot taps b/l, L worse than R. She  has a slight postural and action tremor on the right only. Sensory exam is intact to light touch. I did not ask her to stand or walk for me today.      Assessment and Plan:   In summary, CAMYAH PULTZ is a very pleasant 79 year old female with a history of advanced Parkinson's disease, left-sided predominant, complicated by severe posture changes, gait d/o, recurrent falls, memory loss, and in hallucinations and delusions and recent admission to the hospital with acute encephalopathy in the context of dehydration with acute kidney failure, recent medication side effects and parkinson's disease with likely medication side effects. She has been taken off  of amantadine, Mirapex and Remeron. She was started on Ativan which has been helpful for sleep. She is currently in skilled nursing for rehabilitation. I suggested that she continue with rehabilitation at [facility]. At this juncture, I will not make any medication changes and keep her on Sinemet 3 times a day. I explained to her and her husband that this is not the best time to increase any of her Sinemet because she is still recovering from her recent setback and her altered mental status albeit better is still going to have to improve with time. She is encouraged to drink more water. She is encouraged to continue with therapy at [facility]. I will see her back in March as previously scheduled.  Most of my 25 minute visit today was spent in counseling and coordination of care, reviewing test results and reviewing medication.

## 2013-10-26 ENCOUNTER — Other Ambulatory Visit: Payer: Self-pay

## 2013-10-26 LAB — BASIC METABOLIC PANEL
Anion Gap: 5 — ABNORMAL LOW (ref 7–16)
Calcium, Total: 9.4 mg/dL (ref 8.5–10.1)
Chloride: 106 mmol/L (ref 98–107)
Co2: 27 mmol/L (ref 21–32)
Creatinine: 1.36 mg/dL — ABNORMAL HIGH (ref 0.60–1.30)
EGFR (African American): 42 — ABNORMAL LOW
EGFR (Non-African Amer.): 36 — ABNORMAL LOW
Glucose: 168 mg/dL — ABNORMAL HIGH (ref 65–99)
Potassium: 4.8 mmol/L (ref 3.5–5.1)

## 2013-11-09 ENCOUNTER — Non-Acute Institutional Stay (SKILLED_NURSING_FACILITY): Payer: Medicare PPO | Admitting: Internal Medicine

## 2013-11-09 DIAGNOSIS — I10 Essential (primary) hypertension: Secondary | ICD-10-CM

## 2013-11-09 DIAGNOSIS — I2510 Atherosclerotic heart disease of native coronary artery without angina pectoris: Secondary | ICD-10-CM

## 2013-11-09 DIAGNOSIS — G20 Parkinson's disease: Secondary | ICD-10-CM

## 2013-11-09 DIAGNOSIS — I509 Heart failure, unspecified: Secondary | ICD-10-CM | POA: Insufficient documentation

## 2013-11-09 NOTE — Progress Notes (Signed)
        PROGRESS NOTE  DATE: 11/09/2013   FACILITY: [facility]  LEVEL OF CARE: SNF (31)  Discharge Visit  CHIEF COMPLAINT:  Manage CHF and Parkinson's disease  HISTORY OF PRESENT ILLNESS: I was requested by the social worker to perform face-to-face evaluation for discharge:  Patient was admitted to this facility for short-term rehabilitation after the patient's recent hospitalization.  Patient has completed SNF rehabilitation and therapy has cleared the patient for discharge on 11-10-13  Reassessment of ongoing problem(s):  CHF:The patient does not relate significant weight changes, denies sob, DOE, orthopnea, PNDs, pedal edema, palpitations or chest pain.  CHF remains stable.  No complications form the medications being used.  PARKINSON'S DISEASE: pt's Parkinson's disease is stable.  Denies progression of sx recently.  Pt is tolerating Parkinson's disease medications without any complications.  PAST MEDICAL HISTORY : Reviewed.  No changes.  CURRENT MEDICATIONS: Reviewed per [facility]  REVIEW OF SYSTEMS:  GENERAL: no change in appetite, no fatigue, no weight changes, no fever, chills or weakness RESPIRATORY: no cough, SOB, DOE, wheezing, hemoptysis CARDIAC: no chest pain, edema or palpitations GI: no abdominal pain, diarrhea, constipation, heart burn, nausea or vomiting  PHYSICAL EXAMINATION  GENERAL: no acute distress, normal body habitus EYES: conjunctivae normal, sclerae normal, normal eye lids NECK: supple, trachea midline, no neck masses, no thyroid tenderness, no thyromegaly LYMPHATICS: no LAN in the neck, no supraclavicular LAN RESPIRATORY: breathing is even & unlabored, BS CTAB CARDIAC: RRR, no murmur,no extra heart sounds, no edema GI: abdomen soft, normal BS, no masses, no tenderness, no hepatomegaly, no splenomegaly PSYCHIATRIC: the patient is alert & oriented to person, affect & behavior appropriate  LABS/RADIOLOGY:  None  ASSESSMENT/PLAN:  CHF-well compensated Parkinson's disease-continue Sinemet Hypertension-continue antihypertensives CAD-stable GERD-stable CKD stage IV-no acute issues  I have filled out patient's discharge paperwork and written prescriptions.   DME provided: Wheelchair and hospital bed  Total discharge time: Greater than 30 minutes Discharge time involved coordination of the discharge process with Education officer, museum, nursing staff and therapy department. Medical justification for home health services/DME verified.  CPT CODE: 38887

## 2013-11-17 ENCOUNTER — Inpatient Hospital Stay (HOSPITAL_COMMUNITY)
Admission: EM | Admit: 2013-11-17 | Discharge: 2013-11-21 | DRG: 070 | Disposition: A | Discharge status: Hospice | Attending: Internal Medicine | Admitting: Internal Medicine

## 2013-11-17 ENCOUNTER — Encounter (HOSPITAL_COMMUNITY): Payer: Self-pay | Admitting: Emergency Medicine

## 2013-11-17 DIAGNOSIS — R269 Unspecified abnormalities of gait and mobility: Secondary | ICD-10-CM

## 2013-11-17 DIAGNOSIS — K219 Gastro-esophageal reflux disease without esophagitis: Secondary | ICD-10-CM

## 2013-11-17 DIAGNOSIS — E43 Unspecified severe protein-calorie malnutrition: Secondary | ICD-10-CM | POA: Diagnosis present

## 2013-11-17 DIAGNOSIS — W19XXXA Unspecified fall, initial encounter: Secondary | ICD-10-CM

## 2013-11-17 DIAGNOSIS — Z8551 Personal history of malignant neoplasm of bladder: Secondary | ICD-10-CM

## 2013-11-17 DIAGNOSIS — E860 Dehydration: Secondary | ICD-10-CM | POA: Diagnosis present

## 2013-11-17 DIAGNOSIS — R4182 Altered mental status, unspecified: Secondary | ICD-10-CM

## 2013-11-17 DIAGNOSIS — G20A1 Parkinson's disease without dyskinesia, without mention of fluctuations: Secondary | ICD-10-CM | POA: Diagnosis present

## 2013-11-17 DIAGNOSIS — N184 Chronic kidney disease, stage 4 (severe): Secondary | ICD-10-CM | POA: Diagnosis present

## 2013-11-17 DIAGNOSIS — I129 Hypertensive chronic kidney disease with stage 1 through stage 4 chronic kidney disease, or unspecified chronic kidney disease: Secondary | ICD-10-CM | POA: Diagnosis present

## 2013-11-17 DIAGNOSIS — Z7982 Long term (current) use of aspirin: Secondary | ICD-10-CM

## 2013-11-17 DIAGNOSIS — I252 Old myocardial infarction: Secondary | ICD-10-CM

## 2013-11-17 DIAGNOSIS — T424X5A Adverse effect of benzodiazepines, initial encounter: Secondary | ICD-10-CM | POA: Diagnosis present

## 2013-11-17 DIAGNOSIS — Z87891 Personal history of nicotine dependence: Secondary | ICD-10-CM

## 2013-11-17 DIAGNOSIS — I2510 Atherosclerotic heart disease of native coronary artery without angina pectoris: Secondary | ICD-10-CM | POA: Diagnosis present

## 2013-11-17 DIAGNOSIS — Z515 Encounter for palliative care: Secondary | ICD-10-CM

## 2013-11-17 DIAGNOSIS — M6281 Muscle weakness (generalized): Secondary | ICD-10-CM | POA: Diagnosis present

## 2013-11-17 DIAGNOSIS — F419 Anxiety disorder, unspecified: Secondary | ICD-10-CM

## 2013-11-17 DIAGNOSIS — R252 Cramp and spasm: Secondary | ICD-10-CM | POA: Diagnosis present

## 2013-11-17 DIAGNOSIS — E119 Type 2 diabetes mellitus without complications: Secondary | ICD-10-CM | POA: Diagnosis present

## 2013-11-17 DIAGNOSIS — T481X5A Adverse effect of skeletal muscle relaxants [neuromuscular blocking agents], initial encounter: Secondary | ICD-10-CM | POA: Diagnosis present

## 2013-11-17 DIAGNOSIS — G9340 Encephalopathy, unspecified: Principal | ICD-10-CM | POA: Diagnosis present

## 2013-11-17 DIAGNOSIS — G20 Parkinson's disease: Secondary | ICD-10-CM | POA: Diagnosis present

## 2013-11-17 DIAGNOSIS — Z66 Do not resuscitate: Secondary | ICD-10-CM | POA: Diagnosis present

## 2013-11-17 DIAGNOSIS — IMO0002 Reserved for concepts with insufficient information to code with codable children: Secondary | ICD-10-CM

## 2013-11-17 DIAGNOSIS — I10 Essential (primary) hypertension: Secondary | ICD-10-CM | POA: Diagnosis present

## 2013-11-17 DIAGNOSIS — E785 Hyperlipidemia, unspecified: Secondary | ICD-10-CM | POA: Diagnosis present

## 2013-11-17 DIAGNOSIS — R531 Weakness: Secondary | ICD-10-CM | POA: Diagnosis present

## 2013-11-17 HISTORY — DX: Encephalopathy, unspecified: G93.40

## 2013-11-17 LAB — CBC WITH DIFFERENTIAL/PLATELET
BASOS ABS: 0 10*3/uL (ref 0.0–0.1)
Basophils Relative: 0 % (ref 0–1)
Eosinophils Absolute: 0.1 10*3/uL (ref 0.0–0.7)
Eosinophils Relative: 1 % (ref 0–5)
HCT: 35.8 % — ABNORMAL LOW (ref 36.0–46.0)
Hemoglobin: 11.7 g/dL — ABNORMAL LOW (ref 12.0–15.0)
LYMPHS ABS: 1.5 10*3/uL (ref 0.7–4.0)
LYMPHS PCT: 20 % (ref 12–46)
MCH: 28.7 pg (ref 26.0–34.0)
MCHC: 32.7 g/dL (ref 30.0–36.0)
MCV: 88 fL (ref 78.0–100.0)
Monocytes Absolute: 0.6 10*3/uL (ref 0.1–1.0)
Monocytes Relative: 9 % (ref 3–12)
NEUTROS ABS: 5.3 10*3/uL (ref 1.7–7.7)
NEUTROS PCT: 70 % (ref 43–77)
PLATELETS: 328 10*3/uL (ref 150–400)
RBC: 4.07 MIL/uL (ref 3.87–5.11)
RDW: 15.9 % — AB (ref 11.5–15.5)
WBC: 7.5 10*3/uL (ref 4.0–10.5)

## 2013-11-17 LAB — GLUCOSE, CAPILLARY: GLUCOSE-CAPILLARY: 89 mg/dL (ref 70–99)

## 2013-11-17 LAB — CG4 I-STAT (LACTIC ACID): LACTIC ACID, VENOUS: 0.7 mmol/L (ref 0.5–2.2)

## 2013-11-17 NOTE — ED Notes (Addendum)
Patient arrives via EMS from home, initial call was inability to get patient off of toilet. Currently patient is in hospice care with Parkinsons and has a DNR. Recently started Baclofen for severe back spasms. Patient took first dose of Baclofen on 11/16/13 and has been non-responsive since 2030 that evening.

## 2013-11-18 ENCOUNTER — Encounter (HOSPITAL_COMMUNITY): Payer: Self-pay | Admitting: Radiology

## 2013-11-18 ENCOUNTER — Emergency Department (HOSPITAL_COMMUNITY)

## 2013-11-18 DIAGNOSIS — K219 Gastro-esophageal reflux disease without esophagitis: Secondary | ICD-10-CM

## 2013-11-18 DIAGNOSIS — R4182 Altered mental status, unspecified: Secondary | ICD-10-CM

## 2013-11-18 DIAGNOSIS — I10 Essential (primary) hypertension: Secondary | ICD-10-CM

## 2013-11-18 DIAGNOSIS — R531 Weakness: Secondary | ICD-10-CM | POA: Insufficient documentation

## 2013-11-18 DIAGNOSIS — G9340 Encephalopathy, unspecified: Secondary | ICD-10-CM

## 2013-11-18 DIAGNOSIS — M79609 Pain in unspecified limb: Secondary | ICD-10-CM

## 2013-11-18 DIAGNOSIS — G20 Parkinson's disease: Secondary | ICD-10-CM

## 2013-11-18 HISTORY — DX: Encephalopathy, unspecified: G93.40

## 2013-11-18 LAB — URINALYSIS, ROUTINE W REFLEX MICROSCOPIC
Bilirubin Urine: NEGATIVE
GLUCOSE, UA: NEGATIVE mg/dL
HGB URINE DIPSTICK: NEGATIVE
Ketones, ur: NEGATIVE mg/dL
Nitrite: NEGATIVE
PROTEIN: NEGATIVE mg/dL
SPECIFIC GRAVITY, URINE: 1.014 (ref 1.005–1.030)
UROBILINOGEN UA: 0.2 mg/dL (ref 0.0–1.0)
pH: 6.5 (ref 5.0–8.0)

## 2013-11-18 LAB — HEPATIC FUNCTION PANEL
ALT: 19 U/L (ref 0–35)
AST: 21 U/L (ref 0–37)
Albumin: 3 g/dL — ABNORMAL LOW (ref 3.5–5.2)
Alkaline Phosphatase: 87 U/L (ref 39–117)
BILIRUBIN DIRECT: 0.2 mg/dL (ref 0.0–0.3)
Indirect Bilirubin: 0.1 mg/dL — ABNORMAL LOW (ref 0.3–0.9)
TOTAL PROTEIN: 6.1 g/dL (ref 6.0–8.3)
Total Bilirubin: 0.3 mg/dL (ref 0.3–1.2)

## 2013-11-18 LAB — BASIC METABOLIC PANEL
BUN: 33 mg/dL — AB (ref 6–23)
BUN: 36 mg/dL — ABNORMAL HIGH (ref 6–23)
CALCIUM: 9.3 mg/dL (ref 8.4–10.5)
CALCIUM: 9.8 mg/dL (ref 8.4–10.5)
CO2: 23 mEq/L (ref 19–32)
CO2: 24 mEq/L (ref 19–32)
Chloride: 105 mEq/L (ref 96–112)
Chloride: 108 mEq/L (ref 96–112)
Creatinine, Ser: 1.08 mg/dL (ref 0.50–1.10)
Creatinine, Ser: 1.14 mg/dL — ABNORMAL HIGH (ref 0.50–1.10)
GFR calc Af Amer: 50 mL/min — ABNORMAL LOW (ref >90)
GFR calc non Af Amer: 43 mL/min — ABNORMAL LOW (ref >90)
GFR calc non Af Amer: 46 mL/min — ABNORMAL LOW (ref >90)
GFR, EST AFRICAN AMERICAN: 53 mL/min — AB (ref >90)
Glucose, Bld: 81 mg/dL (ref 70–99)
Glucose, Bld: 90 mg/dL (ref 70–99)
Potassium: 4.7 mEq/L (ref 3.7–5.3)
Potassium: 4.7 mEq/L (ref 3.7–5.3)
SODIUM: 144 meq/L (ref 137–147)
Sodium: 145 mEq/L (ref 137–147)

## 2013-11-18 LAB — URINE MICROSCOPIC-ADD ON

## 2013-11-18 LAB — MRSA PCR SCREENING: MRSA BY PCR: POSITIVE — AB

## 2013-11-18 MED ORDER — ACETAMINOPHEN 650 MG RE SUPP
650.0000 mg | Freq: Four times a day (QID) | RECTAL | Status: DC | PRN
Start: 2013-11-18 — End: 2013-11-21

## 2013-11-18 MED ORDER — AMLODIPINE BESYLATE 10 MG PO TABS
10.0000 mg | ORAL_TABLET | Freq: Every day | ORAL | Status: DC
  Administered 2013-11-19 – 2013-11-21 (×3): 10 mg via ORAL
  Filled 2013-11-18 (×4): qty 1

## 2013-11-18 MED ORDER — PANTOPRAZOLE SODIUM 40 MG IV SOLR
40.0000 mg | Freq: Two times a day (BID) | INTRAVENOUS | Status: DC
  Administered 2013-11-18 – 2013-11-20 (×6): 40 mg via INTRAVENOUS
  Filled 2013-11-18 (×8): qty 40

## 2013-11-18 MED ORDER — CHLORHEXIDINE GLUCONATE CLOTH 2 % EX PADS
6.0000 | MEDICATED_PAD | Freq: Every day | CUTANEOUS | Status: DC
  Administered 2013-11-19 – 2013-11-21 (×3): 6 via TOPICAL

## 2013-11-18 MED ORDER — ACETAMINOPHEN 325 MG PO TABS
650.0000 mg | ORAL_TABLET | Freq: Four times a day (QID) | ORAL | Status: DC | PRN
  Administered 2013-11-20: 650 mg via ORAL
  Filled 2013-11-18: qty 2

## 2013-11-18 MED ORDER — ASPIRIN 81 MG PO CHEW
81.0000 mg | CHEWABLE_TABLET | Freq: Every morning | ORAL | Status: DC
  Administered 2013-11-19 – 2013-11-21 (×3): 81 mg via ORAL
  Filled 2013-11-18 (×4): qty 1

## 2013-11-18 MED ORDER — DOCUSATE SODIUM 100 MG PO CAPS
100.0000 mg | ORAL_CAPSULE | Freq: Two times a day (BID) | ORAL | Status: DC
  Administered 2013-11-19 – 2013-11-21 (×4): 100 mg via ORAL
  Filled 2013-11-18 (×8): qty 1

## 2013-11-18 MED ORDER — MUPIROCIN 2 % EX OINT
1.0000 | TOPICAL_OINTMENT | Freq: Two times a day (BID) | CUTANEOUS | Status: DC
Start: 2013-11-19 — End: 2013-11-21
  Administered 2013-11-19 – 2013-11-21 (×6): 1 via NASAL
  Filled 2013-11-18: qty 22

## 2013-11-18 MED ORDER — HYDRALAZINE HCL 20 MG/ML IJ SOLN: 5.0000 mg | Freq: Four times a day (QID) | INTRAMUSCULAR | Status: DC | PRN

## 2013-11-18 MED ORDER — ONDANSETRON HCL 4 MG PO TABS: 4.0000 mg | ORAL_TABLET | Freq: Four times a day (QID) | ORAL | Status: DC | PRN

## 2013-11-18 MED ORDER — DEXTROSE-NACL 5-0.45 % IV SOLN
INTRAVENOUS | Status: DC
  Administered 2013-11-18 – 2013-11-20 (×4): via INTRAVENOUS

## 2013-11-18 MED ORDER — HEPARIN SODIUM (PORCINE) 5000 UNIT/ML IJ SOLN
5000.0000 [IU] | Freq: Three times a day (TID) | INTRAMUSCULAR | Status: DC
  Administered 2013-11-18 – 2013-11-21 (×10): 5000 [IU] via SUBCUTANEOUS
  Filled 2013-11-18 (×14): qty 1

## 2013-11-18 MED ORDER — SODIUM CHLORIDE 0.9 % IJ SOLN
3.0000 mL | Freq: Two times a day (BID) | INTRAMUSCULAR | Status: DC
  Administered 2013-11-18 – 2013-11-21 (×3): 3 mL via INTRAVENOUS

## 2013-11-18 MED ORDER — SENNA 8.6 MG PO TABS
2.0000 | ORAL_TABLET | Freq: Every day | ORAL | Status: DC | PRN
  Filled 2013-11-18: qty 2

## 2013-11-18 MED ORDER — CARBIDOPA-LEVODOPA 25-100 MG PO TABS
1.0000 | ORAL_TABLET | Freq: Three times a day (TID) | ORAL | Status: DC
  Administered 2013-11-19 – 2013-11-21 (×7): 1 via ORAL
  Filled 2013-11-18 (×12): qty 1

## 2013-11-18 MED ORDER — ONDANSETRON HCL 4 MG/2ML IJ SOLN
4.0000 mg | Freq: Four times a day (QID) | INTRAMUSCULAR | Status: DC | PRN
Start: 2013-11-18 — End: 2013-11-21

## 2013-11-18 NOTE — Progress Notes (Addendum)
INITIAL NUTRITION ASSESSMENT  DOCUMENTATION CODES Per approved criteria  -Severe malnutrition in the context of chronic illness   INTERVENTION:  If EN started, recommend Jevity 1.2 formula -- initiate at 20 ml/hr and increase by 10 ml every 8 hours to goal rate of 40 ml/hr to provide 1152 kcals, 53 gm protein, 775 ml of free water  Monitor Mg, Phos, K+ levels RD to follow for nutrition care plan  NUTRITION DIAGNOSIS: Inadequate oral intake related to inability to eat as evidenced by NPO status  Goal: Pt to meet >/= 90% of their estimated nutrition needs   Monitor:  EN initiation vs PO diet advancement, weight, labs, I/O's  Reason for Assessment: Low Braden  80 y.o. female  Admitting Dx: lethargy  ASSESSMENT: Patient with PMH of Parkinson's disease, hypertension, hyperlipidemia, and CAD presents with increasing somnolence x 24 hours; admitted for acute encephalopathy.  RD unable to obtain nutrition hx; patient unresponsive; noted patient is followed by [facility]; hx of poor PO intake; has drank Ensure supplements at home; s/p bedside swallow evaluation this AM ---> patient at severe aspiration risk, SLP recommending NPO status at this time.  Nutrition Focused Physical Exam:  Subcutaneous Fat:  Orbital Region: moderate depletion Upper Arm Region: severe depletion Thoracic and Lumbar Region: N/A  Muscle:  Temple Region: moderate depletion Clavicle Bone Region: severe depletion Clavicle and Acromion Bone Region: severe depletion Scapular Bone Region: severe depletion  Dorsal Hand: severe depletion  Patellar Region: N/A Anterior Thigh Region: N/A Posterior Calf Region: N/A  Edema: none  Patient continues to meet criteria for severe malnutrition in the context of chronic illness as evidenced by severe muscle loss & severe subcutaneous fat loss.  Patient is at refeeding risk if EN support started given malnutrition.    Height: Ht  Readings from Last 1 Encounters:  10/16/13 4\' 8"  (1.422 m)    Weight: Wt Readings from Last 1 Encounters:  11/18/13 97 lb 3.6 oz (44.1 kg)    Ideal Body Weight: 93 lb  % Ideal Body Weight: 104%  Wt Readings from Last 10 Encounters:  11/18/13 97 lb 3.6 oz (44.1 kg)  10/19/13 79 lb 5.9 oz (36 kg)  09/05/13 106 lb (48.081 kg)  08/25/13 106 lb (48.081 kg)  07/13/13 100 lb 12.8 oz (45.723 kg)  06/06/13 104 lb (47.174 kg)  05/25/13 104 lb (47.174 kg)  05/04/13 97 lb (43.999 kg)  03/14/13 104 lb (47.174 kg)  01/20/13 108 lb (48.988 kg)    Usual Body Weight: 100 lb  % Usual Body Weight: 97%  BMI:  Body mass index is 21.81 kg/(m^2).  Estimated Nutritional Needs: Kcal: 1100-1300 Protein: 50-60 gm Fluid: >/= 1.5 L  Skin: Intact  Diet Order: NPO  EDUCATION NEEDS: -No education needs identified at this time   Intake/Output Summary (Last 24 hours) at 11/18/13 1346 Last data filed at 11/18/13 1230  Gross per 24 hour  Intake 386.25 ml  Output      0 ml  Net 386.25 ml    Labs:   Recent Labs Lab 11/17/13 2339 11/18/13 0610  NA 144 145  K 4.7 4.7  CL 105 108  CO2 24 23  BUN 36* 33*  CREATININE 1.14* 1.08  CALCIUM 9.8 9.3  GLUCOSE 90 81    CBG (last 3)   Recent Labs  11/17/13 2326  GLUCAP 89    Scheduled Meds: . amLODipine  10 mg Oral Daily  . aspirin  81 mg Oral q  morning - 10a  . carbidopa-levodopa  1 tablet Oral TID  . docusate sodium  100 mg Oral BID  . heparin  5,000 Units Subcutaneous Q8H  . pantoprazole (PROTONIX) IV  40 mg Intravenous Q12H  . sodium chloride  3 mL Intravenous Q12H    Continuous Infusions: . dextrose 5 % and 0.45% NaCl 75 mL/hr at 11/18/13 1100    Past Medical History  Diagnosis Date  . MI, old   . Parkinson disease   . Scoliosis   . Arthritis   . Bladder cancer   . HTN (hypertension) 04/21/2012  . Hyperlipidemia 04/21/2012  . DM (diabetes mellitus) 04/21/2012  . CAD (coronary artery disease) 04/21/2012  . Barrett  esophagus 04/21/2012  . PUD (peptic ulcer disease) 04/21/2012  . Adrenal insufficiency 04/21/2012  . Raynaud's disease 04/21/2012  . Anxiety 04/21/2012  . Fibromyalgia 04/21/2012  . Hx of bladder cancer 04/21/2012  . Edema of lower extremity 04/21/2012    Chronic lower extremity edema  . Falls 04/21/2012  . Recurrent UTI 01/20/2013    Past Surgical History  Procedure Laterality Date  . Tonsillectomy    . Appendectomy    . Abdominal hysterectomy      Arthur Holms, RD, LDN Pager #: [phone] After-Hours Pager #: [phone]

## 2013-11-18 NOTE — Progress Notes (Signed)
[facility] PROGRESS NOTE  [name] MGQ:[id number] DOB: [birth year] DOA: 11/17/2013 PCP: Reginia Naas, MD  Assessment/Plan: 1. Acute encephalopathy. I suspect medication induced as her symptoms apparently started after taking baclofen and benzodiazepine therapy. All psychotropic medications have been held. This morning she remains sedated. We'll continue providing supportive care, IV fluids, simplifying her medication regimen. 2. Advanced Parkinson's disease. Will continue carbidopa/levodopa. Because of worsening hallucinations amantadine and Mirapex were discontinued on a recent hospitalization. 3. Stage IV chronic kidney disease. Patient's creatinine actually improving this mornings lab work. 4. Type 2 diabetes mellitus, diet controlled. 5. Diet. Patient presently n.p.o.  Code Status: DO NOT RESUSCITATE Disposition Plan: Continue supportive care, IV fluids, minimize psychotropic medication   HPI/Subjective: Patient is a pleasant 80 year old female with a past medical history of advanced Parkinson's disease, who was recently admitted to [facility] long hospital on 10/16/2013 presenting with confusion, agitation, hallucinations attributed to medications. During that hospitalization amantadine and Mirapex were discontinued and she was continued on carbidopa/levodopa. It was felt that the dopamine agonist or contributing to patient's hallucinations. She was discharged to skilled nursing facility, recently discharged from SNF to home. She was started on baclofen on 11/16/2013 for leg cramps, after which she became increasingly somnolent. She had also been taking Valium during this time. Patient was brought to the emergency department on 11/18/2013 with somnolence, difficulties arousing, having a significant functional decline. During my evaluation this morning she continues to be somnolent, difficult to arouse.  Objective: Filed Vitals:   11/18/13 1553  BP: 155/64  Pulse: 63  Temp:  98 F (36.7 C)  Resp:     Intake/Output Summary (Last 24 hours) at 11/18/13 1657 Last data filed at 11/18/13 1230  Gross per 24 hour  Intake 386.25 ml  Output      0 ml  Net 386.25 ml   Filed Weights   11/18/13 0430  Weight: 44.1 kg (97 lb 3.6 oz)    Exam:   General:  Patient is sedated, difficult to arouse  Cardiovascular: Regular rate rhythm normal S1-S2  Respiratory: Normal respiratory effort, clear to auscultation bilaterally no wheezing rhonchi or  Abdomen: Soft nontender nondistended  Musculoskeletal: No extremity edema  Data Reviewed: Basic Metabolic Panel:  Recent Labs Lab 11/17/13 2339 11/18/13 0610  NA 144 145  K 4.7 4.7  CL 105 108  CO2 24 23  GLUCOSE 90 81  BUN 36* 33*  CREATININE 1.14* 1.08  CALCIUM 9.8 9.3   Liver Function Tests:  Recent Labs Lab 11/18/13 0610  AST 21  ALT 19  ALKPHOS 87  BILITOT 0.3  PROT 6.1  ALBUMIN 3.0*   No results found for this basename: LIPASE, AMYLASE,  in the last 168 hours No results found for this basename: AMMONIA,  in the last 168 hours CBC:  Recent Labs Lab 11/17/13 2339  WBC 7.5  NEUTROABS 5.3  HGB 11.7*  HCT 35.8*  MCV 88.0  PLT 328   Cardiac Enzymes: No results found for this basename: CKTOTAL, CKMB, CKMBINDEX, TROPONINI,  in the last 168 hours BNP (last 3 results)  Recent Labs  11/20/12 1548 09/05/13 0908  PROBNP 2104.0* 1649.0*   CBG:  Recent Labs Lab 11/17/13 2326  GLUCAP 89    Recent Results (from the past 240 hour(s))  MRSA PCR SCREENING     Status: Abnormal   Collection Time    11/18/13  9:27 AM      Result Value Range Status   MRSA by PCR POSITIVE (*) NEGATIVE  Final   Comment:            The GeneXpert MRSA Assay (FDA     approved for NASAL specimens     only), is one component of a     comprehensive MRSA colonization     surveillance program. It is not     intended to diagnose MRSA     infection nor to guide or     monitor treatment for     MRSA infections.      RESULT CALLED TO, READ BACK BY AND VERIFIED WITH:     PHAM RN 12:15 11/18/13 ([email or web address])     Studies: Ct Head Wo Contrast  11/18/2013   CLINICAL DATA:  Altered mental status  EXAM: CT HEAD WITHOUT CONTRAST  TECHNIQUE: Contiguous axial images were obtained from the base of the skull through the vertex without intravenous contrast.  COMPARISON:  Prior CT from 10/16/2013  FINDINGS: Advanced age-related atrophy with chronic microvascular ischemic changes is stable as compared to the prior exam. Prominent atherosclerotic calcifications noted within the carotid siphon on cyst and distal vertebral arteries.  There is no acute intracranial hemorrhage or infarct. No mass lesion or midline shift. Gray-white matter differentiation is well maintained. Ventricles are normal in size without evidence of hydrocephalus. No extra-axial fluid collection.  The calvarium is intact.  Orbital soft tissues are within normal limits.  The paranasal sinuses and mastoid air cells are well pneumatized and free of fluid.Scalp soft tissues are unremarkable.  IMPRESSION: 1. No acute intracranial abnormality. 2. Chronic involutional changes, unchanged.   Electronically Signed   By: Jeannine Boga M.D.   On: 11/18/2013 01:05   Dg Chest Port 1 View  11/18/2013   CLINICAL DATA:  Grunting respirations.  Somnolence.  EXAM: PORTABLE CHEST - 1 VIEW  COMPARISON:  Portable examination 10/16/2013. One view chest x-ray 09/05/2013. Two-view chest x-ray 04/21/2012.  FINDINGS: Markedly suboptimal inspiration. Blunting of the left costophrenic angle likely representing a left pleural effusion. Mild atelectasis in the left base without confluent airspace consolidation. Lungs otherwise clear. Pulmonary vascularity normal. No pneumothorax. Cardiac silhouette enlarged but stable.  IMPRESSION: 1. Suboptimal inspiration. Left pleural effusion and associated mild passive atelectasis in the left lower lobe. 2. Stable cardiomegaly without pulmonary edema.    Electronically Signed   By: Evangeline Dakin M.D.   On: 11/18/2013 01:05    Scheduled Meds: . amLODipine  10 mg Oral Daily  . aspirin  81 mg Oral q morning - 10a  . carbidopa-levodopa  1 tablet Oral TID  . docusate sodium  100 mg Oral BID  . heparin  5,000 Units Subcutaneous Q8H  . pantoprazole (PROTONIX) IV  40 mg Intravenous Q12H  . sodium chloride  3 mL Intravenous Q12H   Continuous Infusions: . dextrose 5 % and 0.45% NaCl 75 mL/hr at 11/18/13 1100    Active Problems:   Acute encephalopathy    Time spent: [place], [facility] Pager [phone]. If 7PM-7AM, please contact night-coverage at [email or web address], password [facility] 11/18/2013, 4:57 PM  LOS: 1 day

## 2013-11-18 NOTE — H&P (Signed)
[facility] History and Physical  BECCA VLASIC ZOX:[id number] DOB: 06-12-1928 DOA: 11/17/2013   PCP: Allean Found, MD   Chief Complaint: Lethargy  HPI:  80 year old female with a history of Parkinson's disease, hypertension, hyperlipidemia, and CAD presents with increasing somnolence x24 hours. The patient is unable to provide any history at this time due to her somnolence. All of this history is obtained from speaking with Dr. Norlene Campbell and patient's husband at bedside. The patient was recently discharged from [facility] after a stay for increasing agitation which was attributable to her medications and acute on chronic renal failure. She was discharged to [facility] for approximately 20 days after which the patient has returned home. The patient has been receiving Valium 5 mg at bedtime for sleep as well as Ativan 0.5 mg every 8 hours when necessary leg cramps. The patient appears to have been having worsening leg cramps. As a result, the patient was started on baclofen on 11/16/2013. After taking a dose that evening, the patient has been somnolent and difficult to wake up. The husband states that she also received her Valium that evening. There's been no reports of respiratory distress, vomiting, or seizure type activity. The patient has not had any of her maintenance medications as result of her somnolence for the past 24 hours. EMS was activated as a result and recommended transfer to the hospital.  In the ED, BMP and CBC were essentially unremarkable. EKG is a sinus rhythm with nonspecific ST-T wave changes. Urinalysis did not have significant pyuria. Lactic acid was 0.70. Chest x-ray was negative for any infiltrate. CT brain was negative for acute findings.  Assessment/Plan: Acute encephalopathy -Most likely due to baclofen superimposed upon benzodiazepine use -Discontinue all hypnotics and baclofen -The patient had a metabolic workup which was essentially negative in December  2014. -TSH was 4.394, ammonia 28, RBC folate 1648, B12 greater than 2000 -npo until the patient is more alert -Speech therapy evaluation for swallowing -Discontinue Pepcid as this may also cause encephalopathy -Urinalysis negative for significant pyuria CKD stage IV -Patient's creatinine better than her usual baseline -Baseline creatinine 1.3-1.4  -Discontinue furosemide  -Gentle hydration--D5 1/2NS Parkinson's disease  -All  Dopamine agonists have been discontinued -continue sinemet Hypertension  -Continue amlodipine if patient is awake enough to take it  -Hydralazine prn SBP >180 Coronary artery disease  -Stable  -EKG negative for ST-T wave changes  -Continue aspirin 81 mg daily Diabetes mellitus type 2 -Patient had been diet controlled -Discontinue all CBG checks -last Hgb A1C 6.6 Leg cramps -venous duplex r/o DVT        Past Medical History  Diagnosis Date  . MI, old   . Parkinson disease   . Scoliosis   . Arthritis   . Bladder cancer   . HTN (hypertension) 04/21/2012  . Hyperlipidemia 04/21/2012  . DM (diabetes mellitus) 04/21/2012  . CAD (coronary artery disease) 04/21/2012  . Barrett esophagus 04/21/2012  . PUD (peptic ulcer disease) 04/21/2012  . Adrenal insufficiency 04/21/2012  . Raynaud's disease 04/21/2012  . Anxiety 04/21/2012  . Fibromyalgia 04/21/2012  . Hx of bladder cancer 04/21/2012  . Edema of lower extremity 04/21/2012    Chronic lower extremity edema  . Falls 04/21/2012  . Recurrent UTI 01/20/2013   Past Surgical History  Procedure Laterality Date  . Tonsillectomy    . Appendectomy    . Abdominal hysterectomy     Social History:  reports that she has quit smoking. Her smoking use included Cigarettes. She smoked  0.00 packs per day. She has never used smokeless tobacco. She reports that she does not drink alcohol or use illicit drugs.   Family History  Problem Relation Age of Onset  . Heart disease Mother   . Diabetes Mother   . Bladder  Cancer Maternal Aunt      Allergies  Allergen Reactions  . Mirtazapine Other (See Comments)    confusion  . Codeine Hives  . Latex Rash    Use cotton dressings      Prior to Admission medications   Medication Sig Start Date End Date Taking? Authorizing Provider  amLODipine (NORVASC) 10 MG tablet Take 10 mg by mouth every morning.    Yes Historical Provider, MD  aspirin 81 MG tablet Take 81 mg by mouth every morning.    Yes Historical Provider, MD  baclofen (LIORESAL) 10 MG tablet Take 10 mg by mouth daily.    Yes Historical Provider, MD  Biotin 5000 MCG CAPS Take 1 capsule by mouth 2 (two) times daily.   Yes Historical Provider, MD  carbidopa-levodopa (SINEMET IR) 25-100 MG per tablet Take 1 tablet by mouth 3 (three) times daily.   Yes Historical Provider, MD  Cholecalciferol 2000 UNITS TABS Take 1 tablet by mouth every morning.   Yes Historical Provider, MD  diazepam (VALIUM) 5 MG tablet Take 5 mg by mouth every evening.   Yes Historical Provider, MD  docusate sodium (COLACE) 100 MG capsule Take 100 mg by mouth 2 (two) times daily.   Yes Historical Provider, MD  famotidine (PEPCID) 20 MG tablet Take 20 mg by mouth 2 (two) times daily.   Yes Historical Provider, MD  furosemide (LASIX) 40 MG tablet Take 0.5 tablets (20 mg total) by mouth daily. 10/21/13  Yes Jeralyn Bennett, MD  LORazepam (ATIVAN) 0.5 MG tablet Take 1 tablet (0.5 mg total) by mouth every 8 (eight) hours as needed for sleep. 10/21/13  Yes Jeralyn Bennett, MD  Multiple Vitamins-Minerals (OCUVITE PO) Take 1 tablet by mouth every evening.   Yes Historical Provider, MD  Omega-3 Fatty Acids (FISH OIL) 1000 MG CAPS Take 1,000 mg by mouth every evening.   Yes Historical Provider, MD  pantoprazole (PROTONIX) 40 MG tablet Take 40 mg by mouth 2 (two) times daily.   Yes Historical Provider, MD  senna (SENOKOT) 8.6 MG TABS tablet Take 2 tablets by mouth daily as needed for mild constipation.   Yes Historical Provider, MD     Review of Systems:  Unobtainable secondary to the patient's acute encephalopathy  Physical Exam: Filed Vitals:   11/17/13 2313 11/17/13 2314 11/17/13 2338 11/17/13 2346  BP: 196/98  184/88 164/83  Pulse: 68  75 74  Temp:   97 F (36.1 C)   TempSrc:   Rectal   Resp: 22  20 20   SpO2: 100% 100%  96%   General:  somnolent, NAD, nontoxic Head/Eye: No conjunctival hemorrhage, no icterus, [place]/AT, No nystagmus ENT:  No icterus,  Neck:  No masses, no lymphadenpathy, CV:  RRR, no rub, no gallop, no S3 Lung:  Poor inspiratory effort but essentially clear to auscultation Abdomen: soft/NT, +BS, nondistended, no peritoneal signs Ext: No cyanosis, No rashes, No petechiae, No lymphangitis, Trace LE edema Neuro: Patient moves all 4 extremities antigravity to protopathic stimuli. Pupils equal and reactive bilateral. Intermittently opens eyes.  Labs on Admission:  Basic Metabolic Panel:  Recent Labs Lab 11/17/13 2339  NA 144  K 4.7  CL 105  CO2 24  GLUCOSE 90  BUN 36*  CREATININE 1.14*  CALCIUM 9.8   Liver Function Tests: No results found for this basename: AST, ALT, ALKPHOS, BILITOT, PROT, ALBUMIN,  in the last 168 hours No results found for this basename: LIPASE, AMYLASE,  in the last 168 hours No results found for this basename: AMMONIA,  in the last 168 hours CBC:  Recent Labs Lab 11/17/13 2339  WBC 7.5  NEUTROABS 5.3  HGB 11.7*  HCT 35.8*  MCV 88.0  PLT 328   Cardiac Enzymes: No results found for this basename: CKTOTAL, CKMB, CKMBINDEX, TROPONINI,  in the last 168 hours BNP: No components found with this basename: POCBNP,  CBG:  Recent Labs Lab 11/17/13 2326  GLUCAP 89    Radiological Exams on Admission: Ct Head Wo Contrast  11/18/2013   CLINICAL DATA:  Altered mental status  EXAM: CT HEAD WITHOUT CONTRAST  TECHNIQUE: Contiguous axial images were obtained from the base of the skull through the vertex without intravenous contrast.  COMPARISON:  Prior CT from  10/16/2013  FINDINGS: Advanced age-related atrophy with chronic microvascular ischemic changes is stable as compared to the prior exam. Prominent atherosclerotic calcifications noted within the carotid siphon on cyst and distal vertebral arteries.  There is no acute intracranial hemorrhage or infarct. No mass lesion or midline shift. Gray-white matter differentiation is well maintained. Ventricles are normal in size without evidence of hydrocephalus. No extra-axial fluid collection.  The calvarium is intact.  Orbital soft tissues are within normal limits.  The paranasal sinuses and mastoid air cells are well pneumatized and free of fluid.Scalp soft tissues are unremarkable.  IMPRESSION: 1. No acute intracranial abnormality. 2. Chronic involutional changes, unchanged.   Electronically Signed   By: Rise Mu M.D.   On: 11/18/2013 01:05   Dg Chest Port 1 View  11/18/2013   CLINICAL DATA:  Grunting respirations.  Somnolence.  EXAM: PORTABLE CHEST - 1 VIEW  COMPARISON:  Portable examination 10/16/2013. One view chest x-ray 09/05/2013. Two-view chest x-ray 04/21/2012.  FINDINGS: Markedly suboptimal inspiration. Blunting of the left costophrenic angle likely representing a left pleural effusion. Mild atelectasis in the left base without confluent airspace consolidation. Lungs otherwise clear. Pulmonary vascularity normal. No pneumothorax. Cardiac silhouette enlarged but stable.  IMPRESSION: 1. Suboptimal inspiration. Left pleural effusion and associated mild passive atelectasis in the left lower lobe. 2. Stable cardiomegaly without pulmonary edema.   Electronically Signed   By: Hulan Saas M.D.   On: 11/18/2013 01:05    EKG: Independently reviewed. Rhythm, nonspecific ST-T wave changes    Time spent:60 minutes Code Status:   DNR Family Communication:   Husband at bedside   Khamani Fairley, DO  [facility] Pager [phone]  If 7PM-7AM, please contact night-coverage [email or web address] Password  [facility] 11/18/2013, 2:38 AM

## 2013-11-18 NOTE — ED Provider Notes (Signed)
CSN: [id number]     Arrival date & time 11/17/13  2312 History   First MD Initiated Contact with Patient 11/17/13 2318     Chief Complaint  Patient presents with  . Loss of Consciousness   (Consider location/radiation/quality/duration/timing/severity/associated sxs/prior Treatment) HPI 80 year old female presents to emergency room from home via EMS with reported altered mental status.  Per EMS, patient has had decreased level of consciousness since 8:30 PM yesterday.  Husband attributed to baclofen.  Patient is on hospice for severe Parkinson's, DO NOT RESUSCITATE.  Patient has been minimally responsive for EMS.  No reported recent fevers, nausea, vomiting, diarrhea.  Patient will answer yes no questions with shaking her head or grunting.  She denies any pain. Past Medical History  Diagnosis Date  . MI, old   . Parkinson disease   . Scoliosis   . Arthritis   . Bladder cancer   . HTN (hypertension) 04/21/2012  . Hyperlipidemia 04/21/2012  . DM (diabetes mellitus) 04/21/2012  . CAD (coronary artery disease) 04/21/2012  . Barrett esophagus 04/21/2012  . PUD (peptic ulcer disease) 04/21/2012  . Adrenal insufficiency 04/21/2012  . Raynaud's disease 04/21/2012  . Anxiety 04/21/2012  . Fibromyalgia 04/21/2012  . Hx of bladder cancer 04/21/2012  . Edema of lower extremity 04/21/2012    Chronic lower extremity edema  . Falls 04/21/2012  . Recurrent UTI 01/20/2013   Past Surgical History  Procedure Laterality Date  . Tonsillectomy    . Appendectomy    . Abdominal hysterectomy     Family History  Problem Relation Age of Onset  . Heart disease Mother   . Diabetes Mother   . Bladder Cancer Maternal Aunt    History  Substance Use Topics  . Smoking status: Former Smoker    Types: Cigarettes  . Smokeless tobacco: Never Used  . Alcohol Use: No   OB History   Grav Para Term Preterm Abortions TAB SAB Ect Mult Living                 Review of Systems  Unable to perform ROS: Mental status  change    Allergies  Mirtazapine; Codeine; and Latex  Home Medications   Current Outpatient Rx  Name  Route  Sig  Dispense  Refill  . amLODipine (NORVASC) 10 MG tablet   Oral   Take 10 mg by mouth every morning.          Marland Kitchen aspirin 81 MG tablet   Oral   Take 81 mg by mouth every morning.          . baclofen (LIORESAL) 10 MG tablet   Oral   Take 10 mg by mouth daily.          . Biotin 5000 MCG CAPS   Oral   Take 1 capsule by mouth 2 (two) times daily.         . carbidopa-levodopa (SINEMET IR) 25-100 MG per tablet   Oral   Take 1 tablet by mouth 3 (three) times daily.         . Cholecalciferol 2000 UNITS TABS   Oral   Take 1 tablet by mouth every morning.         . diazepam (VALIUM) 5 MG tablet   Oral   Take 5 mg by mouth every evening.         . docusate sodium (COLACE) 100 MG capsule   Oral   Take 100 mg by mouth 2 (two) times daily.         Marland Kitchen  famotidine (PEPCID) 20 MG tablet   Oral   Take 20 mg by mouth 2 (two) times daily.         . furosemide (LASIX) 40 MG tablet   Oral   Take 0.5 tablets (20 mg total) by mouth daily.   30 tablet   0   . LORazepam (ATIVAN) 0.5 MG tablet   Oral   Take 1 tablet (0.5 mg total) by mouth every 8 (eight) hours as needed for sleep.   30 tablet   0   . Multiple Vitamins-Minerals (OCUVITE PO)   Oral   Take 1 tablet by mouth every evening.         . Omega-3 Fatty Acids (FISH OIL) 1000 MG CAPS   Oral   Take 1,000 mg by mouth every evening.         . pantoprazole (PROTONIX) 40 MG tablet   Oral   Take 40 mg by mouth 2 (two) times daily.         Marland Kitchen senna (SENOKOT) 8.6 MG TABS tablet   Oral   Take 2 tablets by mouth daily as needed for mild constipation.          BP 164/83  Pulse 74  Temp(Src) 97 F (36.1 C) (Rectal)  Resp 20  SpO2 96% Physical Exam  Constitutional: She appears distressed.  Frail elderly female, in moderate distress, grunting, with breath  HENT:  Head: Normocephalic and  atraumatic.  Dry mucous membranes  Eyes: Conjunctivae are normal. Pupils are equal, round, and reactive to light.  Neck: Normal range of motion. Neck supple. JVD present. No tracheal deviation present. No thyromegaly present.  Cardiovascular: Normal rate, regular rhythm, normal heart sounds and intact distal pulses.   Pulmonary/Chest: Effort normal. No stridor. No respiratory distress. She has no wheezes. She has no rales. She exhibits no tenderness.  Patient grunting with exhalation  Abdominal: Soft. Bowel sounds are normal. She exhibits no distension and no mass. There is no tenderness. There is no rebound and no guarding.  Musculoskeletal: She exhibits edema. She exhibits no tenderness.  Lymphadenopathy:    She has no cervical adenopathy.  Neurological:  Patient will shake ever crunch with yes no questions.  She will open eyes barely and briefly  Skin: Skin is warm and dry. No rash noted. No erythema. No pallor.    ED Course  Procedures (including critical care time) Labs Review Labs Reviewed  CBC WITH DIFFERENTIAL - Abnormal; Notable for the following:    Hemoglobin 11.7 (*)    HCT 35.8 (*)    RDW 15.9 (*)    All other components within normal limits  BASIC METABOLIC PANEL - Abnormal; Notable for the following:    BUN 36 (*)    Creatinine, Ser 1.14 (*)    GFR calc non Af Amer 43 (*)    GFR calc Af Amer 50 (*)    All other components within normal limits  URINALYSIS, ROUTINE W REFLEX MICROSCOPIC - Abnormal; Notable for the following:    Leukocytes, UA TRACE (*)    All other components within normal limits  URINE MICROSCOPIC-ADD ON - Abnormal; Notable for the following:    Bacteria, UA MANY (*)    All other components within normal limits  URINE CULTURE  GLUCOSE, CAPILLARY  CG4 I-STAT (LACTIC ACID)   Imaging Review Ct Head Wo Contrast  11/18/2013   CLINICAL DATA:  Altered mental status  EXAM: CT HEAD WITHOUT CONTRAST  TECHNIQUE: Contiguous axial images were obtained  from  the base of the skull through the vertex without intravenous contrast.  COMPARISON:  Prior CT from 10/16/2013  FINDINGS: Advanced age-related atrophy with chronic microvascular ischemic changes is stable as compared to the prior exam. Prominent atherosclerotic calcifications noted within the carotid siphon on cyst and distal vertebral arteries.  There is no acute intracranial hemorrhage or infarct. No mass lesion or midline shift. Gray-white matter differentiation is well maintained. Ventricles are normal in size without evidence of hydrocephalus. No extra-axial fluid collection.  The calvarium is intact.  Orbital soft tissues are within normal limits.  The paranasal sinuses and mastoid air cells are well pneumatized and free of fluid.Scalp soft tissues are unremarkable.  IMPRESSION: 1. No acute intracranial abnormality. 2. Chronic involutional changes, unchanged.   Electronically Signed   By: Jeannine Boga M.D.   On: 11/18/2013 01:05   Dg Chest Port 1 View  11/18/2013   CLINICAL DATA:  Grunting respirations.  Somnolence.  EXAM: PORTABLE CHEST - 1 VIEW  COMPARISON:  Portable examination 10/16/2013. One view chest x-ray 09/05/2013. Two-view chest x-ray 04/21/2012.  FINDINGS: Markedly suboptimal inspiration. Blunting of the left costophrenic angle likely representing a left pleural effusion. Mild atelectasis in the left base without confluent airspace consolidation. Lungs otherwise clear. Pulmonary vascularity normal. No pneumothorax. Cardiac silhouette enlarged but stable.  IMPRESSION: 1. Suboptimal inspiration. Left pleural effusion and associated mild passive atelectasis in the left lower lobe. 2. Stable cardiomegaly without pulmonary edema.   Electronically Signed   By: Evangeline Dakin M.D.   On: 11/18/2013 01:05    EKG Interpretation    Date/Time:  Thursday November 17 2013 23:15:52 EST Ventricular Rate:  70 PR Interval:    QRS Duration: 95 QT Interval:  430 QTC Calculation: 464 R  Axis:   -55 Text Interpretation:  Normal sinus rhythm Left anterior fascicular block Abnormal R-wave progression, early transition artifact on baseline No significant change since last tracing Confirmed by Neylan Koroma  MD, Escarlet Saathoff [id number]) on 11/17/2013 11:41:06 PM            MDM   1. Altered mental status    80 year old female with altered mental status.  Prior records reviewed, she was discharged from the nursing facility on January 8, 8 days ago, and noted that time.  Before that.  She was awake, alert, and able to converse.  Unclear etiology of mental status change.  Recent admission last month for similar picture, thought to be multifactorial at that time.  Plan for head CT, UA, labs.    1:39 AM Discussed patient with her husband.  He reports that she had been on Valium 3 times a day and Ativan each bedtime to help with sleep, and muscle spasms in her legs from her Parkinson's.  He reports these medications helped for the most part, but patient was very somnolent slept most days.  He discussed medications with hospice care, who recommended starting baclofen.  Patient received first dose of baclofen on Wednesday evening around 8:30.  Patient has been asleep since that time.  He has had difficulties arousing her.  She has not eaten or drank since taking the baclofen.  He reports that he gave her senna for constipation, and she has been incontinent of stool throughout the day today.  Patient lives alone with her husband.  He reports that hospice, to check on her 3 times a week, and he also has a new independent living assistant, coming 3 times a week.  He is unsure at this time.  If he is able to care for her.  If she continues to be so sedated.  Kalman Drape, MD 11/18/13 [phone]

## 2013-11-18 NOTE — Progress Notes (Signed)
[facility] RN Visit- M. Wynetta Emery, RN  Related admission to [facility] diagnosis of Parkinson's Disease; Code status is a DNR -OOF DNR is on shadow chart   Pt seen lying in bed, no response to verbal or tactile stimuli; respirations shallow, even, no outward s/sx of distress or discomfort on this visit; no family present.  Spoke with staff RN who indicated pt's husband has spoken with her for update, and indicated he is exhausted after being in the ED most of the night and plans to return to hospital after he gets some rest today  [facility] continues to follow patient during hospital stay.Patient's home medication list and transfer summary  is on shadow chart.   Please call [facility] @ [phone]- ask for RN Liaison or after hours,ask for on-call RN with any hospice needs.   Thank you.  Danton Sewer, RN  [facility]  Hospice Liaison  [phone])

## 2013-11-18 NOTE — Progress Notes (Signed)
*  PRELIMINARY RESULTS* Vascular Ultrasound Lower extremity venous duplex has been completed.  Preliminary findings: technically limited due to immobility. No obvious evidence of DVT.   Landry Mellow, RDMS, RVT  11/18/2013, 12:22 PM

## 2013-11-18 NOTE — Progress Notes (Signed)
Patient ID: [name], female   DOB: 12/05/1927, 80 y.o.   MRN: [id number]               PROGRESS NOTE  DATE: 10/23/2013  FACILITY: Nursing Home Location: [facility]  LEVEL OF CARE: SNF (31)  Acute Visit  CHIEF COMPLAINT:  Follow-up Hospitalization  HISTORY OF PRESENT ILLNESS: This is an 80 year old female who was admitted to [facility] on 10/21/13 from [facility]. She has been having worsening agitation, confusion and hallucination so she was brought to the hospital. She has been admitted for a short-term rehabilitation.   REASSESSMENT OF ONGOING PROBLEM(S):  HTN: Pt 's HTN remains stable.  Denies CP, sob, DOE, pedal edema, headaches, dizziness or visual disturbances.  No complications from the medications currently being used.  Last BP : 122/64  PARKINSON'S DISEASE: pt's Parkinson's disease is stable.  Denies progression of sx recently.  Pt is tolerating Parkinson's disease medications without any complications.  GERD: pt's GERD is stable.  Denies ongoing heartburn, abd. Pain, nausea or vomiting.  Currently on a PPI & tolerates it without any adverse reactions.   PAST MEDICAL HISTORY : Reviewed.  No changes.  CURRENT MEDICATIONS: Reviewed per [facility]  REVIEW OF SYSTEMS: unobtainable due to being poor historian   PHYSICAL EXAMINATION  VS:  T 97.4      P 65      RR 20      BP 122/64     POX 97 %     WT 97.6 (Lb)  GENERAL: no acute distress, normal body habitus EYES: conjunctivae normal, sclerae normal, normal eye lids NECK: supple, trachea midline, no neck masses, no thyroid tenderness, no thyromegaly LYMPHATICS: no LAN in the neck, no supraclavicular LAN RESPIRATORY: breathing is even & unlabored, BS CTAB CARDIAC: RRR, no murmur,no extra heart sounds, no edema GI: abdomen soft, normal BS, no masses, no tenderness, no hepatomegaly, no splenomegaly   LABS/RADIOLOGY: 10/20/13 sodium 134 potassium 4.0 glucose 102 BUN 36 creatinine 1.34 calcium  8.9 WBC 7.3 hemoglobin 11.2 hematocrit 33.6   ASSESSMENT/PLAN:  Generalized weakness - for rehabilitation  Hypertension - well controlled; continue Norvasc  Parkinson's disease - continue Sinemet  GERD - stable; continue Pepcid and Protonix  CHF - stable; continue Lasix     CPT CODE: 19622

## 2013-11-18 NOTE — Progress Notes (Signed)
Home Care SW made visit to Patient after her admission.  Patient was asleep and did not wake up when spoken to.  No family present.  SW will talk to Patient's Husband by phone and offer support.  SW will continue to follow and assist with discharge plans as needed.  Cecilio Asper, LCSW, [facility]-SW [facility] and [facility] [place]

## 2013-11-18 NOTE — Care Management Note (Signed)
    Page 1 of 1   11/21/2013     4:32:08 PM   CARE MANAGEMENT NOTE 11/21/2013  Patient:  [name], [name]   Account Number:  [id number]  Date Initiated:  11/18/2013  Documentation initiated by:  Jesua Tamblyn  Subjective/Objective Assessment:   PT ADM ON 1/15 WITH AMS.  PTA, PT RESIDED AT [facility].  PT RECENTLY AT [place].     Action/Plan:   PT FOLLOWED BY HOSPICE AND PALLIATIVE CARE  OF [place] AT HOME.  WILL CONT TO FOLLOW FOR DC NEEDS.   Anticipated DC Date:  11/22/2013   Anticipated DC Plan:  [place]  In-house referral  Clinical Social Worker      DC Planning Services  CM consult      Choice offered to / List presented to:     DME arranged  OTHER - SEE COMMENT           Status of service:  Completed, signed off Medicare Important Message given?   (If response is "NO", the following Medicare IM given date fields will be blank) Date Medicare IM given:   Date Additional Medicare IM given:    Discharge Disposition:  [place]  Per UR Regulation:  Reviewed for med. necessity/level of care/duration of stay  If discussed at [place] of Stay Meetings, dates discussed:    Comments:  11/21/13 Jalon Blackwelder,RN,BSN [phone] [name].  PT NEEDS TRANSFER BOARD FOR HOME. REFERRAL TO [facility] FOR DME NEEDS.  HOSPICE TO PROVIDE HHPT AND GAIT BELT FOR PT.  REFERRAL TO CSW FOR AMBULANCE TRANSPORT HOME.

## 2013-11-18 NOTE — Progress Notes (Signed)
Patient admitted from ED via stretcher with chief complaint increasing somnolence. Patient is GCS 9; on room air. Hooked to monitor; vital signs were stable. With 1 PIV, saline locked and intact. Other initial assessment, please see flow sheet. Will endorse accordingly.

## 2013-11-18 NOTE — Evaluation (Signed)
Clinical/Bedside Swallow Evaluation Patient Details  Name: MICAL BRUN MRN: [id number] Date of Birth: 1928/05/17  Today's Date: 11/18/2013 Time: 0920-0936 SLP Time Calculation (min): 16 min  Past Medical History:  Past Medical History  Diagnosis Date  . MI, old   . Parkinson disease   . Scoliosis   . Arthritis   . Bladder cancer   . HTN (hypertension) 04/21/2012  . Hyperlipidemia 04/21/2012  . DM (diabetes mellitus) 04/21/2012  . CAD (coronary artery disease) 04/21/2012  . Barrett esophagus 04/21/2012  . PUD (peptic ulcer disease) 04/21/2012  . Adrenal insufficiency 04/21/2012  . Raynaud's disease 04/21/2012  . Anxiety 04/21/2012  . Fibromyalgia 04/21/2012  . Hx of bladder cancer 04/21/2012  . Edema of lower extremity 04/21/2012    Chronic lower extremity edema  . Falls 04/21/2012  . Recurrent UTI 01/20/2013   Past Surgical History:  Past Surgical History  Procedure Laterality Date  . Tonsillectomy    . Appendectomy    . Abdominal hysterectomy     HPI:  80 year old female with a history of Parkinson's disease, Barrett's Esophagus and esophageal dysphagia; presents with increasing somnolence x24 hours. The patient is unable to provide any history at this time due to her somnolence. All of this history is obtained from speaking with Dr. Sharol Given and patient's husband at bedside. The patient was recently discharged from [facility] after a stay for increasing agitation which was attributable to her medications and acute on chronic renal failure. She was discharged to [facility] for approximately 20 days after which the patient has returned home. The patient has been receiving Valium 5 mg at bedtime for sleep as well as Ativan 0.5 mg every 8 hours when necessary leg cramps. The patient appears to have been having worsening leg cramps. As a result, the patient was started on baclofen on 11/16/2013. After taking a dose that evening, the patient has been somnolent and difficult to wake up. The husband  states that she also received her Valium that evening. There's been no reports of respiratory distress, vomiting, or seizure type activity. The patient has not had any of her maintenance medications as result of her somnolence for the past 24 hours. EMS was activated as a result and recommended transfer to the hospital.   Assessment / Plan / Recommendation Clinical Impression  Pt was not yet sufficitely arousable to max cues. Oral care performed, dentures placed in cup with name tag. Pt will need further recovery prior to diet. SLP will f/u tomorrow for attempts.     Aspiration Risk  Severe    Diet Recommendation NPO        Other  Recommendations Oral Care Recommendations: Oral care Q4 per protocol   Follow Up Recommendations       Frequency and Duration min 2x/week  2 weeks   Pertinent Vitals/Pain NA    SLP Swallow Goals     Swallow Study Prior Functional Status       General HPI: 80 year old female with a history of Parkinson's disease, Barrett's Esophagus and esophageal dysphagia; presents with increasing somnolence x24 hours. The patient is unable to provide any history at this time due to her somnolence. All of this history is obtained from speaking with Dr. Sharol Given and patient's husband at bedside. The patient was recently discharged from [facility] after a stay for increasing agitation which was attributable to her medications and acute on chronic renal failure. She was discharged to [facility] for approximately 20 days after which the patient has returned  home. The patient has been receiving Valium 5 mg at bedtime for sleep as well as Ativan 0.5 mg every 8 hours when necessary leg cramps. The patient appears to have been having worsening leg cramps. As a result, the patient was started on baclofen on 11/16/2013. After taking a dose that evening, the patient has been somnolent and difficult to wake up. The husband states that she also received her Valium that evening. There's been no  reports of respiratory distress, vomiting, or seizure type activity. The patient has not had any of her maintenance medications as result of her somnolence for the past 24 hours. EMS was activated as a result and recommended transfer to the hospital. Type of Study: Bedside swallow evaluation Previous Swallow Assessment: BSE x2 Diet Prior to this Study: NPO Temperature Spikes Noted: No Respiratory Status: Room air History of Recent Intubation: No Behavior/Cognition: Lethargic;Doesn't follow directions Oral Cavity - Dentition: Dentures, top Patient Positioning: Upright in bed Baseline Vocal Quality: Clear Volitional Cough: Cognitively unable to elicit    Oral/Motor/Sensory Function Overall Oral Motor/Sensory Function: Impaired (little movmement with max tactile cues) Mandible: Within Functional Limits   Ice Chips Ice chips: Impaired Presentation: Spoon Oral Phase Impairments: Poor awareness of bolus (no response to ice to lips)   Thin Liquid Thin Liquid: Not tested    Nectar Thick Nectar Thick Liquid: Not tested   Honey Thick Honey Thick Liquid: Not tested   Puree Puree: Not tested   Solid   GO    Solid: Not tested      Herbie Baltimore, MA CCC-SLP [phone]  Lynann Beaver 11/18/2013,9:44 AM

## 2013-11-19 DIAGNOSIS — E43 Unspecified severe protein-calorie malnutrition: Secondary | ICD-10-CM

## 2013-11-19 DIAGNOSIS — E860 Dehydration: Secondary | ICD-10-CM

## 2013-11-19 NOTE — Progress Notes (Addendum)
GIP Visit [facility] [facility] RN  Admission for [employer].Related to hospice diagnosis Parkinsons Disease. Patient is a DNR. Patient was alert and able to answer with one word answers. Spoke with nurse who states she is more alert than she was this morning. Nurse applied patients lipstick and washed/lotioned patients hands and patient expressed feeling better. No family as bedside at this time. Patient denies any pain at this time.  Please call [facility] @ [phone]- ask for on-call RN with any hospice needs.  Thank you ,Ardath Sax , RN

## 2013-11-19 NOTE — Evaluation (Signed)
Clinical/Bedside Swallow Evaluation Patient Details  Name: [name] MRN: [id number] Date of Birth: 1928-10-29  Today's Date: 11/19/2013 Time: 1022-1052 SLP Time Calculation (min): 30 min  Past Medical History:  Past Medical History  Diagnosis Date  . MI, old   . Parkinson disease   . Scoliosis   . Arthritis   . Bladder cancer   . HTN (hypertension) 04/21/2012  . Hyperlipidemia 04/21/2012  . DM (diabetes mellitus) 04/21/2012  . CAD (coronary artery disease) 04/21/2012  . Barrett esophagus 04/21/2012  . PUD (peptic ulcer disease) 04/21/2012  . Adrenal insufficiency 04/21/2012  . Raynaud's disease 04/21/2012  . Anxiety 04/21/2012  . Fibromyalgia 04/21/2012  . Hx of bladder cancer 04/21/2012  . Edema of lower extremity 04/21/2012    Chronic lower extremity edema  . Falls 04/21/2012  . Recurrent UTI 01/20/2013  . Encephalopathy 11/18/2013   Past Surgical History:  Past Surgical History  Procedure Laterality Date  . Tonsillectomy    . Appendectomy    . Abdominal hysterectomy     HPI:  80 year old female with a history of Parkinson's disease, Barrett's Esophagus and esophageal dysphagia; presents with increasing somnolence x24 hours. The patient is unable to provide any history at this time due to her somnolence. All of this history is obtained from speaking with Dr. Sharol Given and patient's husband at bedside. The patient was recently discharged from [facility] after a stay for increasing agitation which was attributable to her medications and acute on chronic renal failure. She was discharged to [facility] for approximately 20 days after which the patient has returned home. The patient has been receiving Valium 5 mg at bedtime for sleep as well as Ativan 0.5 mg every 8 hours when necessary leg cramps. The patient appears to have been having worsening leg cramps. As a result, the patient was started on baclofen on 11/16/2013. After taking a dose that evening, the patient has been somnolent and  difficult to wake up. The husband states that she also received her Valium that evening. There's been no reports of respiratory distress, vomiting, or seizure type activity. The patient has not had any of her maintenance medications as result of her somnolence for the past 24 hours. EMS was activated as a result and recommended transfer to the hospital.   Assessment / Plan / Recommendation Clinical Impression  Patient presents with a mod-severe oral-pharyngeal dysphagia with significant impact from cognitive deficits. Thin liquids were not tolerated secondary to poor oral prep phase with no to limited lip rounding when accepting bolus, resulting in poor coordination and control of bolus. Nectar thick liquids resulted in better control and management in oral phase and no overt s/s aspiration during pharyngeal phase. Patient has been placed on comfort measures/hospice care  and so recommendations by speech therapy are for puree solids nectar thick liquids, full assistance and supervision, but do not expect that patient will be able to sustain adequate nutrition via P.O.'s.    Aspiration Risk  Moderate    Comfort measures:  Diet Recommendation Dysphagia 1 (Puree);Nectar-thick liquid   Liquid Administration via: Cup;Spoon Medication Administration: Crushed with puree Supervision: Full supervision/cueing for compensatory strategies;Trained caregiver to feed patient Compensations: Slow rate;Small sips/bites Postural Changes and/or Swallow Maneuvers: Seated upright 90 degrees;Upright 30-60 min after meal    Other  Recommendations Oral Care Recommendations: Oral care Q4 per protocol Other Recommendations: Order thickener from pharmacy   Follow Up Recommendations  24 hour supervision/assistance;Skilled Nursing facility;Home health SLP    Frequency and Duration min  1 x/week  1 week   Pertinent Vitals/Pain     SLP Swallow Goals  tolerate comfort measures, dysphagia 1 (puree), nectar thick  liquids.  Family education regarding swallow precautions/recommendations.   Swallow Study Prior Functional Status       General HPI: 80 year old female with a history of Parkinson's disease, Barrett's Esophagus and esophageal dysphagia; presents with increasing somnolence x24 hours. The patient is unable to provide any history at this time due to her somnolence. All of this history is obtained from speaking with Dr. Sharol Given and patient's husband at bedside. The patient was recently discharged from [facility] after a stay for increasing agitation which was attributable to her medications and acute on chronic renal failure. She was discharged to [facility] for approximately 20 days after which the patient has returned home. The patient has been receiving Valium 5 mg at bedtime for sleep as well as Ativan 0.5 mg every 8 hours when necessary leg cramps. The patient appears to have been having worsening leg cramps. As a result, the patient was started on baclofen on 11/16/2013. After taking a dose that evening, the patient has been somnolent and difficult to wake up. The husband states that she also received her Valium that evening. There's been no reports of respiratory distress, vomiting, or seizure type activity. The patient has not had any of her maintenance medications as result of her somnolence for the past 24 hours. EMS was activated as a result and recommended transfer to the hospital. Previous Swallow Assessment: BSE Diet Prior to this Study: NPO Temperature Spikes Noted: No Respiratory Status: Room air History of Recent Intubation: No Behavior/Cognition: Alert;Requires cueing Oral Cavity - Dentition: Dentures, not available (top dentures not available ) Self-Feeding Abilities: Total assist Baseline Vocal Quality: Low vocal intensity;Clear Volitional Cough: Cognitively unable to elicit Volitional Swallow: Unable to elicit    Oral/Motor/Sensory Function Overall Oral Motor/Sensory Function:  Impaired Labial ROM: Reduced right;Reduced left Labial Strength: Reduced Facial ROM: Reduced right;Reduced left Facial Strength: Reduced   Ice Chips Ice chips: Impaired Presentation: Spoon Oral Phase Impairments: Reduced labial seal Pharyngeal Phase Impairments: Suspected delayed Swallow   Thin Liquid Thin Liquid: Impaired Presentation: Cup Oral Phase Impairments: Reduced labial seal;Reduced lingual movement/coordination;Poor awareness of bolus Pharyngeal  Phase Impairments: Cough - Delayed;Multiple swallows;Suspected delayed Swallow Other Comments: Poor oral prep phase with limited to no lip rounding on cup when accepting bolus    Nectar Thick Nectar Thick Liquid: Impaired Presentation: Cup Oral Phase Impairments: Reduced labial seal Pharyngeal Phase Impairments: Suspected delayed Swallow Other Comments: No overt s/s aspiration and appeared to orally manage this consistency better than thin liquids.   Honey Thick Honey Thick Liquid: Not tested   Puree Puree: Impaired Presentation: Spoon Oral Phase Impairments: Reduced labial seal   Solid   GO    Solid: Not tested       Dannial Monarch 11/19/2013,1:11 PM   Sonia Baller, MA, CCC-SLP [facility] Speech-Language Pathologist

## 2013-11-19 NOTE — Progress Notes (Signed)
[facility] PROGRESS NOTE  [name] [id number] DOB: [birth year] DOA: 11/17/2013 PCP: Reginia Naas, MD  Assessment/Plan: 1. Acute encephalopathy. I suspect medication induced as her symptoms apparently started after taking baclofen and benzodiazepine therapy. All psychotropic medications have been held. Improving this morning as she was arousable, interacted with her husband and myself who was present at bedside. 2. Advanced Parkinson's disease. Will continue carbidopa/levodopa. Because of worsening hallucinations amantadine and Mirapex were discontinued on a recent hospitalization. 3. Stage IV chronic kidney disease. Lab work during this hospitalization showing stable kidney function 4. Type 2 diabetes mellitus, diet controlled. 5. Diet. Patient presently n.p.o.  Goals of care. 30 minutes discussion regarding goals of care held with husband present at bedside. We discussed the philosophy of palliative care and minimizing burdensome interventions, including repeated hospitalizations. Patient is under the care of hospice, with a focus on managing symptoms and providing comfort. He explained that he would like to see her little more interactive, and do not want her to be sedated all the time. I offered to work on decreasing pychotropic meds while remaining vigilant to symptomatic management.   Code Status: DO NOT RESUSCITATE Disposition Plan: Patient improved today, plan to advance her diet per speech path recommendations. If she continues to improve hopefully can be discharged in the next 24 hours.   HPI/Subjective: Patient is a pleasant 80 year old female with a past medical history of advanced Parkinson's disease, who was recently admitted to [facility] hospital on 10/16/2013 presenting with confusion, agitation, hallucinations attributed to medications. During that hospitalization amantadine and Mirapex were discontinued and she was continued on carbidopa/levodopa. It was  felt that the dopamine agonist or contributing to patient's hallucinations. She was discharged to skilled nursing facility, recently discharged from SNF to home. She was started on baclofen on 11/16/2013 for leg cramps, after which she became increasingly somnolent. She had also been taking Valium during this time. Patient was brought to the emergency department on 11/18/2013 with somnolence, difficulties arousing, having a significant functional decline.   She is improving this morning as she is more awake, interacted with her husband and myself. Speech pathology saw her this morning, recommending dysphasia 1 with nectar thick liquids. Plan to advance her diet today.  Objective: Filed Vitals:   11/19/13 0418  BP: 167/74  Pulse: 60  Temp: 97.6 F (36.4 C)  Resp: 17    Intake/Output Summary (Last 24 hours) at 11/19/13 1327 Last data filed at 11/19/13 0900  Gross per 24 hour  Intake   1635 ml  Output      0 ml  Net   1635 ml   Filed Weights   11/18/13 0430  Weight: 44.1 kg (97 lb 3.6 oz)    Exam:   General:  Patient is arousable, interactive, making eye contact and following simple commands  Cardiovascular: Regular rate rhythm normal S1-S2  Respiratory: Normal respiratory effort, clear to auscultation bilaterally no wheezing rhonchi or  Abdomen: Soft nontender nondistended  Musculoskeletal: No extremity edema  Data Reviewed: Basic Metabolic Panel:  Recent Labs Lab 11/17/13 2339 11/18/13 0610  NA 144 145  K 4.7 4.7  CL 105 108  CO2 24 23  GLUCOSE 90 81  BUN 36* 33*  CREATININE 1.14* 1.08  CALCIUM 9.8 9.3   Liver Function Tests:  Recent Labs Lab 11/18/13 0610  AST 21  ALT 19  ALKPHOS 87  BILITOT 0.3  PROT 6.1  ALBUMIN 3.0*   No results found for this basename: LIPASE, AMYLASE,  in the last 168 hours No results found for this basename: AMMONIA,  in the last 168 hours CBC:  Recent Labs Lab 11/17/13 2339  WBC 7.5  NEUTROABS 5.3  HGB 11.7*  HCT  35.8*  MCV 88.0  PLT 328   Cardiac Enzymes: No results found for this basename: CKTOTAL, CKMB, CKMBINDEX, TROPONINI,  in the last 168 hours BNP (last 3 results)  Recent Labs  11/20/12 1548 09/05/13 0908  PROBNP 2104.0* 1649.0*   CBG:  Recent Labs Lab 11/17/13 2326  GLUCAP 89    Recent Results (from the past 240 hour(s))  URINE CULTURE     Status: None   Collection Time    11/17/13 11:38 PM      Result Value Range Status   Specimen Description URINE, CATHETERIZED   Final   Special Requests CX ADDED AT 0004 ON [id number]   Final   Culture  Setup Time     Final   Value: 11/18/2013 01:17     Performed at [facility] PENDING   Incomplete   Culture     Final   Value: Culture reincubated for better growth     Performed at [facility]   Report Status PENDING   Incomplete  MRSA PCR SCREENING     Status: Abnormal   Collection Time    11/18/13  9:27 AM      Result Value Range Status   MRSA by PCR POSITIVE (*) NEGATIVE Final   Comment:            The GeneXpert MRSA Assay (FDA     approved for NASAL specimens     only), is one component of a     comprehensive MRSA colonization     surveillance program. It is not     intended to diagnose MRSA     infection nor to guide or     monitor treatment for     MRSA infections.     RESULT CALLED TO, READ BACK BY AND VERIFIED WITH:     PHAM RN 12:15 11/18/13 ([email or web address])     Studies: Ct Head Wo Contrast  11/18/2013   CLINICAL DATA:  Altered mental status  EXAM: CT HEAD WITHOUT CONTRAST  TECHNIQUE: Contiguous axial images were obtained from the base of the skull through the vertex without intravenous contrast.  COMPARISON:  Prior CT from 10/16/2013  FINDINGS: Advanced age-related atrophy with chronic microvascular ischemic changes is stable as compared to the prior exam. Prominent atherosclerotic calcifications noted within the carotid siphon on cyst and distal vertebral arteries.  There is no acute  intracranial hemorrhage or infarct. No mass lesion or midline shift. Gray-white matter differentiation is well maintained. Ventricles are normal in size without evidence of hydrocephalus. No extra-axial fluid collection.  The calvarium is intact.  Orbital soft tissues are within normal limits.  The paranasal sinuses and mastoid air cells are well pneumatized and free of fluid.Scalp soft tissues are unremarkable.  IMPRESSION: 1. No acute intracranial abnormality. 2. Chronic involutional changes, unchanged.   Electronically Signed   By: Jeannine Boga M.D.   On: 11/18/2013 01:05   Dg Chest Port 1 View  11/18/2013   CLINICAL DATA:  Grunting respirations.  Somnolence.  EXAM: PORTABLE CHEST - 1 VIEW  COMPARISON:  Portable examination 10/16/2013. One view chest x-ray 09/05/2013. Two-view chest x-ray 04/21/2012.  FINDINGS: Markedly suboptimal inspiration. Blunting of the left costophrenic angle likely representing a left pleural effusion. Mild atelectasis in  the left base without confluent airspace consolidation. Lungs otherwise clear. Pulmonary vascularity normal. No pneumothorax. Cardiac silhouette enlarged but stable.  IMPRESSION: 1. Suboptimal inspiration. Left pleural effusion and associated mild passive atelectasis in the left lower lobe. 2. Stable cardiomegaly without pulmonary edema.   Electronically Signed   By: Evangeline Dakin M.D.   On: 11/18/2013 01:05    Scheduled Meds: . amLODipine  10 mg Oral Daily  . aspirin  81 mg Oral q morning - 10a  . carbidopa-levodopa  1 tablet Oral TID  . Chlorhexidine Gluconate Cloth  6 each Topical Q0600  . docusate sodium  100 mg Oral BID  . heparin  5,000 Units Subcutaneous Q8H  . mupirocin ointment  1 application Nasal BID  . pantoprazole (PROTONIX) IV  40 mg Intravenous Q12H  . sodium chloride  3 mL Intravenous Q12H   Continuous Infusions: . dextrose 5 % and 0.45% NaCl 75 mL/hr at 11/18/13 2152    Active Problems:   Acute encephalopathy    Time  spent: 45 minutes which included family discussion    Kelvin Cellar  [facility] Pager [phone]. If 7PM-7AM, please contact night-coverage at [email or web address], password [facility] 11/19/2013, 1:27 PM  LOS: 2 days

## 2013-11-20 DIAGNOSIS — R5383 Other fatigue: Secondary | ICD-10-CM

## 2013-11-20 DIAGNOSIS — R5381 Other malaise: Secondary | ICD-10-CM

## 2013-11-20 DIAGNOSIS — R269 Unspecified abnormalities of gait and mobility: Secondary | ICD-10-CM

## 2013-11-20 LAB — URINE CULTURE: Colony Count: >=100000

## 2013-11-20 NOTE — Progress Notes (Signed)
GIP Visit [place] [facility] [place] RN   Admission for [employer].Related to hospice diagnosis Parkinsons Disease. Patient is a DNR. Patient was alert and pleasantly confused. Patient is up in the recliner with nurse feeding her patient ate 100%. Patient is able to hold a conversation and make needs known with some confusion. Nurse states that possible discharge tomorrow. No family as bedside at this time. Patient denies any pain at this time.   Please call [facility] @ [phone]- ask for on-call RN with any hospice needs.  Thank you ,Ardath Sax , RN

## 2013-11-20 NOTE — Progress Notes (Signed)
[facility] PROGRESS NOTE  [name] DUK:[id number] DOB: 01-Aug-1928 DOA: 11/17/2013 PCP: Reginia Naas, MD  Assessment/Plan: 1. Acute encephalopathy. I suspect medication induced as her symptoms apparently started after taking baclofen and benzodiazepine therapy. All psychotropic medications have been held. Significant improvement today. She was assisted out of bed to chair. 2. Advanced Parkinson's disease. Will continue carbidopa/levodopa. Because of worsening hallucinations amantadine and Mirapex were discontinued on a recent hospitalization. 3. Stage IV chronic kidney disease. Lab work during this hospitalization showing stable kidney function 4. Type 2 diabetes mellitus, diet controlled. 5. Diet. Patient presently n.p.o.  Goals of care. 30 minutes discussion regarding goals of care held with husband present at bedside. We discussed the philosophy of palliative care and minimizing burdensome interventions, including repeated hospitalizations. Patient is under the care of hospice, with a focus on managing symptoms and providing comfort. He explained that he would like to see her little more interactive, and do not want her to be sedated all the time. I offered to work on decreasing pychotropic meds while remaining vigilant to symptomatic management.   Code Status: DO NOT RESUSCITATE Disposition Plan: Encephalopathy resolving, I think she is near her baseline, will likely be able to be discharged in the next 24 hours.    HPI/Subjective: Patient is a pleasant 80 year old female with a past medical history of advanced Parkinson's disease, who was recently admitted to [facility] long hospital on 10/16/2013 presenting with confusion, agitation, hallucinations attributed to medications. During that hospitalization amantadine and Mirapex were discontinued and she was continued on carbidopa/levodopa. It was felt that the dopamine agonist or contributing to patient's hallucinations. She was  discharged to skilled nursing facility, recently discharged from SNF to home. She was started on baclofen on 11/16/2013 for leg cramps, after which she became increasingly somnolent. She had also been taking Valium during this time. Patient was brought to the emergency department on 11/18/2013 with somnolence, difficulties arousing, having a significant functional decline.   Patient is awake alert this morning, was assisted out of bed to bedside chair  Objective: Filed Vitals:   11/20/13 0544  BP: 117/54  Pulse: 67  Temp: 97.1 F (36.2 C)  Resp: 19    Intake/Output Summary (Last 24 hours) at 11/20/13 1202 Last data filed at 11/20/13 0859  Gross per 24 hour  Intake    120 ml  Output    500 ml  Net   -380 ml   Filed Weights   11/18/13 0430  Weight: 44.1 kg (97 lb 3.6 oz)    Exam:   General:  Patient is arousable, interactive, making eye contact and following simple commands  Cardiovascular: Regular rate rhythm normal S1-S2  Respiratory: Normal respiratory effort, clear to auscultation bilaterally no wheezing rhonchi or  Abdomen: Soft nontender nondistended  Musculoskeletal: No extremity edema  Data Reviewed: Basic Metabolic Panel:  Recent Labs Lab 11/17/13 2339 11/18/13 0610  NA 144 145  K 4.7 4.7  CL 105 108  CO2 24 23  GLUCOSE 90 81  BUN 36* 33*  CREATININE 1.14* 1.08  CALCIUM 9.8 9.3   Liver Function Tests:  Recent Labs Lab 11/18/13 0610  AST 21  ALT 19  ALKPHOS 87  BILITOT 0.3  PROT 6.1  ALBUMIN 3.0*   No results found for this basename: LIPASE, AMYLASE,  in the last 168 hours No results found for this basename: AMMONIA,  in the last 168 hours CBC:  Recent Labs Lab 11/17/13 2339  WBC 7.5  NEUTROABS 5.3  HGB  11.7*  HCT 35.8*  MCV 88.0  PLT 328   Cardiac Enzymes: No results found for this basename: CKTOTAL, CKMB, CKMBINDEX, TROPONINI,  in the last 168 hours BNP (last 3 results)  Recent Labs  11/20/12 1548 09/05/13 0908  PROBNP  2104.0* 1649.0*   CBG:  Recent Labs Lab 11/17/13 2326  GLUCAP 89    Recent Results (from the past 240 hour(s))  URINE CULTURE     Status: None   Collection Time    11/17/13 11:38 PM      Result Value Range Status   Specimen Description URINE, CATHETERIZED   Final   Special Requests CX ADDED AT 0004 ON [id number]   Final   Culture  Setup Time     Final   Value: 11/18/2013 01:17     Performed at [facility]     Final   Value: >=100,000 COLONIES/ML     Performed at [facility]   Culture     Final   Value: ENTEROCOCCUS SPECIES     Performed at [facility]   Report Status 11/20/2013 FINAL   Final   Organism ID, Bacteria ENTEROCOCCUS SPECIES   Final  MRSA PCR SCREENING     Status: Abnormal   Collection Time    11/18/13  9:27 AM      Result Value Range Status   MRSA by PCR POSITIVE (*) NEGATIVE Final   Comment:            The GeneXpert MRSA Assay (FDA     approved for NASAL specimens     only), is one component of a     comprehensive MRSA colonization     surveillance program. It is not     intended to diagnose MRSA     infection nor to guide or     monitor treatment for     MRSA infections.     RESULT CALLED TO, READ BACK BY AND VERIFIED WITH:     PHAM RN 12:15 11/18/13 ([email or web address])     Studies: No results found.  Scheduled Meds: . amLODipine  10 mg Oral Daily  . aspirin  81 mg Oral q morning - 10a  . carbidopa-levodopa  1 tablet Oral TID  . Chlorhexidine Gluconate Cloth  6 each Topical Q0600  . docusate sodium  100 mg Oral BID  . heparin  5,000 Units Subcutaneous Q8H  . mupirocin ointment  1 application Nasal BID  . pantoprazole (PROTONIX) IV  40 mg Intravenous Q12H  . sodium chloride  3 mL Intravenous Q12H   Continuous Infusions: . dextrose 5 % and 0.45% NaCl 75 mL/hr at 11/20/13 0242    Active Problems:   Acute encephalopathy    Time spent: 25 minutes     Kelvin Cellar  [facility] Pager [phone]. If  7PM-7AM, please contact night-coverage at [email or web address], password [facility] 11/20/2013, 12:02 PM  LOS: 3 days

## 2013-11-21 DIAGNOSIS — F411 Generalized anxiety disorder: Secondary | ICD-10-CM

## 2013-11-21 DIAGNOSIS — Z515 Encounter for palliative care: Secondary | ICD-10-CM

## 2013-11-21 DIAGNOSIS — W19XXXA Unspecified fall, initial encounter: Secondary | ICD-10-CM

## 2013-11-21 MED ORDER — PANTOPRAZOLE SODIUM 40 MG PO TBEC
40.0000 mg | DELAYED_RELEASE_TABLET | Freq: Two times a day (BID) | ORAL | Status: DC
  Administered 2013-11-21: 40 mg via ORAL
  Filled 2013-11-21: qty 1

## 2013-11-21 MED ORDER — IBUPROFEN 600 MG PO TABS
600.0000 mg | ORAL_TABLET | Freq: Once | ORAL | Status: DC
  Filled 2013-11-21: qty 1

## 2013-11-21 NOTE — Discharge Summary (Addendum)
Physician Discharge Summary  [name] WUJ:[id number] DOB: 1928/08/11 DOA: 11/17/2013  PCP: Allean Found, MD  Admit date: 11/17/2013 Discharge date: 11/21/2013  Time spent: 35 minutes  Recommendations for Outpatient Follow-up:  1. Please follow up on patient's comfort, hospice following.  2. Would recommend discontinuing baclofen and Valium, with continuation of Ativan as needed for anxiety  Discharge Diagnoses:  Active Problems:   Hospice care patient   Acute encephalopathy   Generalized weakness   Dehydration   HTN (hypertension)   Parkinson's disease   Protein-calorie malnutrition, severe   Discharge Condition: Stable  Diet recommendation: Mechanical soft with nectar thick liquids  Filed Weights   11/18/13 0430  Weight: 44.1 kg (97 lb 3.6 oz)    History of present illness:  80 year old female with a history of Parkinson's disease, hypertension, hyperlipidemia, and CAD presents with increasing somnolence x24 hours. The patient is unable to provide any history at this time due to her somnolence. All of this history is obtained from speaking with Dr. Norlene Campbell and patient's husband at bedside. The patient was recently discharged from [facility] after a stay for increasing agitation which was attributable to her medications and acute on chronic renal failure. She was discharged to [facility] for approximately 20 days after which the patient has returned home. The patient has been receiving Valium 5 mg at bedtime for sleep as well as Ativan 0.5 mg every 8 hours when necessary leg cramps. The patient appears to have been having worsening leg cramps. As a result, the patient was started on baclofen on 11/16/2013. After taking a dose that evening, the patient has been somnolent and difficult to wake up. The husband states that she also received her Valium that evening. There's been no reports of respiratory distress, vomiting, or seizure type activity. The patient has not had any of her  maintenance medications as result of her somnolence for the past 24 hours. EMS was activated as a result and recommended transfer to the hospital.  Hospital Course:  Patient is a pleasant 80 year old female with a past medical history of advanced Parkinson's disease, who was recently admitted to [facility] long hospital on 10/16/2013 presenting with confusion, agitation, hallucinations attributed to medications. During that hospitalization amantadine and Mirapex were discontinued and she was continued on carbidopa/levodopa. It was felt that the dopamine agonist or contributing to patient's hallucinations. She was discharged to skilled nursing facility, recently discharged from SNF to home. She was started on baclofen on 11/16/2013 for leg cramps, after which she became increasingly somnolent. She had also been taking Valium and Ativan during this time. Patient was brought to the emergency department on 11/18/2013 with somnolence, minimal responsiveness. All psychotropic medications were held on admission as, it was probable that polypharmacy and the addition of baclofen likely contributed to patient's oversedation. During this hospitalization she gradually improved, becoming more awake and alert, tolerating by mouth intake. Hospice services have also been following during this hospitalization. There was concerns for her husband's ability to care for her at home. It appears that her needs have increased and it's possible that she may be bedbound. She required two-person assist to get her out of bed to bedside chair. These issues were discussed with hospice. They recommended obtaining a gait belt, transfer board as well as a physical therapy consultation at home. Her husband would also bring home aide 5 days a week rather than 3 days a week. I discussed with hospice my recommendations for stopping baclofen and simplifying her regimen with just  one benzodiazepine which will be Ativan. Patient wasn't discharged to her  home with hospice services following on 11/21/2013.   Consultations:  Speech pathology  Case manager  Hospice  Discharge Exam: Filed Vitals:   11/21/13 1116  BP: 130/70  Pulse:   Temp:   Resp:     General: Patient is in no acute distress, she was arousable, tolerating by mouth intake Cardiovascular: Regular rate and rhythm normal S1-S2 Respiratory: She has bibasilar crackles, coarse respiratory sounds, few rhonchi, does not appear to be in respiratory distress, not requiring oxygen  Abdomen: Soft nontender nondistended Extremity: No edema  Discharge Instructions      Discharge Orders   Future Appointments Provider Department Dept Phone   01/03/2014 12:00 PM Huston Foley, MD [facility] [phone]   Future Orders Complete By Expires   Call MD for:  difficulty breathing, headache or visual disturbances  As directed    Call MD for:  extreme fatigue  As directed    Call MD for:  hives  As directed    Call MD for:  persistant dizziness or light-headedness  As directed    Call MD for:  persistant nausea and vomiting  As directed    Call MD for:  severe uncontrolled pain  As directed    Diet - low sodium heart healthy  As directed    Increase activity slowly  As directed        Medication List    STOP taking these medications       baclofen 10 MG tablet  Commonly known as:  LIORESAL     diazepam 5 MG tablet  Commonly known as:  VALIUM      TAKE these medications       amLODipine 10 MG tablet  Commonly known as:  NORVASC  Take 10 mg by mouth every morning.     aspirin 81 MG tablet  Take 81 mg by mouth every morning.     Biotin 5000 MCG Caps  Take 1 capsule by mouth 2 (two) times daily.     carbidopa-levodopa 25-100 MG per tablet  Commonly known as:  SINEMET IR  Take 1 tablet by mouth 3 (three) times daily.     Cholecalciferol 2000 UNITS Tabs  Take 1 tablet by mouth every morning.     docusate sodium 100 MG capsule  Commonly known  as:  COLACE  Take 100 mg by mouth 2 (two) times daily.     famotidine 20 MG tablet  Commonly known as:  PEPCID  Take 20 mg by mouth 2 (two) times daily.     Fish Oil 1000 MG Caps  Take 1,000 mg by mouth every evening.     furosemide 40 MG tablet  Commonly known as:  LASIX  Take 0.5 tablets (20 mg total) by mouth daily.     LORazepam 0.5 MG tablet  Commonly known as:  ATIVAN  Take 1 tablet (0.5 mg total) by mouth every 8 (eight) hours as needed for sleep.     OCUVITE PO  Take 1 tablet by mouth every evening.     pantoprazole 40 MG tablet  Commonly known as:  PROTONIX  Take 40 mg by mouth 2 (two) times daily.     senna 8.6 MG Tabs tablet  Commonly known as:  SENOKOT  Take 2 tablets by mouth daily as needed for mild constipation.       Allergies  Allergen Reactions  . Mirtazapine Other (See Comments)  confusion  . Codeine Hives  . Latex Rash    Use cotton dressings   Follow-up Information   Follow up with Allean Found, MD In 2 weeks.   Specialty:  Family Medicine   Contact information:   [address], [address] [place] [address] [phone]        The results of significant diagnostics from this hospitalization (including imaging, microbiology, ancillary and laboratory) are listed below for reference.    Significant Diagnostic Studies: Ct Head Wo Contrast  11/18/2013   CLINICAL DATA:  Altered mental status  EXAM: CT HEAD WITHOUT CONTRAST  TECHNIQUE: Contiguous axial images were obtained from the base of the skull through the vertex without intravenous contrast.  COMPARISON:  Prior CT from 10/16/2013  FINDINGS: Advanced age-related atrophy with chronic microvascular ischemic changes is stable as compared to the prior exam. Prominent atherosclerotic calcifications noted within the carotid siphon on cyst and distal vertebral arteries.  There is no acute intracranial hemorrhage or infarct. No mass lesion or midline shift. Gray-white matter  differentiation is well maintained. Ventricles are normal in size without evidence of hydrocephalus. No extra-axial fluid collection.  The calvarium is intact.  Orbital soft tissues are within normal limits.  The paranasal sinuses and mastoid air cells are well pneumatized and free of fluid.Scalp soft tissues are unremarkable.  IMPRESSION: 1. No acute intracranial abnormality. 2. Chronic involutional changes, unchanged.   Electronically Signed   By: Rise Mu M.D.   On: 11/18/2013 01:05   Dg Chest Port 1 View  11/18/2013   CLINICAL DATA:  Grunting respirations.  Somnolence.  EXAM: PORTABLE CHEST - 1 VIEW  COMPARISON:  Portable examination 10/16/2013. One view chest x-ray 09/05/2013. Two-view chest x-ray 04/21/2012.  FINDINGS: Markedly suboptimal inspiration. Blunting of the left costophrenic angle likely representing a left pleural effusion. Mild atelectasis in the left base without confluent airspace consolidation. Lungs otherwise clear. Pulmonary vascularity normal. No pneumothorax. Cardiac silhouette enlarged but stable.  IMPRESSION: 1. Suboptimal inspiration. Left pleural effusion and associated mild passive atelectasis in the left lower lobe. 2. Stable cardiomegaly without pulmonary edema.   Electronically Signed   By: Hulan Saas M.D.   On: 11/18/2013 01:05    Microbiology: Recent Results (from the past 240 hour(s))  URINE CULTURE     Status: None   Collection Time    11/17/13 11:38 PM      Result Value Range Status   Specimen Description URINE, CATHETERIZED   Final   Special Requests CX ADDED AT 0004 ON [id number]   Final   Culture  Setup Time     Final   Value: 11/18/2013 01:17     Performed at [facility]   Colony Count     Final   Value: >=100,000 COLONIES/ML     Performed at [facility]   Culture     Final   Value: ENTEROCOCCUS SPECIES     Performed at [facility]   Report Status 11/20/2013 FINAL   Final   Organism ID, Bacteria ENTEROCOCCUS  SPECIES   Final  MRSA PCR SCREENING     Status: Abnormal   Collection Time    11/18/13  9:27 AM      Result Value Range Status   MRSA by PCR POSITIVE (*) NEGATIVE Final   Comment:            The GeneXpert MRSA Assay (FDA     approved for NASAL specimens     only), is  one component of a     comprehensive MRSA colonization     surveillance program. It is not     intended to diagnose MRSA     infection nor to guide or     monitor treatment for     MRSA infections.     RESULT CALLED TO, READ BACK BY AND VERIFIED WITH:     PHAM RN 12:15 11/18/13 ([email or web address])     Labs: Basic Metabolic Panel:  Recent Labs Lab 11/17/13 2339 11/18/13 0610  NA 144 145  K 4.7 4.7  CL 105 108  CO2 24 23  GLUCOSE 90 81  BUN 36* 33*  CREATININE 1.14* 1.08  CALCIUM 9.8 9.3   Liver Function Tests:  Recent Labs Lab 11/18/13 0610  AST 21  ALT 19  ALKPHOS 87  BILITOT 0.3  PROT 6.1  ALBUMIN 3.0*   No results found for this basename: LIPASE, AMYLASE,  in the last 168 hours No results found for this basename: AMMONIA,  in the last 168 hours CBC:  Recent Labs Lab 11/17/13 2339  WBC 7.5  NEUTROABS 5.3  HGB 11.7*  HCT 35.8*  MCV 88.0  PLT 328   Cardiac Enzymes: No results found for this basename: CKTOTAL, CKMB, CKMBINDEX, TROPONINI,  in the last 168 hours BNP: BNP (last 3 results)  Recent Labs  09/05/13 0908  PROBNP 1649.0*   CBG:  Recent Labs Lab 11/17/13 2326  GLUCAP 89       Signed:  Aliviah Spain  [facility] 11/21/2013, 11:25 AM

## 2013-11-21 NOTE — Progress Notes (Signed)
Room [facility] RN Visit-R.Madalee Altmann RN  Related admission to [facility] diagnosis of Parkinson's.  Pt is DNR code.    Pt sleeping soundly but arousable, lying in bed, witouth complaints of pain or discomfort.    Husband present and Dr. Coralyn Pear was present during part of visit.Patient's home medication list is on shadow chart.   Pt to be discharged today home with continuing [facility] home care.  Husband has asked for PT eval in the home ([facility] was attempting to schedule prior to pt coming to hospital),  He has asked for transfer board and gait belt and volunteers scheduled every Saturday.  Staff RN, SW, RN Mgr aware of above.  Concerns are if pt has enough upper body strength to use the transfer board-discussed with husband.  Also asked husband if he realized pt may be bedbound at this time.  He had look of surprise and shock - stating he had not thought about that situation.  His hopes are pt may improve - but Dr. Coralyn Pear and this writer explained improvement may not happen and this is the new baseline for patient.   Husband states there are extra help in home 3 days per week for 4 hours each day and hopes to increase this assistance to 5 days per week.   Advised husband this Probation officer would contact [facility] SW and have her give him the details about respite care in a SNF available through [facility] periodically.   Please call [facility] @ [phone]- ask for RN Liaison or after hours,ask for on-call RN with any hospice needs.   Thank you.  Vista Lawman, RN  [facility]  Hospice Liaison  [phone])

## 2013-11-21 NOTE — Progress Notes (Signed)
Per RN, patient needs EMS transportation home. CSW spoke to patient's husband and confirmed home address. CSW then called [facility] and arranged transportation for 1PM. CSW signing off at this time.  Jeanette Caprice, MSW, [place]

## 2013-11-21 NOTE — Progress Notes (Signed)
Discharge instructions given to patient's husband. Assisted patient in getting dressed. Patient to bedside chair. Transfer board given to patient per Lindy. Belongings with patient's husband. [facility] to transport patient.Cindee Salt

## 2013-11-28 ENCOUNTER — Telehealth: Payer: Self-pay | Admitting: Neurology

## 2013-11-28 DIAGNOSIS — G2581 Restless legs syndrome: Secondary | ICD-10-CM

## 2013-11-28 NOTE — Telephone Encounter (Signed)
[facility] is faxing updated meds list for Dr Guadelupe Sabin review

## 2013-11-28 NOTE — Telephone Encounter (Signed)
[name] with Hospice called regarding Ms. [name].  She stated that they have faxed over a new med list for Dr. Rexene Alberts.  She also stated that they would like some guidance on what Ms. [name] can take for her jumpy, restless legs.  Both of the patient's legs are in a great deal of pain and cramping, mostly at night, and is causing her not to get much rest.    Also they have noticed increased confusion in the patient.  And [name] has noticed muscular (not cardiac related) chest pains when she does her PT exercises.  Please contact [name] at [phone].  Thank you.

## 2013-12-02 MED ORDER — GABAPENTIN 100 MG PO CAPS: 100.0000 mg | ORAL_CAPSULE | Freq: Every day | ORAL | Status: DC

## 2013-12-02 NOTE — Telephone Encounter (Signed)
Spoke with her hospice nurse(Patty) and she said that they had seen Dr Tamala Julian since telephone and he has prescribed hydrocodone to help with pain. She said that patient is having balance problems, sedation and dizziness now so they will hold off on gababentin for now.  Will call back if they decide to take that route

## 2013-12-02 NOTE — Telephone Encounter (Signed)
Please advise [name], that we can try a small dose of Gabapentin for her RLS. I would like to stay low. Therefore, I suggest 100 mg of gabapentin at night. Please have them watch for side effects including sedation, balance problems, dizziness. I will send prescription to her pharmacy listed.

## 2013-12-05 ENCOUNTER — Telehealth: Payer: Self-pay | Admitting: Neurology

## 2013-12-05 NOTE — Telephone Encounter (Signed)
PT NOW UNDER HOSPICE CARE @ [facility]--HAVING PSYCHOTIC PROBLEMS--DR. THERE PUT PT ON HALDOL TEMPORARILY FOR CALMING--CONCERNED WITH HER TAKING THIS MEDICATION AND PARKINSON'S--WOULD YOU BE CONCERNED? PLEASE CALL ASAP.

## 2013-12-05 NOTE — Telephone Encounter (Signed)
Message sent to Dr. Rexene Alberts for advice.  Please advise concerning medications.

## 2013-12-07 NOTE — Telephone Encounter (Signed)
Dr. Tomasa Hosteller with [facility] calling re: pt.  His cell # [phone]

## 2013-12-07 NOTE — Telephone Encounter (Signed)
I had a phone conversation with Dr. Tomasa Hosteller from hospice care regarding this patient. She was seen in the emergency room on 11/17/2013 do to increase in somnolence secondary to medication effect. She has been with hospice care after a referral from her primary care physician. She has been agitated at times and needed to be given Haldol to calm her down. Now she is on Seroquel 25 mg every 6 as needed. She is taking it less than that. She is at times quite lucid and sometimes she has hallucinations and agitation. The original plan was for her to stay at [facility] and then referral for long-term placement. However her husband would like to have her back at home. We mutually decided to gradually reduce her Seroquel. She will be placed on 12.5 mg every 8 hours as needed and upon discharge she will be on 12.5 mg twice daily as needed. We will not change any of her other medications. She has appointment with me pending for 01/03/2014 at 12 PM and can keep that appointment. We also mutually agreed that she is not currently appropriate for hospice care as she is not terminally ill. She will have fluctuations in lucidity and will probably progress with hallucinations. Unfortunately there is several problems going on including frailty, advanced age, advanced Parkinson's, musculoskeletal issues, gait disorder and she has previously fallen with injuries. She is at great fall risk. Nevertheless, the plan is to discharge her to home as per family's request.

## 2013-12-13 ENCOUNTER — Telehealth: Payer: Self-pay | Admitting: *Deleted

## 2013-12-13 NOTE — Telephone Encounter (Signed)
Per Dr. Rexene Alberts will address power wheelchair next appt.

## 2014-01-03 ENCOUNTER — Ambulatory Visit (INDEPENDENT_AMBULATORY_CARE_PROVIDER_SITE_OTHER): Payer: Medicare Other | Admitting: Neurology

## 2014-01-03 ENCOUNTER — Encounter: Payer: Self-pay | Admitting: Neurology

## 2014-01-03 VITALS — BP 157/85 | HR 66 | Temp 98.5°F

## 2014-01-03 DIAGNOSIS — R441 Visual hallucinations: Secondary | ICD-10-CM

## 2014-01-03 DIAGNOSIS — G20 Parkinson's disease: Secondary | ICD-10-CM

## 2014-01-03 DIAGNOSIS — R269 Unspecified abnormalities of gait and mobility: Secondary | ICD-10-CM

## 2014-01-03 DIAGNOSIS — R296 Repeated falls: Secondary | ICD-10-CM

## 2014-01-03 DIAGNOSIS — Z9181 History of falling: Secondary | ICD-10-CM | POA: Diagnosis not present

## 2014-01-03 DIAGNOSIS — G20A1 Parkinson's disease without dyskinesia, without mention of fluctuations: Secondary | ICD-10-CM | POA: Diagnosis not present

## 2014-01-03 DIAGNOSIS — H5316 Psychophysical visual disturbances: Secondary | ICD-10-CM | POA: Diagnosis not present

## 2014-01-03 DIAGNOSIS — R011 Cardiac murmur, unspecified: Secondary | ICD-10-CM

## 2014-01-03 DIAGNOSIS — R413 Other amnesia: Secondary | ICD-10-CM | POA: Diagnosis not present

## 2014-01-03 DIAGNOSIS — F22 Delusional disorders: Secondary | ICD-10-CM

## 2014-01-03 NOTE — Patient Instructions (Addendum)
We will continue Sinemet at the current dose of 1 pill three times a day, at 8 AM, 2 PM, and 8 PM,  We will have you evaluated by PT at [facility], We will use as little Seroquel as possible to help with hallucinations, agitation, and sleep, such as 25 mg: 1/2 pill up to twice daily.  Follow up in 3 months with me.

## 2014-01-03 NOTE — Progress Notes (Signed)
Subjective:    Patient ID: [name] is a 80 y.o. female.  HPI    Interim history:  Ms. [name] is a very pleasant 80 year old right-handed woman with an underlying medical history of spinal stenosis, severe kyphoscoliosis, heart disease (Hx of MI), arthritis, bladder cancer, hypertension, hyperlipidemia, diabetes, peptic ulcer disease, adrenal insufficiency, anxiety, fibromyalgia, recurrent UTIs and recurrent falls, s/p tonsillectomy, appendectomy and abdominal hysterectomy, who presents for followup consultation of her advanced, left-sided predominant Parkinson's disease, complicated by severe posture changes, gait d/o, recurrent falls with injuries, memory loss, and in hallucinations and delusions and recent admission to the hospital with acute encephalopathy in the context of dehydration with acute kidney failure, and more recent admission in January of this year with somnolence. She is accompanied by her husband again today. I last saw her on 11/24/2012, at which time she presented for a sooner than scheduled appointment because of her recent admission.  In the interim, on 12/05/13, I had a phone conversation with Dr. Tomasa Hosteller from hospice care regarding the patient. She was seen in the emergency room on 11/17/2013 d/t increase in somnolence secondary to medication effect, due to Mirtazapine. She had been with hospice care after a referral from her primary care physician, which started on 11/10/13. She has been agitated at times and needed to be given Haldol to calm her down. She was then switched to Seroquel 25 mg every 6 as needed, but was taking it less than that. She had been at times quite lucid and sometimes she had hallucinations and agitation. She was at [facility] from 12/04/13 to 12/15/13 and was discharged to [facility] and discharged on 01/03/14. We decided to gradually reduce her Seroquel, 12.5 mg every 8 hours as needed and upon discharge 12.5 mg twice daily as needed. We did not  change any of her other medications. We also mutually agreed that she was not appropriate for hospice care. She will have fluctuations in lucidity and will probably progress with hallucinations. Unfortunately, there are several problems going on: frailty, advanced age, advanced Parkinson's, musculoskeletal issues, gait disorder with previous falls with injuries. She is at great fall risk.   Today, he reports, that she will check into Memory Care at [facility] today. She is only on C/L tid. She did not like to be at [facility]. Her husband endorses, that some of the staff may have been abrupt and inconsiderate. She reports, that she felt tense, but she did not complain to the nurse or management. She felt, that staff at times was mean. Her husband had not witnessed it, though. Hospice will get involved again per husband. She is in the [facility] of the time.   Her sleep medicine was changed to Remeron recently. She was weaned off of amantadine and Mirapex. In 1/15, she was dehydrated and received IVF. They felt her acute encephalopathy was multifactorial in nature d/t dehydration, acute renal failure, medication changes and Parkinson's medications and advanced PD.  I saw her on 10/11/2013 after a fall. At that visit I suggested that she use her walker at all times and also her wheelchair. I asked her to taper off amantadine at the time. I suggested evaluation for motorized wheelchair. I continued her on Mirapex. From the hospital she was discharged to a skilled nursing facility on 10/21/2013, [facility].  She had a CTH wo contrast on 10/16/13: Chronic and involutional changes without evidence of acute abnormalities. I previously reviewed the images through the PACS system.  I saw her on 08/25/2013, and which time I suggested the same dose of Sinemet. I felt she had a multifactorial gait disorder, a function of advancing age, severe kyphoscoliosis, weakness, CHF, and advanced PD with freezing and OH. In  the interim, she fell on 09/05/2013 and this resulted in a hip hematoma but no new fractures. She had imaging testing done. She had a hospital stay from 09/05/2013 through 09/07/2013. She was advised to reduce sedating medications. After her last visit I changed her from Mirapex long-acting to immediate release Mirapex for cost. The dose remained the same. I also talked to them about getting additional help for day-to-day chores at the house.  She has fallen repeatedly in the past. She falls backwards and forward, with and without the walker. She had HH PT/OT/ST for about 6 weeks through [facility] and was told to not transfer or walk without the walker. She has forgotten to use the walker. She has become more weak in general. She has had more difficulty feeding herself.  I first met them on 01/20/2013 and she previously followed with Dr. Morene Antu for about 12 years. At the time of our first visit I suggested stopping the selegiline as she did not notice much in the way of improvement in her freezing after restarting it. I increased her Sinemet to one whole pill twice daily and continued her pramipexole and amantadine. She has had more freezing, some intermittent confusion, more fatigue, more daytime sleepiness, per husband. After taking the selegiline off, she may have had more freezing. She has had recurrent falls. She broke her L wrist and had a cast for 6 weeks. She hurt her ribs before.   Her Past Medical History Is Significant For: Past Medical History  Diagnosis Date  . MI, old   . Parkinson disease   . Scoliosis   . Arthritis   . Bladder cancer   . HTN (hypertension) 04/21/2012  . Hyperlipidemia 04/21/2012  . DM (diabetes mellitus) 04/21/2012  . CAD (coronary artery disease) 04/21/2012  . Barrett esophagus 04/21/2012  . PUD (peptic ulcer disease) 04/21/2012  . Adrenal insufficiency 04/21/2012  . Raynaud's disease 04/21/2012  . Anxiety 04/21/2012  . Fibromyalgia 04/21/2012  . Hx of bladder  cancer 04/21/2012  . Edema of lower extremity 04/21/2012    Chronic lower extremity edema  . Falls 04/21/2012  . Recurrent UTI 01/20/2013  . Encephalopathy 11/18/2013    Her Past Surgical History Is Significant For: Past Surgical History  Procedure Laterality Date  . Tonsillectomy    . Appendectomy    . Abdominal hysterectomy      Her Family History Is Significant For: Family History  Problem Relation Age of Onset  . Heart disease Mother   . Diabetes Mother   . Bladder Cancer Maternal Aunt     Her Social History Is Significant For: History   Social History  . Marital Status: Married    Spouse Name: N/A    Number of Children: N/A  . Years of Education: N/A   Social History Main Topics  . Smoking status: Former Smoker    Types: Cigarettes  . Smokeless tobacco: Never Used  . Alcohol Use: No  . Drug Use: No  . Sexual Activity: No   Other Topics Concern  . None   Social History Narrative  . None    Her Allergies Are:  Allergies  Allergen Reactions  . Mirtazapine Other (See Comments)    confusion  . Codeine Hives  .  Latex Rash    Use cotton dressings  :   Her Current Medications Are:  Outpatient Encounter Prescriptions as of 01/03/2014  Medication Sig  . amLODipine (NORVASC) 10 MG tablet Take 10 mg by mouth every morning.   Marland Kitchen aspirin 81 MG tablet Take 81 mg by mouth every morning.   . carbidopa-levodopa (SINEMET IR) 25-100 MG per tablet Take 1 tablet by mouth 3 (three) times daily.  . famotidine (PEPCID) 20 MG tablet Take 20 mg by mouth 2 (two) times daily.  . furosemide (LASIX) 40 MG tablet Take 0.5 tablets (20 mg total) by mouth daily.  Marland Kitchen LORazepam (ATIVAN) 0.5 MG tablet Take 1 tablet (0.5 mg total) by mouth every 8 (eight) hours as needed for sleep.  . pantoprazole (PROTONIX) 40 MG tablet Take 40 mg by mouth 2 (two) times daily.  Marland Kitchen senna (SENOKOT) 8.6 MG TABS tablet Take 2 tablets by mouth daily as needed for mild constipation.  . Biotin 5000 MCG CAPS Take  1 capsule by mouth 2 (two) times daily.  . Cholecalciferol 2000 UNITS TABS Take 1 tablet by mouth every morning.  . docusate sodium (COLACE) 100 MG capsule Take 100 mg by mouth 2 (two) times daily.  Marland Kitchen gabapentin (NEURONTIN) 100 MG capsule Take 1 capsule (100 mg total) by mouth at bedtime. For RLS  . Multiple Vitamins-Minerals (OCUVITE PO) Take 1 tablet by mouth every evening.  . Omega-3 Fatty Acids (FISH OIL) 1000 MG CAPS Take 1,000 mg by mouth every evening.  :  Review of Systems:  Out of a complete 14 point review of systems, all are reviewed and negative with the exception of these symptoms as listed below:   Review of Systems  Constitutional: Negative.   HENT: Negative.   Eyes: Negative.   Respiratory: Negative.   Cardiovascular: Negative.   Gastrointestinal: Positive for constipation.  Endocrine: Negative.   Genitourinary: Negative.   Musculoskeletal: Negative.   Skin: Negative.   Allergic/Immunologic: Negative.   Neurological:       Memory loss  Hematological: Negative.   Psychiatric/Behavioral: Positive for hallucinations, confusion, dysphoric mood and agitation. The patient is nervous/anxious and is hyperactive.     Objective:  Neurologic Exam  Physical Exam Physical Examination:   Filed Vitals:   01/03/14 1207  BP: 157/85  Pulse: 66  Temp: 98.5 F (36.9 C)    General Examination: The patient is a very pleasant 80 y.o. female in no acute distress. She is situated in a chair, but came in her WC. She is frail appearing. She has having trouble relating her symptoms.    HEENT: Normocephalic, atraumatic, pupils are equal, round and reactive to light and accommodation. Extraocular tracking is good without nystagmus noted. Gaze is conjugate. Normal smooth pursuit is noted. Hearing is grossly intact. Face is symmetric with mild facial masking noted. Speech is clear with no dysarthria noted. Voice is slightly soft. There is no lip, neck or jaw tremor. Neck is mildly rigid.  There are no carotid bruits on auscultation. Oropharynx exam reveals moderate mouth dryness. No significant airway crowding is noted. Mallampati is class II. Tongue protrudes centrally and palate elevates symmetrically.  Chest: is clear to auscultation without wheezing, rhonchi or crackles noted.  Heart: sounds are normal with a 2/6 systolic murmur, no rubs or gallops noted.  Abdomen: is soft, non-tender and non-distended with normal bowel sounds appreciated on auscultation.  Extremities: There is trace pitting edema in the distal lower extremities bilaterally.  Skin: is warm and dry with  no trophic changes noted.  Musculoskeletal: exam reveals significant kyphoscoliosis, uneven shoulder height. She is leaning to the L.  Neurologically: Mental status: The patient is awake, alert and oriented to self and doctor's office. Her Memory, attention, language and knowledge are impaired. There is no aphasia, agnosia, apraxia or anomia. There is moderate bradyphrenia.  Cranial nerves are as described above under HEENT exam.  Motor exam: thin bulk and global strength of 4/5. Reflexes are 1+ throughout. Fine motor skills are moderately impaired with finger taps and hand movements b/l, L worse than R. She has moderate to severe impairment of foot taps b/l, L worse than R. She has a slight postural and action tremor on the right only. Sensory exam is intact to light touch. I did not ask her to stand or walk for me today.      Assessment and Plan:  In summary, REYAH STREETER is a very pleasant 80 year old female with a history of advanced left-sided predominant Parkinson's disease, complicated by advanced age, frailty, deconditioning, recurrent UTIs, severe posture changes, gait d/o, recurrent falls, memory loss, hallucinations and delusions, depression, anxiety and recent admissions to the hospital with acute encephalopathy, or sedation from medication and also evidence of dehydration with acute kidney  failure, recent medication side effects She has been taken off of amantadine, Mirapex and Remeron. She continues on Sinemet 3 times a day. She was discharged from skilled nursing and is going to transition to memory care at [facility] today. She will also reestablish care with hospice. I would like for her to use Seroquel as little as possible. Sometimes it is a difficult decision to make when we have to find a balance between treating agitation, toning down hallucinations and delusions and using neuroleptic medications. Amongst the antipsychotic medication Seroquel seems to be tolerated better by the elderly at. Nevertheless, I would suggest no more than 12.5 mg up to twice daily for her. I did talk to her husband about the risks that neuroleptic medications. When they are used in the elderly population. I will see her back in 3 months.

## 2014-01-04 ENCOUNTER — Encounter (HOSPITAL_COMMUNITY): Payer: Medicare PPO | Admitting: Anesthesiology

## 2014-01-04 ENCOUNTER — Emergency Department (HOSPITAL_COMMUNITY): Payer: Medicare PPO

## 2014-01-04 ENCOUNTER — Encounter (HOSPITAL_COMMUNITY): Payer: Self-pay | Admitting: Emergency Medicine

## 2014-01-04 ENCOUNTER — Inpatient Hospital Stay (HOSPITAL_COMMUNITY): Payer: Medicare PPO

## 2014-01-04 ENCOUNTER — Encounter (HOSPITAL_COMMUNITY)
Admission: EM | Disposition: A | Discharge status: Skilled Nursing Facility | Payer: Self-pay | Source: Home / Self Care | Attending: Internal Medicine

## 2014-01-04 ENCOUNTER — Inpatient Hospital Stay (HOSPITAL_COMMUNITY)
Admission: EM | Admit: 2014-01-04 | Discharge: 2014-01-07 | DRG: 481 | Disposition: A | Discharge status: Skilled Nursing Facility | Payer: Medicare PPO | Attending: Internal Medicine | Admitting: Internal Medicine

## 2014-01-04 ENCOUNTER — Observation Stay (HOSPITAL_COMMUNITY): Payer: Medicare PPO

## 2014-01-04 ENCOUNTER — Observation Stay (HOSPITAL_COMMUNITY): Payer: Medicare PPO | Admitting: Anesthesiology

## 2014-01-04 DIAGNOSIS — F419 Anxiety disorder, unspecified: Secondary | ICD-10-CM

## 2014-01-04 DIAGNOSIS — Z8711 Personal history of peptic ulcer disease: Secondary | ICD-10-CM

## 2014-01-04 DIAGNOSIS — F0280 Dementia in other diseases classified elsewhere without behavioral disturbance: Secondary | ICD-10-CM | POA: Diagnosis present

## 2014-01-04 DIAGNOSIS — Z7982 Long term (current) use of aspirin: Secondary | ICD-10-CM

## 2014-01-04 DIAGNOSIS — I10 Essential (primary) hypertension: Secondary | ICD-10-CM | POA: Diagnosis present

## 2014-01-04 DIAGNOSIS — D62 Acute posthemorrhagic anemia: Secondary | ICD-10-CM | POA: Diagnosis not present

## 2014-01-04 DIAGNOSIS — E785 Hyperlipidemia, unspecified: Secondary | ICD-10-CM | POA: Diagnosis present

## 2014-01-04 DIAGNOSIS — M4120 Other idiopathic scoliosis, site unspecified: Secondary | ICD-10-CM | POA: Diagnosis present

## 2014-01-04 DIAGNOSIS — G3183 Dementia with Lewy bodies: Secondary | ICD-10-CM

## 2014-01-04 DIAGNOSIS — Z79899 Other long term (current) drug therapy: Secondary | ICD-10-CM

## 2014-01-04 DIAGNOSIS — Y92129 Unspecified place in nursing home as the place of occurrence of the external cause: Secondary | ICD-10-CM

## 2014-01-04 DIAGNOSIS — W19XXXA Unspecified fall, initial encounter: Secondary | ICD-10-CM | POA: Diagnosis present

## 2014-01-04 DIAGNOSIS — I7300 Raynaud's syndrome without gangrene: Secondary | ICD-10-CM | POA: Diagnosis present

## 2014-01-04 DIAGNOSIS — G20A1 Parkinson's disease without dyskinesia, without mention of fluctuations: Secondary | ICD-10-CM | POA: Diagnosis present

## 2014-01-04 DIAGNOSIS — F32A Depression, unspecified: Secondary | ICD-10-CM

## 2014-01-04 DIAGNOSIS — R339 Retention of urine, unspecified: Secondary | ICD-10-CM | POA: Diagnosis not present

## 2014-01-04 DIAGNOSIS — Z9089 Acquired absence of other organs: Secondary | ICD-10-CM

## 2014-01-04 DIAGNOSIS — Z8249 Family history of ischemic heart disease and other diseases of the circulatory system: Secondary | ICD-10-CM

## 2014-01-04 DIAGNOSIS — I2510 Atherosclerotic heart disease of native coronary artery without angina pectoris: Secondary | ICD-10-CM

## 2014-01-04 DIAGNOSIS — Z8551 Personal history of malignant neoplasm of bladder: Secondary | ICD-10-CM

## 2014-01-04 DIAGNOSIS — Z833 Family history of diabetes mellitus: Secondary | ICD-10-CM

## 2014-01-04 DIAGNOSIS — S72001A Fracture of unspecified part of neck of right femur, initial encounter for closed fracture: Secondary | ICD-10-CM | POA: Diagnosis present

## 2014-01-04 DIAGNOSIS — E119 Type 2 diabetes mellitus without complications: Secondary | ICD-10-CM | POA: Diagnosis present

## 2014-01-04 DIAGNOSIS — S72143A Displaced intertrochanteric fracture of unspecified femur, initial encounter for closed fracture: Principal | ICD-10-CM | POA: Diagnosis present

## 2014-01-04 DIAGNOSIS — K2270 Barrett's esophagus without dysplasia: Secondary | ICD-10-CM | POA: Diagnosis present

## 2014-01-04 DIAGNOSIS — Z9181 History of falling: Secondary | ICD-10-CM

## 2014-01-04 DIAGNOSIS — Z87891 Personal history of nicotine dependence: Secondary | ICD-10-CM

## 2014-01-04 DIAGNOSIS — E875 Hyperkalemia: Secondary | ICD-10-CM | POA: Diagnosis present

## 2014-01-04 DIAGNOSIS — F411 Generalized anxiety disorder: Secondary | ICD-10-CM | POA: Diagnosis present

## 2014-01-04 DIAGNOSIS — S72009A Fracture of unspecified part of neck of unspecified femur, initial encounter for closed fracture: Secondary | ICD-10-CM

## 2014-01-04 DIAGNOSIS — Z888 Allergy status to other drugs, medicaments and biological substances status: Secondary | ICD-10-CM

## 2014-01-04 DIAGNOSIS — F329 Major depressive disorder, single episode, unspecified: Secondary | ICD-10-CM

## 2014-01-04 DIAGNOSIS — Y9210 Unspecified residential institution as the place of occurrence of the external cause: Secondary | ICD-10-CM | POA: Diagnosis present

## 2014-01-04 DIAGNOSIS — E2740 Unspecified adrenocortical insufficiency: Secondary | ICD-10-CM | POA: Diagnosis present

## 2014-01-04 DIAGNOSIS — Z66 Do not resuscitate: Secondary | ICD-10-CM | POA: Diagnosis present

## 2014-01-04 DIAGNOSIS — K219 Gastro-esophageal reflux disease without esophagitis: Secondary | ICD-10-CM | POA: Diagnosis present

## 2014-01-04 DIAGNOSIS — G20 Parkinson's disease: Secondary | ICD-10-CM

## 2014-01-04 DIAGNOSIS — Z91040 Latex allergy status: Secondary | ICD-10-CM

## 2014-01-04 DIAGNOSIS — E2749 Other adrenocortical insufficiency: Secondary | ICD-10-CM | POA: Diagnosis present

## 2014-01-04 DIAGNOSIS — I252 Old myocardial infarction: Secondary | ICD-10-CM

## 2014-01-04 DIAGNOSIS — Z8052 Family history of malignant neoplasm of bladder: Secondary | ICD-10-CM

## 2014-01-04 HISTORY — PX: COMPRESSION HIP SCREW: SHX1386

## 2014-01-04 LAB — BASIC METABOLIC PANEL
BUN: 21 mg/dL (ref 6–23)
CALCIUM: 9.9 mg/dL (ref 8.4–10.5)
CO2: 24 mEq/L (ref 19–32)
Chloride: 101 mEq/L (ref 96–112)
Creatinine, Ser: 1.13 mg/dL — ABNORMAL HIGH (ref 0.50–1.10)
GFR calc Af Amer: 50 mL/min — ABNORMAL LOW (ref >90)
GFR calc non Af Amer: 43 mL/min — ABNORMAL LOW (ref >90)
GLUCOSE: 124 mg/dL — AB (ref 70–99)
Potassium: 4.6 mEq/L (ref 3.7–5.3)
Sodium: 139 mEq/L (ref 137–147)

## 2014-01-04 LAB — URINALYSIS, ROUTINE W REFLEX MICROSCOPIC
BILIRUBIN URINE: NEGATIVE
GLUCOSE, UA: NEGATIVE mg/dL
HGB URINE DIPSTICK: NEGATIVE
Ketones, ur: NEGATIVE mg/dL
Leukocytes, UA: NEGATIVE
Nitrite: NEGATIVE
PROTEIN: NEGATIVE mg/dL
Specific Gravity, Urine: 1.016 (ref 1.005–1.030)
UROBILINOGEN UA: 0.2 mg/dL (ref 0.0–1.0)
pH: 7 (ref 5.0–8.0)

## 2014-01-04 LAB — CBC WITH DIFFERENTIAL/PLATELET
Basophils Absolute: 0 10*3/uL (ref 0.0–0.1)
Basophils Relative: 0 % (ref 0–1)
EOS ABS: 0 10*3/uL (ref 0.0–0.7)
Eosinophils Relative: 0 % (ref 0–5)
HCT: 34.1 % — ABNORMAL LOW (ref 36.0–46.0)
HEMOGLOBIN: 11 g/dL — AB (ref 12.0–15.0)
LYMPHS ABS: 1.3 10*3/uL (ref 0.7–4.0)
Lymphocytes Relative: 8 % — ABNORMAL LOW (ref 12–46)
MCH: 28.1 pg (ref 26.0–34.0)
MCHC: 32.3 g/dL (ref 30.0–36.0)
MCV: 87.2 fL (ref 78.0–100.0)
MONO ABS: 1.6 10*3/uL — AB (ref 0.1–1.0)
MONOS PCT: 10 % (ref 3–12)
Neutro Abs: 13 10*3/uL — ABNORMAL HIGH (ref 1.7–7.7)
Neutrophils Relative %: 81 % — ABNORMAL HIGH (ref 43–77)
PLATELETS: 291 10*3/uL (ref 150–400)
RBC: 3.91 MIL/uL (ref 3.87–5.11)
RDW: 16.3 % — ABNORMAL HIGH (ref 11.5–15.5)
WBC: 16 10*3/uL — ABNORMAL HIGH (ref 4.0–10.5)

## 2014-01-04 LAB — PROTIME-INR
INR: 1.13 (ref 0.00–1.49)
Prothrombin Time: 14.3 seconds (ref 11.6–15.2)

## 2014-01-04 LAB — ABO/RH: ABO/RH(D): A POS

## 2014-01-04 LAB — GLUCOSE, CAPILLARY
GLUCOSE-CAPILLARY: 101 mg/dL — AB (ref 70–99)
GLUCOSE-CAPILLARY: 111 mg/dL — AB (ref 70–99)
Glucose-Capillary: 95 mg/dL (ref 70–99)

## 2014-01-04 LAB — APTT: aPTT: 27 seconds (ref 24–37)

## 2014-01-04 SURGERY — COMPRESSION HIP
Anesthesia: General | Site: Hip | Laterality: Right

## 2014-01-04 MED ORDER — LACTATED RINGERS IV SOLN
INTRAVENOUS | Status: DC
  Administered 2014-01-04: 50 mL/h via INTRAVENOUS

## 2014-01-04 MED ORDER — BUPIVACAINE-EPINEPHRINE 0.5% -1:200000 IJ SOLN
INTRAMUSCULAR | Status: DC | PRN
  Administered 2014-01-04: 10 mL

## 2014-01-04 MED ORDER — ACETAMINOPHEN 325 MG PO TABS
650.0000 mg | ORAL_TABLET | Freq: Four times a day (QID) | ORAL | Status: DC | PRN
  Administered 2014-01-06 – 2014-01-07 (×2): 650 mg via ORAL
  Filled 2014-01-04 (×2): qty 2

## 2014-01-04 MED ORDER — FENTANYL CITRATE 0.05 MG/ML IJ SOLN
INTRAMUSCULAR | Status: DC | PRN
  Administered 2014-01-04: 50 ug via INTRAVENOUS

## 2014-01-04 MED ORDER — FENTANYL CITRATE 0.05 MG/ML IJ SOLN
INTRAMUSCULAR | Status: AC
  Filled 2014-01-04: qty 5

## 2014-01-04 MED ORDER — METHOCARBAMOL 500 MG PO TABS: 500.0000 mg | ORAL_TABLET | Freq: Two times a day (BID) | ORAL | Status: DC

## 2014-01-04 MED ORDER — MORPHINE SULFATE 4 MG/ML IJ SOLN
4.0000 mg | INTRAMUSCULAR | Status: DC | PRN
  Administered 2014-01-04: 4 mg via INTRAVENOUS
  Filled 2014-01-04: qty 1

## 2014-01-04 MED ORDER — PROPOFOL 10 MG/ML IV BOLUS
INTRAVENOUS | Status: DC | PRN
  Administered 2014-01-04: 90 mg via INTRAVENOUS

## 2014-01-04 MED ORDER — ROCURONIUM BROMIDE 100 MG/10ML IV SOLN
INTRAVENOUS | Status: DC | PRN
  Administered 2014-01-04: 5 mg via INTRAVENOUS
  Administered 2014-01-04: 25 mg via INTRAVENOUS

## 2014-01-04 MED ORDER — KCL IN DEXTROSE-NACL 20-5-0.45 MEQ/L-%-% IV SOLN
INTRAVENOUS | Status: DC
  Administered 2014-01-04: 22:00:00 via INTRAVENOUS
  Filled 2014-01-04 (×3): qty 1000

## 2014-01-04 MED ORDER — LIDOCAINE HCL (CARDIAC) 20 MG/ML IV SOLN
INTRAVENOUS | Status: DC | PRN
  Administered 2014-01-04: 60 mg via INTRAVENOUS

## 2014-01-04 MED ORDER — PROPOFOL 10 MG/ML IV BOLUS
INTRAVENOUS | Status: AC
  Filled 2014-01-04: qty 20

## 2014-01-04 MED ORDER — MORPHINE SULFATE 4 MG/ML IJ SOLN
2.0000 mg | Freq: Once | INTRAMUSCULAR | Status: AC
  Administered 2014-01-04: 2 mg via INTRAVENOUS
  Filled 2014-01-04: qty 1

## 2014-01-04 MED ORDER — ACETAMINOPHEN 650 MG RE SUPP: 650.0000 mg | Freq: Four times a day (QID) | RECTAL | Status: DC | PRN

## 2014-01-04 MED ORDER — DOCUSATE SODIUM 100 MG PO CAPS
100.0000 mg | ORAL_CAPSULE | Freq: Two times a day (BID) | ORAL | Status: DC
  Administered 2014-01-04 – 2014-01-06 (×5): 100 mg via ORAL
  Filled 2014-01-04 (×8): qty 1

## 2014-01-04 MED ORDER — ONDANSETRON HCL 4 MG/2ML IJ SOLN
INTRAMUSCULAR | Status: DC | PRN
  Administered 2014-01-04: 4 mg via INTRAVENOUS

## 2014-01-04 MED ORDER — FLEET ENEMA 7-19 GM/118ML RE ENEM: 1.0000 | ENEMA | Freq: Once | RECTAL | Status: AC | PRN

## 2014-01-04 MED ORDER — ASPIRIN EC 325 MG PO TBEC
325.0000 mg | DELAYED_RELEASE_TABLET | Freq: Every day | ORAL | Status: DC
  Administered 2014-01-05 – 2014-01-06 (×2): 325 mg via ORAL
  Filled 2014-01-04 (×4): qty 1

## 2014-01-04 MED ORDER — FENTANYL CITRATE 0.05 MG/ML IJ SOLN
INTRAMUSCULAR | Status: AC
  Filled 2014-01-04: qty 2

## 2014-01-04 MED ORDER — METOCLOPRAMIDE HCL 10 MG PO TABS: 5.0000 mg | ORAL_TABLET | Freq: Three times a day (TID) | ORAL | Status: DC | PRN

## 2014-01-04 MED ORDER — BUPIVACAINE-EPINEPHRINE (PF) 0.5% -1:200000 IJ SOLN
INTRAMUSCULAR | Status: AC
  Filled 2014-01-04: qty 10

## 2014-01-04 MED ORDER — FENTANYL CITRATE 0.05 MG/ML IJ SOLN
25.0000 ug | INTRAMUSCULAR | Status: DC | PRN
  Administered 2014-01-04: 25 ug via INTRAVENOUS

## 2014-01-04 MED ORDER — CHLORHEXIDINE GLUCONATE 4 % EX LIQD
60.0000 mL | Freq: Once | CUTANEOUS | Status: DC
  Filled 2014-01-04: qty 60

## 2014-01-04 MED ORDER — GLYCOPYRROLATE 0.2 MG/ML IJ SOLN
INTRAMUSCULAR | Status: DC | PRN
  Administered 2014-01-04: .4 mg via INTRAVENOUS

## 2014-01-04 MED ORDER — 0.9 % SODIUM CHLORIDE (POUR BTL) OPTIME
TOPICAL | Status: DC | PRN
  Administered 2014-01-04: 1000 mL

## 2014-01-04 MED ORDER — MORPHINE SULFATE 4 MG/ML IJ SOLN
4.0000 mg | Freq: Once | INTRAMUSCULAR | Status: AC
  Administered 2014-01-04: 4 mg via INTRAVENOUS
  Filled 2014-01-04: qty 1

## 2014-01-04 MED ORDER — METOCLOPRAMIDE HCL 5 MG/ML IJ SOLN: 5.0000 mg | Freq: Three times a day (TID) | INTRAMUSCULAR | Status: DC | PRN

## 2014-01-04 MED ORDER — ASPIRIN EC 325 MG PO TBEC: 325.0000 mg | DELAYED_RELEASE_TABLET | Freq: Two times a day (BID) | ORAL | Status: AC

## 2014-01-04 MED ORDER — MORPHINE SULFATE 2 MG/ML IJ SOLN
0.5000 mg | INTRAMUSCULAR | Status: DC | PRN
  Administered 2014-01-05 – 2014-01-06 (×3): 0.5 mg via INTRAVENOUS
  Filled 2014-01-04 (×3): qty 1

## 2014-01-04 MED ORDER — ROCURONIUM BROMIDE 50 MG/5ML IV SOLN
INTRAVENOUS | Status: AC
  Filled 2014-01-04: qty 1

## 2014-01-04 MED ORDER — ONDANSETRON HCL 4 MG/2ML IJ SOLN: 4.0000 mg | Freq: Four times a day (QID) | INTRAMUSCULAR | Status: DC | PRN

## 2014-01-04 MED ORDER — METHOCARBAMOL 500 MG PO TABS: 500.0000 mg | ORAL_TABLET | Freq: Four times a day (QID) | ORAL | Status: DC | PRN

## 2014-01-04 MED ORDER — GLYCOPYRROLATE 0.2 MG/ML IJ SOLN
INTRAMUSCULAR | Status: AC
  Filled 2014-01-04: qty 2

## 2014-01-04 MED ORDER — PHENOL 1.4 % MT LIQD: 1.0000 | OROMUCOSAL | Status: DC | PRN

## 2014-01-04 MED ORDER — NEOSTIGMINE METHYLSULFATE 1 MG/ML IJ SOLN
INTRAMUSCULAR | Status: AC
  Filled 2014-01-04: qty 10

## 2014-01-04 MED ORDER — DEXTROSE 5 % IV SOLN
500.0000 mg | Freq: Four times a day (QID) | INTRAVENOUS | Status: DC | PRN
  Filled 2014-01-04: qty 5

## 2014-01-04 MED ORDER — SENNOSIDES-DOCUSATE SODIUM 8.6-50 MG PO TABS
1.0000 | ORAL_TABLET | Freq: Every evening | ORAL | Status: DC | PRN
  Filled 2014-01-04: qty 1

## 2014-01-04 MED ORDER — ONDANSETRON HCL 4 MG/2ML IJ SOLN
INTRAMUSCULAR | Status: AC
  Filled 2014-01-04: qty 2

## 2014-01-04 MED ORDER — PHENYLEPHRINE HCL 10 MG/ML IJ SOLN
10.0000 mg | INTRAVENOUS | Status: DC | PRN
  Administered 2014-01-04: 20 ug/min via INTRAVENOUS

## 2014-01-04 MED ORDER — ONDANSETRON HCL 4 MG PO TABS: 4.0000 mg | ORAL_TABLET | Freq: Four times a day (QID) | ORAL | Status: DC | PRN

## 2014-01-04 MED ORDER — LIDOCAINE HCL (CARDIAC) 20 MG/ML IV SOLN
INTRAVENOUS | Status: AC
  Filled 2014-01-04: qty 5

## 2014-01-04 MED ORDER — HYDROCODONE-ACETAMINOPHEN 5-325 MG PO TABS
1.0000 | ORAL_TABLET | Freq: Four times a day (QID) | ORAL | Status: DC | PRN
  Administered 2014-01-05 – 2014-01-07 (×3): 1 via ORAL
  Filled 2014-01-04: qty 2
  Filled 2014-01-04 (×3): qty 1

## 2014-01-04 MED ORDER — MENTHOL 3 MG MT LOZG: 1.0000 | LOZENGE | OROMUCOSAL | Status: DC | PRN

## 2014-01-04 MED ORDER — CEFAZOLIN SODIUM-DEXTROSE 2-3 GM-% IV SOLR
INTRAVENOUS | Status: AC
  Filled 2014-01-04: qty 50

## 2014-01-04 MED ORDER — CEFAZOLIN SODIUM-DEXTROSE 2-3 GM-% IV SOLR
2.0000 g | INTRAVENOUS | Status: AC
  Administered 2014-01-04: 2 g via INTRAVENOUS

## 2014-01-04 MED ORDER — ONDANSETRON HCL 4 MG/2ML IJ SOLN: 4.0000 mg | Freq: Once | INTRAMUSCULAR | Status: DC | PRN

## 2014-01-04 MED ORDER — METHOCARBAMOL 100 MG/ML IJ SOLN: 500.0000 mg | Freq: Four times a day (QID) | INTRAVENOUS | Status: DC | PRN

## 2014-01-04 MED ORDER — BISACODYL 5 MG PO TBEC: 5.0000 mg | DELAYED_RELEASE_TABLET | Freq: Every day | ORAL | Status: DC | PRN

## 2014-01-04 MED ORDER — HYDROCODONE-ACETAMINOPHEN 5-325 MG PO TABS: 1.0000 | ORAL_TABLET | Freq: Four times a day (QID) | ORAL | Status: AC | PRN

## 2014-01-04 MED ORDER — NEOSTIGMINE METHYLSULFATE 1 MG/ML IJ SOLN
INTRAMUSCULAR | Status: DC | PRN
  Administered 2014-01-04: 3 mg via INTRAVENOUS

## 2014-01-04 MED ORDER — MORPHINE SULFATE 2 MG/ML IJ SOLN: 0.5000 mg | INTRAMUSCULAR | Status: DC | PRN

## 2014-01-04 SURGICAL SUPPLY — 58 items
BANDAGE GAUZE ELAST BULKY 4 IN (GAUZE/BANDAGES/DRESSINGS) ×3 IMPLANT
BIT DRILL 3.8MM (BIT) ×1
BIT DRILL TWIST 3.8X7 (BIT) ×1 IMPLANT
BNDG COHESIVE 4X5 TAN STRL (GAUZE/BANDAGES/DRESSINGS) ×3 IMPLANT
BNDG COHESIVE 6X5 TAN STRL LF (GAUZE/BANDAGES/DRESSINGS) ×3 IMPLANT
COVER SURGICAL LIGHT HANDLE (MISCELLANEOUS) ×3 IMPLANT
DECANTER SPIKE VIAL GLASS SM (MISCELLANEOUS) ×3 IMPLANT
DRAPE C-ARM 42X72 X-RAY (DRAPES) ×3 IMPLANT
DRAPE INCISE IOBAN 66X45 STRL (DRAPES) ×3 IMPLANT
DRAPE ORTHO SPLIT 77X108 STRL (DRAPES) ×6
DRAPE PROXIMA HALF (DRAPES) ×12 IMPLANT
DRAPE SURG ORHT 6 SPLT 77X108 (DRAPES) ×2 IMPLANT
DRAPE U-SHAPE 47X51 STRL (DRAPES) ×3 IMPLANT
DRILL BIT 7/64X5 (BIT) IMPLANT
DRSG AQUACEL AG ADV 3.5X10 (GAUZE/BANDAGES/DRESSINGS) ×3 IMPLANT
DRSG MEPILEX BORDER 4X4 (GAUZE/BANDAGES/DRESSINGS) ×3 IMPLANT
DRSG MEPILEX BORDER 4X8 (GAUZE/BANDAGES/DRESSINGS) ×3 IMPLANT
DRSG PAD ABDOMINAL 8X10 ST (GAUZE/BANDAGES/DRESSINGS) ×3 IMPLANT
DURAPREP 26ML APPLICATOR (WOUND CARE) ×3 IMPLANT
ELECT CAUTERY BLADE 6.4 (BLADE) ×3 IMPLANT
ELECT REM PT RETURN 9FT ADLT (ELECTROSURGICAL) ×3
ELECTRODE REM PT RTRN 9FT ADLT (ELECTROSURGICAL) ×1 IMPLANT
EVACUATOR 1/8 PVC DRAIN (DRAIN) IMPLANT
GAUZE XEROFORM 5X9 LF (GAUZE/BANDAGES/DRESSINGS) ×3 IMPLANT
GLOVE BIO SURGEON STRL SZ7.5 (GLOVE) ×3 IMPLANT
GLOVE BIO SURGEON STRL SZ8.5 (GLOVE) ×3 IMPLANT
GLOVE BIOGEL PI IND STRL 8 (GLOVE) ×1 IMPLANT
GLOVE BIOGEL PI IND STRL 9 (GLOVE) ×1 IMPLANT
GLOVE BIOGEL PI INDICATOR 8 (GLOVE) ×2
GLOVE BIOGEL PI INDICATOR 9 (GLOVE) ×2
GOWN STRL NON-REIN LRG LVL3 (GOWN DISPOSABLE) ×9 IMPLANT
GOWN STRL REIN XL XLG (GOWN DISPOSABLE) ×3 IMPLANT
KIT BASIN OR (CUSTOM PROCEDURE TRAY) ×3 IMPLANT
KIT ROOM TURNOVER OR (KITS) ×3 IMPLANT
MANIFOLD NEPTUNE II (INSTRUMENTS) ×3 IMPLANT
NS IRRIG 1000ML POUR BTL (IV SOLUTION) ×3 IMPLANT
PACK GENERAL/GYN (CUSTOM PROCEDURE TRAY) ×3 IMPLANT
PAD ARMBOARD 7.5X6 YLW CONV (MISCELLANEOUS) ×6 IMPLANT
PIN THREADED GUIDE ACE (PIN) ×2 IMPLANT
PLATE COMP 140D 4H STD (Plate) ×2 IMPLANT
SCREW ACECAP 38MM (Screw) ×6 IMPLANT
SCREW ACECAP 40MM (Screw) ×2 IMPLANT
SCREW ACECAP 42MM (Screw) ×2 IMPLANT
SCREW LAG 75MM (Screw) ×3 IMPLANT
SCREW LAG REG STD THRD 75X22.2 (Screw) IMPLANT
STAPLER VISISTAT 35W (STAPLE) ×3 IMPLANT
STOCKINETTE IMPERVIOUS LG (DRAPES) ×3 IMPLANT
SUT ETHILON 3 0 FSL (SUTURE) ×3 IMPLANT
SUT VIC AB 0 CT1 27 (SUTURE) ×3
SUT VIC AB 0 CT1 27XBRD ANBCTR (SUTURE) ×1 IMPLANT
SUT VIC AB 1 CT1 27 (SUTURE) ×3
SUT VIC AB 1 CT1 27XBRD ANTBC (SUTURE) ×1 IMPLANT
SUT VIC AB 2-0 CTB1 (SUTURE) ×3 IMPLANT
SYR CONTROL 10ML LL (SYRINGE) ×3 IMPLANT
TAPE STRIPS DRAPE STRL (GAUZE/BANDAGES/DRESSINGS) ×3 IMPLANT
TOWEL OR 17X24 6PK STRL BLUE (TOWEL DISPOSABLE) ×3 IMPLANT
TOWEL OR 17X26 10 PK STRL BLUE (TOWEL DISPOSABLE) ×3 IMPLANT
WATER STERILE IRR 1000ML POUR (IV SOLUTION) ×3 IMPLANT

## 2014-01-04 NOTE — Transfer of Care (Signed)
Immediate Anesthesia Transfer of Care Note  Patient: [name]  Procedure(s) Performed: Procedure(s): COMPRESSION HIP- right  (Right)  Patient Location: PACU  Anesthesia Type:General  Level of Consciousness: awake and alert   Airway & Oxygen Therapy: Patient Spontanous Breathing and Patient connected to face mask oxygen  Post-op Assessment: Report given to PACU RN, Post -op Vital signs reviewed and stable and Patient moving all extremities  Post vital signs: Reviewed and stable  Complications: No apparent anesthesia complications

## 2014-01-04 NOTE — ED Notes (Signed)
Bed: WA20 Expected date: 01/04/14 Expected time: 1:52 AM Means of arrival: Ambulance Comments: Fall, leg pain

## 2014-01-04 NOTE — Anesthesia Postprocedure Evaluation (Signed)
Anesthesia Post Note  Patient: [name]  Procedure(s) Performed: Procedure(s) (LRB): COMPRESSION HIP- right  (Right)  Anesthesia type: General  Patient location: PACU  Post pain: Pain level controlled and Adequate analgesia  Post assessment: Post-op Vital signs reviewed, Patient's Cardiovascular Status Stable, Respiratory Function Stable, Patent Airway and Pain level controlled  Last Vitals:  Filed Vitals:   01/04/14 1800  BP: 119/71  Pulse: 79  Temp: 37.1 C  Resp: 23    Post vital signs: Reviewed and stable  Level of consciousness: awake, alert  and oriented  Complications: No apparent anesthesia complications

## 2014-01-04 NOTE — Preoperative (Signed)
Beta Blockers   Reason not to administer Beta Blockers:Not Applicable 

## 2014-01-04 NOTE — ED Provider Notes (Signed)
CSN: [id number]     Arrival date & time 01/04/14  0208 History   First MD Initiated Contact with Patient 01/04/14 [phone]     Chief Complaint  Patient presents with  . Hip Pain     (Consider location/radiation/quality/duration/timing/severity/associated sxs/prior Treatment) HPI History provided by pt.   Pt comes from [facility]  She reports that she was trying to get something out of dresser drawer, turned too quickly afterwards and fell, landing on her right side.  She did not experience lightheadedness, dizziness or CP prior to fall.  Did not hit head when she landed.  Denies neck and back pain.  Has throbbing, non-radiating, moderate pain in right hip only.  Unable to bear weight.  No associated paresthesias.  No recent infectious sx including fever, cough, N/V/D, dysuria.  Past Medical History  Diagnosis Date  . MI, old   . Parkinson disease   . Scoliosis   . Arthritis   . Bladder cancer   . HTN (hypertension) 04/21/2012  . Hyperlipidemia 04/21/2012  . DM (diabetes mellitus) 04/21/2012  . CAD (coronary artery disease) 04/21/2012  . Barrett esophagus 04/21/2012  . PUD (peptic ulcer disease) 04/21/2012  . Adrenal insufficiency 04/21/2012  . Raynaud's disease 04/21/2012  . Anxiety 04/21/2012  . Fibromyalgia 04/21/2012  . Hx of bladder cancer 04/21/2012  . Edema of lower extremity 04/21/2012    Chronic lower extremity edema  . Falls 04/21/2012  . Recurrent UTI 01/20/2013  . Encephalopathy 11/18/2013   Past Surgical History  Procedure Laterality Date  . Tonsillectomy    . Appendectomy    . Abdominal hysterectomy     Family History  Problem Relation Age of Onset  . Heart disease Mother   . Diabetes Mother   . Bladder Cancer Maternal Aunt    History  Substance Use Topics  . Smoking status: Former Smoker    Types: Cigarettes  . Smokeless tobacco: Never Used  . Alcohol Use: No   OB History   Grav Para Term Preterm Abortions TAB SAB Ect Mult Living                  Review of Systems  All other systems reviewed and are negative.      Allergies  Mirtazapine; Codeine; and Latex  Home Medications   Current Outpatient Rx  Name  Route  Sig  Dispense  Refill  . amLODipine (NORVASC) 10 MG tablet   Oral   Take 10 mg by mouth every morning.          Marland Kitchen aspirin 81 MG tablet   Oral   Take 81 mg by mouth every morning.          . bisacodyl (DULCOLAX) 10 MG suppository   Rectal   Place 10 mg rectally every 3 (three) days as needed for moderate constipation.         . carbidopa-levodopa (SINEMET IR) 25-100 MG per tablet   Oral   Take 1 tablet by mouth 3 (three) times daily.         . citalopram (CELEXA) 20 MG tablet   Oral   Take 20 mg by mouth daily.         . famotidine (PEPCID) 20 MG tablet   Oral   Take 20 mg by mouth 2 (two) times daily.         . furosemide (LASIX) 20 MG tablet   Oral   Take 20 mg by mouth daily.         Marland Kitchen  HYDROcodone-acetaminophen (NORCO/VICODIN) 5-325 MG per tablet   Oral   Take 2 tablets by mouth every 6 (six) hours as needed for moderate pain.         Marland Kitchen ibuprofen (ADVIL,MOTRIN) 200 MG tablet   Oral   Take 400 mg by mouth every 6 (six) hours as needed for fever, headache or moderate pain.         Marland Kitchen LORazepam (ATIVAN) 1 MG tablet   Oral   Take 1 mg by mouth every 4 (four) hours as needed for anxiety.         . magnesium oxide (MAG-OX) 400 MG tablet   Oral   Take 400 mg by mouth daily.         . pantoprazole (PROTONIX) 40 MG tablet   Oral   Take 40 mg by mouth 2 (two) times daily.         . QUEtiapine (SEROQUEL) 25 MG tablet   Oral   Take 12.5-25 mg by mouth daily. 12.5 am in the am and 25 mg in the pm         . senna-docusate (SENOKOT-S) 8.6-50 MG per tablet   Oral   Take 2 tablets by mouth 2 (two) times daily.          BP 168/80  Pulse 71  Temp(Src) 98 F (36.7 C) (Oral)  Resp 16  SpO2 93% Physical Exam  Nursing note and vitals reviewed. Constitutional: She  is oriented to person, place, and time. She appears well-developed and well-nourished. No distress.  HENT:  Head: Normocephalic and atraumatic.  Eyes:  Normal appearance  Neck: Normal range of motion.  Cardiovascular: Normal rate and regular rhythm.   Pulmonary/Chest: Effort normal and breath sounds normal. No respiratory distress.  Musculoskeletal: Normal range of motion.  Entire spine non-tender.  No pain w/ ROM neck.  Pelvis stable.  Shortening RLE.  Tenderness R hip.  2+ DP pulse and distal sensation intact.    Neurological: She is alert and oriented to person, place, and time.  Skin: Skin is warm and dry. No rash noted.  Psychiatric: She has a normal mood and affect. Her behavior is normal.    ED Course  Procedures (including critical care time) Labs Review Labs Reviewed  CBC WITH DIFFERENTIAL - Abnormal; Notable for the following:    WBC 16.0 (*)    Hemoglobin 11.0 (*)    HCT 34.1 (*)    RDW 16.3 (*)    Neutrophils Relative % 81 (*)    Neutro Abs 13.0 (*)    Lymphocytes Relative 8 (*)    Monocytes Absolute 1.6 (*)    All other components within normal limits  BASIC METABOLIC PANEL - Abnormal; Notable for the following:    Glucose, Bld 124 (*)    Creatinine, Ser 1.13 (*)    GFR calc non Af Amer 43 (*)    GFR calc Af Amer 50 (*)    All other components within normal limits  URINALYSIS, ROUTINE W REFLEX MICROSCOPIC   Imaging Review Dg Hip Complete Right  01/04/2014   CLINICAL DATA:  Fall with hip deformity  EXAM: RIGHT HIP - COMPLETE 2+ VIEW  COMPARISON:  None.  FINDINGS: Acute intertrochanteric right femur fracture with marked varus angulation. The femoral head remains located.  Limited evaluation the bony pelvis due to rightward rotation. There is callus around the inferior pubic ramus on the right compatible with remote fracture. No acute pelvic ring fracture detected. The left hip is located.  IMPRESSION: 1.  Acute, impacted intertrochanteric right femur fracture. 2.  Remote right obturator ring fracture.   Electronically Signed   By: Jorje Guild M.D.   On: 01/04/2014 03:08     EKG Interpretation None      MDM   Final diagnoses:  Fracture of right hip   80yo F from memory care unit at ALF presents w/ c/o R hip pain.  Pt reports fall onto R side this morning, though mechanism of injury is unclear.  She has had falls in the past.  Shortening of RLE and diffuse R hip ttp on exam.  No NV deficits.  Xray shows impacted intertrochanteric femur fx.  Pt had some relief of pain w/ morphine.  Dr. Mayer Camel consulted and will take patient to OR this afternoon if she is medically cleared for surgery.  [facility] consulted for admission.  Pt to be transferred to [facility].    Remer Macho, PA-C 01/04/14 [phone]

## 2014-01-04 NOTE — ED Notes (Signed)
Surgical physician called unit.  Pt to transfer now with his approval.  Bed not assigned at this time. Bed notified being cleaned.  Called report to pre-surgical.

## 2014-01-04 NOTE — Interval H&P Note (Signed)
History and Physical Interval Note:  01/04/2014 2:34 PM  [name]  has presented today for surgery, with the diagnosis of Right intertrochanter hip fracture  The various methods of treatment have been discussed with the patient and family. After consideration of risks, benefits and other options for treatment, the patient has consented to  Procedure(s) with comments: COMPRESSION HIP (Right) - Depuy TKP dynamic hip screw as a surgical intervention .  The patient's history has been reviewed, patient examined, no change in status, stable for surgery.  I have reviewed the patient's chart and labs.  Questions were answered to the patient's satisfaction.     Kerin Salen

## 2014-01-04 NOTE — ED Provider Notes (Signed)
Medical screening examination/treatment/procedure(s) were performed by non-physician practitioner and as supervising physician I was immediately available for consultation/collaboration.   Neta Ehlers, MD 01/04/14 2001

## 2014-01-04 NOTE — Anesthesia Preprocedure Evaluation (Addendum)
Anesthesia Evaluation  Patient identified by MRN, date of birth, ID band Patient awake    Reviewed: Allergy & Precautions, H&P , NPO status , Patient's Chart, lab work & pertinent test results  Airway Mallampati: II      Dental   Pulmonary shortness of breath, pneumonia -, former smoker,  breath sounds clear to auscultation        Cardiovascular hypertension, + CAD, + Past MI, + Peripheral Vascular Disease and +CHF Rhythm:Regular Rate:Normal     Neuro/Psych Anxiety Depression    GI/Hepatic hiatal hernia, PUD, GERD-  ,  Endo/Other  diabetes  Renal/GU Renal disease     Musculoskeletal  (+) Fibromyalgia -  Abdominal   Peds  Hematology   Anesthesia Other Findings   Reproductive/Obstetrics                          Anesthesia Physical Anesthesia Plan  ASA: III  Anesthesia Plan: General   Post-op Pain Management:    Induction: Intravenous  Airway Management Planned: Oral ETT  Additional Equipment:   Intra-op Plan:   Post-operative Plan: Extubation in OR  Informed Consent: I have reviewed the patients History and Physical, chart, labs and discussed the procedure including the risks, benefits and alternatives for the proposed anesthesia with the patient or authorized representative who has indicated his/her understanding and acceptance.   Dental advisory given  Plan Discussed with:   Anesthesia Plan Comments:         Anesthesia Quick Evaluation

## 2014-01-04 NOTE — Consult Note (Signed)
Reason for Consult: Right hip basicervical intertrochanteric fracture Referring Physician: Imanni Burdine is an 80 y.o. female.  HPI: Patient with moderate dementia resides in assisted living and fell last night sustaining a displaced right hip basicervical intertrochanteric fracture. She was transported was a long hospital and evaluated in the emergency department and orthopedic consultation was obtained. The size moderate dementia she also has Parkinson's disease hypertension diabetes adrenal insufficiency fibromyalgia recurrent falls and encephalopathy. She is normally a bed to wheelchair regarding ambulation although she does use a walker but only with a physical therapist.  Past Medical History  Diagnosis Date  . MI, old   . Parkinson disease   . Scoliosis   . Arthritis   . Bladder cancer   . HTN (hypertension) 04/21/2012  . Hyperlipidemia 04/21/2012  . DM (diabetes mellitus) 04/21/2012  . CAD (coronary artery disease) 04/21/2012  . Barrett esophagus 04/21/2012  . PUD (peptic ulcer disease) 04/21/2012  . Adrenal insufficiency 04/21/2012  . Raynaud's disease 04/21/2012  . Anxiety 04/21/2012  . Fibromyalgia 04/21/2012  . Hx of bladder cancer 04/21/2012  . Edema of lower extremity 04/21/2012    Chronic lower extremity edema  . Falls 04/21/2012  . Recurrent UTI 01/20/2013  . Encephalopathy 11/18/2013    Past Surgical History  Procedure Laterality Date  . Tonsillectomy    . Appendectomy    . Abdominal hysterectomy      Family History  Problem Relation Age of Onset  . Heart disease Mother   . Diabetes Mother   . Bladder Cancer Maternal Aunt     Social History:  reports that she has quit smoking. Her smoking use included Cigarettes. She smoked 0.00 packs per day. She has never used smokeless tobacco. She reports that she does not drink alcohol or use illicit drugs.  Allergies:  Allergies  Allergen Reactions  . Mirtazapine Other (See Comments)    confusion  . Codeine Hives   . Latex Rash    Use cotton dressings    Medications: I have reviewed the patient's current medications.  Results for orders placed during the hospital encounter of 01/04/14 (from the past 48 hour(s))  CBC WITH DIFFERENTIAL     Status: Abnormal   Collection Time    01/04/14  4:30 AM      Result Value Ref Range   WBC 16.0 (*) 4.0 - 10.5 K/uL   RBC 3.91  3.87 - 5.11 MIL/uL   Hemoglobin 11.0 (*) 12.0 - 15.0 g/dL   HCT 34.1 (*) 36.0 - 46.0 %   MCV 87.2  78.0 - 100.0 fL   MCH 28.1  26.0 - 34.0 pg   MCHC 32.3  30.0 - 36.0 g/dL   RDW 16.3 (*) 11.5 - 15.5 %   Platelets 291  150 - 400 K/uL   Neutrophils Relative % 81 (*) 43 - 77 %   Neutro Abs 13.0 (*) 1.7 - 7.7 K/uL   Lymphocytes Relative 8 (*) 12 - 46 %   Lymphs Abs 1.3  0.7 - 4.0 K/uL   Monocytes Relative 10  3 - 12 %   Monocytes Absolute 1.6 (*) 0.1 - 1.0 K/uL   Eosinophils Relative 0  0 - 5 %   Eosinophils Absolute 0.0  0.0 - 0.7 K/uL   Basophils Relative 0  0 - 1 %   Basophils Absolute 0.0  0.0 - 0.1 K/uL  BASIC METABOLIC PANEL     Status: Abnormal   Collection Time    01/04/14  4:30 AM      Result Value Ref Range   Sodium 139  137 - 147 mEq/L   Potassium 4.6  3.7 - 5.3 mEq/L   Chloride 101  96 - 112 mEq/L   CO2 24  19 - 32 mEq/L   Glucose, Bld 124 (*) 70 - 99 mg/dL   BUN 21  6 - 23 mg/dL   Creatinine, Ser 1.13 (*) 0.50 - 1.10 mg/dL   Calcium 9.9  8.4 - 10.5 mg/dL   GFR calc non Af Amer 43 (*) >90 mL/min   GFR calc Af Amer 50 (*) >90 mL/min   Comment: (NOTE)     The eGFR has been calculated using the CKD EPI equation.     This calculation has not been validated in all clinical situations.     eGFR's persistently <90 mL/min signify possible Chronic Kidney     Disease.  URINALYSIS, ROUTINE W REFLEX MICROSCOPIC     Status: None   Collection Time    01/04/14  5:57 AM      Result Value Ref Range   Color, Urine YELLOW  YELLOW   APPearance CLEAR  CLEAR   Specific Gravity, Urine 1.016  1.005 - 1.030   pH 7.0  5.0 - 8.0    Glucose, UA NEGATIVE  NEGATIVE mg/dL   Hgb urine dipstick NEGATIVE  NEGATIVE   Bilirubin Urine NEGATIVE  NEGATIVE   Ketones, ur NEGATIVE  NEGATIVE mg/dL   Protein, ur NEGATIVE  NEGATIVE mg/dL   Urobilinogen, UA 0.2  0.0 - 1.0 mg/dL   Nitrite NEGATIVE  NEGATIVE   Leukocytes, UA NEGATIVE  NEGATIVE   Comment: MICROSCOPIC NOT DONE ON URINES WITH NEGATIVE PROTEIN, BLOOD, LEUKOCYTES, NITRITE, OR GLUCOSE <1000 mg/dL.  GLUCOSE, CAPILLARY     Status: Abnormal   Collection Time    01/04/14  1:00 PM      Result Value Ref Range   Glucose-Capillary 101 (*) 70 - 99 mg/dL  PROTIME-INR     Status: None   Collection Time    01/04/14  1:33 PM      Result Value Ref Range   Prothrombin Time 14.3  11.6 - 15.2 seconds   INR 1.13  0.00 - 1.49  APTT     Status: None   Collection Time    01/04/14  1:33 PM      Result Value Ref Range   aPTT 27  24 - 37 seconds  TYPE AND SCREEN     Status: None   Collection Time    01/04/14  1:35 PM      Result Value Ref Range   ABO/RH(D) A POS     Antibody Screen NEG     Sample Expiration 01/07/2014      Dg Hip Complete Right  01/04/2014   CLINICAL DATA:  Fall with hip deformity  EXAM: RIGHT HIP - COMPLETE 2+ VIEW  COMPARISON:  None.  FINDINGS: Acute intertrochanteric right femur fracture with marked varus angulation. The femoral head remains located.  Limited evaluation the bony pelvis due to rightward rotation. There is callus around the inferior pubic ramus on the right compatible with remote fracture. No acute pelvic ring fracture detected. The left hip is located.  IMPRESSION: 1.  Acute, impacted intertrochanteric right femur fracture. 2. Remote right obturator ring fracture.   Electronically Signed   By: Jorje Guild M.D.   On: 01/04/2014 03:08    ROS the patient denies any shortness of breath or chest pain or  any other injuries but her moderate dementia limits review of systems. Blood pressure 168/83, pulse 6, temperature 98.7 F (37.1 C), temperature  source Oral, resp. rate 13, SpO2 100.00%. Physical Exam the right lower extremity is 2 cm shortened and externally rotated she is tender over the trochanteric region of the right hip there is no obvious bruising. She is nontender over the knee and ankle. Her foot is warm she has a normal sensation to her foot and normal pulses to  Assessment/Plan: Assessment: 80 year old woman with moderate dementia and multiple medical issues with a displaced varus basicervical right hip intertrochanteric fracture.  Plan: She has been admitted to the medicine service and is cleared for surgical intervention the risks and benefits of open reduction internal fixation with a dynamic hip screw have been discussed with the patient her husband and her family will try to get this accomplished today to facilitate getting her up which will benefit her lungs and help prevent bedsores and blood clots. Going forward to the family and her husband agree that skilled nursing home placement long-term will be required.  Frederik Pear J 01/04/2014, 2:35 PM

## 2014-01-04 NOTE — Op Note (Signed)
DATE OF PROCEDURE: 06/10/2012  PREOPERATIVE DIAGNOSIS: R hip intertrochanteric fracture 3-part with varus displacement  POSTOPERATIVE DIAGNOSIS: Same  PROCEDURE: Open reduction internal fixation left hip intertrochanteric fracture using a DePuy TK 2 4 hole 140 short barrel sideplate, 75 mm lag screw keyed  SURGEON: Adelin Ventrella J  ASSISTANT: Eric K. Barton Dubois  (present throughout entire procedure and necessary for timely completion of the procedure) ANESTHESIA: General  BLOOD LOSS: 300 cc  FLUID REPLACEMENT: 1200 cc crystalloid  DRAINS: Foley Catheter  URINE OUTPUT: [id number]  COMPLICATIONS: none   INDICATIONS FOR PROCEDURE: R hip intertrochanteric fracture, 3-part, sustained from a fal. Patient. presented to the emergency room, was admitted by the medicine service and orthopedic consultation was obtained. To decrease pain and increase function we have recommended open reduction internal fixation using a dynamic hip screw. The risks, benefits, and alternatives were discussed at length including but not limited to the risks of infection, bleeding, nerve injury, stiffness, blood clots, the need for revision surgery, cardiopulmonary complications, among others, and they were willing to proceed. Benefits have been discussed. Questions answered.   PROCEDURE IN DETAIL: The patient was identified by armband,  received preoperative IV antibiotics in the holding area, taken to the operating room , appropriate anesthetic monitors were attached and general endotracheal anesthesia induced. Pt. was then transferred to a radiolucent flat Jackson table, rolled into the L lateral decubitus position and fixed there with a Stulberg Mark 2 pelvic clamp. Under C-arm imaging control we then performed a closed reduction with abduction and internal rotation obtaining a near-anatomic reduction with the leg in full extension. The lateral aspect of the hip and thigh was then prepped and draped in usual sterile fashion in the  iliac crest to the knee. A timeout procedure was performed. A 10 centimeter incision starting out at the flare of the greater trochanter and going distally was made along the lateral thigh through the skin and subcutaneous tissue down to the level of the IT band which was then cut in line with the skin incision exposing the vastus lateralis. This was likewise split taking [place] down to the flare of the greater trochanter and down the lateral side of the femur for about 8-10 cm. Under C-arm image control we then placed a guide pin at at 145 angle to the lateral flare of the greater trochanter up the femoral neck and into the center of the femoral head on the AP and lateral views. This measured at 82 mm and the triple reamer was set at 75 mm. Bone quality was actually quite good during the reaming. We then used to tap to prepare the drill hole for an 75 mm lag screw, which was placed without difficulty over the guide pin. We then selected a 140 4-hole sideplate placed it over the lag screw and fixed it to the lateral femur, recreating the normal valgus of 135, with 4 bicortical 4.5 mm screws. C-arm images were taken confirming a near-anatomic reduction. The wound was then thoroughly irrigated with normal saline solution. The vastus lateralis was closed with running 0 Vicryl suture, the IT band with running #1 Vicryl suture, the subcutaneous tissue with 0 and 2-0 undyed Vicryl suture and the skin with 3- nylon. A dressing of Mepilex was then applied the patient was unclamped rolled supine awakened extubated and taken to the recovery room without difficulty.  Frederik Pear J  06/10/2012, 7:29 PM

## 2014-01-04 NOTE — H&P (Signed)
PCP:   Reginia Naas, MD   Chief Complaint:  [place], rt hip pain  HPI: 80 yo female lives in assisted living, was getting up to go get kleenex from her bedside stand, when she turned around she lost her balance and fell to the floor.  Hurt right hip.  No head injury.  No loc.  Pt continued to have rt hip pain all day.  She has dementia documented in her chart, but pt is alert and oriented x 3, and her story that she tells me is consistent with the story she report to the ED extender.  History appears reliable, and pt mentation is normal.  She denies any recent illnesses.  Still ambulatory.  She is worried about having surgery, and is not sure this is what she wants.  Review of Systems:  Positive and negative as per HPI otherwise all other systems are negative  Past Medical History: Past Medical History  Diagnosis Date  . MI, old   . Parkinson disease   . Scoliosis   . Arthritis   . Bladder cancer   . HTN (hypertension) 04/21/2012  . Hyperlipidemia 04/21/2012  . DM (diabetes mellitus) 04/21/2012  . CAD (coronary artery disease) 04/21/2012  . Barrett esophagus 04/21/2012  . PUD (peptic ulcer disease) 04/21/2012  . Adrenal insufficiency 04/21/2012  . Raynaud's disease 04/21/2012  . Anxiety 04/21/2012  . Fibromyalgia 04/21/2012  . Hx of bladder cancer 04/21/2012  . Edema of lower extremity 04/21/2012    Chronic lower extremity edema  . Falls 04/21/2012  . Recurrent UTI 01/20/2013  . Encephalopathy 11/18/2013   Past Surgical History  Procedure Laterality Date  . Tonsillectomy    . Appendectomy    . Abdominal hysterectomy      Medications: Prior to Admission medications   Medication Sig Start Date End Date Taking? Authorizing Provider  amLODipine (NORVASC) 10 MG tablet Take 10 mg by mouth every morning.    Yes Historical Provider, MD  aspirin 81 MG tablet Take 81 mg by mouth every morning.    Yes Historical Provider, MD  bisacodyl (DULCOLAX) 10 MG suppository Place 10 mg rectally  every 3 (three) days as needed for moderate constipation.   Yes Historical Provider, MD  carbidopa-levodopa (SINEMET IR) 25-100 MG per tablet Take 1 tablet by mouth 3 (three) times daily.   Yes Historical Provider, MD  citalopram (CELEXA) 20 MG tablet Take 20 mg by mouth daily.   Yes Historical Provider, MD  famotidine (PEPCID) 20 MG tablet Take 20 mg by mouth 2 (two) times daily.   Yes Historical Provider, MD  furosemide (LASIX) 20 MG tablet Take 20 mg by mouth daily.   Yes Historical Provider, MD  HYDROcodone-acetaminophen (NORCO/VICODIN) 5-325 MG per tablet Take 2 tablets by mouth every 6 (six) hours as needed for moderate pain.   Yes Historical Provider, MD  ibuprofen (ADVIL,MOTRIN) 200 MG tablet Take 400 mg by mouth every 6 (six) hours as needed for fever, headache or moderate pain.   Yes Historical Provider, MD  LORazepam (ATIVAN) 1 MG tablet Take 1 mg by mouth every 4 (four) hours as needed for anxiety.   Yes Historical Provider, MD  magnesium oxide (MAG-OX) 400 MG tablet Take 400 mg by mouth daily.   Yes Historical Provider, MD  pantoprazole (PROTONIX) 40 MG tablet Take 40 mg by mouth 2 (two) times daily.   Yes Historical Provider, MD  QUEtiapine (SEROQUEL) 25 MG tablet Take 12.5-25 mg by mouth daily. 12.5 am in the am  and 25 mg in the pm   Yes Historical Provider, MD  senna-docusate (SENOKOT-S) 8.6-50 MG per tablet Take 2 tablets by mouth 2 (two) times daily.   Yes Historical Provider, MD    Allergies:   Allergies  Allergen Reactions  . Mirtazapine Other (See Comments)    confusion  . Codeine Hives  . Latex Rash    Use cotton dressings    Social History:  reports that she has quit smoking. Her smoking use included Cigarettes. She smoked 0.00 packs per day. She has never used smokeless tobacco. She reports that she does not drink alcohol or use illicit drugs.  Family History: Family History  Problem Relation Age of Onset  . Heart disease Mother   . Diabetes Mother   . Bladder  Cancer Maternal Aunt     Physical Exam: Filed Vitals:   01/04/14 0435 01/04/14 0530 01/04/14 0538 01/04/14 0602  BP: 168/80  158/77 159/86  Pulse: 71 73 73 69  Temp:      TempSrc:      Resp: 16  18 16   SpO2: 93% 90% 96% 99%   General appearance: alert, cooperative and no distress Head: Normocephalic, without obvious abnormality, atraumatic Eyes: negative Nose: Nares normal. Septum midline. Mucosa normal. No drainage or sinus tenderness. Neck: no JVD and supple, symmetrical, trachea midline Lungs: clear to auscultation bilaterally Heart: regular rate and rhythm, S1, S2 normal, no murmur, click, rub or gallop Abdomen: soft, non-tender; bowel sounds normal; no masses,  no organomegaly Extremities: extremities normal, atraumatic, no cyanosis or edema Pulses: 2+ and symmetric Skin: Skin color, texture, turgor normal. No rashes or lesions Neurologic: Grossly normal    Labs on Admission:   Recent Labs  01/04/14 0430  NA 139  K 4.6  CL 101  CO2 24  GLUCOSE 124*  BUN 21  CREATININE 1.13*  CALCIUM 9.9    Recent Labs  01/04/14 0430  WBC 16.0*  NEUTROABS 13.0*  HGB 11.0*  HCT 34.1*  MCV 87.2  PLT 291    Radiological Exams on Admission: Dg Hip Complete Right  01/04/2014   CLINICAL DATA:  Fall with hip deformity  EXAM: RIGHT HIP - COMPLETE 2+ VIEW  COMPARISON:  None.  FINDINGS: Acute intertrochanteric right femur fracture with marked varus angulation. The femoral head remains located.  Limited evaluation the bony pelvis due to rightward rotation. There is callus around the inferior pubic ramus on the right compatible with remote fracture. No acute pelvic ring fracture detected. The left hip is located.  IMPRESSION: 1.  Acute, impacted intertrochanteric right femur fracture. 2. Remote right obturator ring fracture.   Electronically Signed   By: Jorje Guild M.D.   On: 01/04/2014 03:08    Assessment/Plan  80 yo female with mechanical fall with right hip  fractuire  Principal Problem:   Hip fracture, right-  Per ortho team.  Pt has many questions on whether she actually will need surgery, encouraged her to talk to the surgeon, but did tell her that her recovery would probably be longer without the surgery than with the surgery, have deferred those to ortho team.  Certainly mod to high risk due to all of her comorbidities.  But still maintains an ambulatory good quality of life at her assisted living.  Keep npo for now.  Ck preop ekg.  Active Problems:   HTN (hypertension)   Parkinson's disease   Adrenal insufficiency   Raynaud's disease   Fall at nursing home  i have confirmed  her DNR status,  She wishes to remain DNR, and again is not sure about proceeding with surgery on her hip.  DAVID,RACHAL A 01/04/2014, 6:15 AM

## 2014-01-04 NOTE — Anesthesia Procedure Notes (Signed)
Procedure Name: Intubation Date/Time: 01/04/2014 3:34 PM Performed by: Izora Gala Pre-anesthesia Checklist: Patient identified, Emergency Drugs available, Suction available and Patient being monitored Patient Re-evaluated:Patient Re-evaluated prior to inductionOxygen Delivery Method: Circle system utilized Preoxygenation: Pre-oxygenation with 100% oxygen Intubation Type: IV induction Ventilation: Mask ventilation without difficulty Laryngoscope Size: Miller and 2 Grade View: Grade I Tube type: Oral Tube size: 7.0 mm Number of attempts: 1 Airway Equipment and Method: Stylet Placement Confirmation: ETT inserted through vocal cords under direct vision,  positive ETCO2 and breath sounds checked- equal and bilateral Secured at: 21 cm Tube secured with: Tape Dental Injury: Teeth and Oropharynx as per pre-operative assessment

## 2014-01-04 NOTE — ED Notes (Signed)
Pt left ED at this time with [facility] for tx to [facility].  NAD upon leaving dept.

## 2014-01-04 NOTE — ED Notes (Signed)
[phone] (pt's husband)

## 2014-01-04 NOTE — ED Notes (Signed)
Per [facility] - pt from [facility] assisted living - pt recently transferred to memory care unit and has not been given any of her prescription medications x24hrs to include pt's antihypertensives and pain medications - pt states she fell unsure of cause, staff picked pt up and placed into wheel chair - pt's husband arrived to facility. Per facility staff, pt was complaining of rt hip pain and pt was placed in wheelchair and pt's spouse called, no fall occurred per facility staff. Pt w/ hx of chronic rt hip pain d/t fibromyalgia - no obvious injuries or deformity noted by EMS. Pt's spouse gave pt ibuprofen and facility staff administered ativan PTA.

## 2014-01-04 NOTE — ED Notes (Signed)
Initial Contact - pt alert/oriented to person, place and situation, resting on stretcher, reports 7/10 pain to R hip, +shortening/rotation noted.  +csm/+pulses.  Pt denies complaints other than pain.  Otherwise skin PWD.  MAEI.  Speaking full/clear sentences, rr even/un-lab, lsctab dim.  Pt assisted to reposition for comfort.  NAD.  Awaiting [facility]'s arrival/transport to [facility].

## 2014-01-05 DIAGNOSIS — F411 Generalized anxiety disorder: Secondary | ICD-10-CM

## 2014-01-05 DIAGNOSIS — F329 Major depressive disorder, single episode, unspecified: Secondary | ICD-10-CM

## 2014-01-05 DIAGNOSIS — F3289 Other specified depressive episodes: Secondary | ICD-10-CM

## 2014-01-05 LAB — CBC
HCT: 26.1 % — ABNORMAL LOW (ref 36.0–46.0)
Hemoglobin: 8.6 g/dL — ABNORMAL LOW (ref 12.0–15.0)
MCH: 29 pg (ref 26.0–34.0)
MCHC: 33 g/dL (ref 30.0–36.0)
MCV: 87.9 fL (ref 78.0–100.0)
Platelets: 251 10*3/uL (ref 150–400)
RBC: 2.97 MIL/uL — ABNORMAL LOW (ref 3.87–5.11)
RDW: 16.7 % — AB (ref 11.5–15.5)
WBC: 11.9 10*3/uL — AB (ref 4.0–10.5)

## 2014-01-05 LAB — BASIC METABOLIC PANEL
BUN: 20 mg/dL (ref 6–23)
CALCIUM: 8.9 mg/dL (ref 8.4–10.5)
CO2: 24 mEq/L (ref 19–32)
Chloride: 103 mEq/L (ref 96–112)
Creatinine, Ser: 1.13 mg/dL — ABNORMAL HIGH (ref 0.50–1.10)
GFR calc Af Amer: 50 mL/min — ABNORMAL LOW (ref >90)
GFR, EST NON AFRICAN AMERICAN: 43 mL/min — AB (ref >90)
Glucose, Bld: 144 mg/dL — ABNORMAL HIGH (ref 70–99)
Potassium: 5.7 mEq/L — ABNORMAL HIGH (ref 3.7–5.3)
SODIUM: 138 meq/L (ref 137–147)

## 2014-01-05 LAB — MRSA PCR SCREENING: MRSA BY PCR: NEGATIVE

## 2014-01-05 MED ORDER — QUETIAPINE FUMARATE 25 MG PO TABS
25.0000 mg | ORAL_TABLET | Freq: Every day | ORAL | Status: DC
  Administered 2014-01-05 – 2014-01-06 (×2): 25 mg via ORAL
  Filled 2014-01-05 (×3): qty 1

## 2014-01-05 MED ORDER — FAMOTIDINE 20 MG PO TABS
20.0000 mg | ORAL_TABLET | Freq: Two times a day (BID) | ORAL | Status: DC
  Administered 2014-01-05 – 2014-01-07 (×5): 20 mg via ORAL
  Filled 2014-01-05 (×6): qty 1

## 2014-01-05 MED ORDER — CITALOPRAM HYDROBROMIDE 20 MG PO TABS
20.0000 mg | ORAL_TABLET | Freq: Every day | ORAL | Status: DC
  Administered 2014-01-05 – 2014-01-07 (×3): 20 mg via ORAL
  Filled 2014-01-05 (×3): qty 1

## 2014-01-05 MED ORDER — LORAZEPAM 1 MG PO TABS
1.0000 mg | ORAL_TABLET | ORAL | Status: DC | PRN
  Administered 2014-01-05 – 2014-01-07 (×4): 1 mg via ORAL
  Filled 2014-01-05 (×4): qty 1

## 2014-01-05 MED ORDER — QUETIAPINE 12.5 MG HALF TABLET
12.5000 mg | ORAL_TABLET | Freq: Every day | ORAL | Status: DC
  Administered 2014-01-05 – 2014-01-07 (×3): 12.5 mg via ORAL
  Filled 2014-01-05 (×3): qty 1

## 2014-01-05 MED ORDER — PANTOPRAZOLE SODIUM 40 MG PO TBEC
40.0000 mg | DELAYED_RELEASE_TABLET | Freq: Two times a day (BID) | ORAL | Status: DC
  Administered 2014-01-05 – 2014-01-06 (×4): 40 mg via ORAL
  Filled 2014-01-05 (×5): qty 1

## 2014-01-05 MED ORDER — ALUM & MAG HYDROXIDE-SIMETH 200-200-20 MG/5ML PO SUSP
15.0000 mL | Freq: Four times a day (QID) | ORAL | Status: DC | PRN
  Administered 2014-01-05: 15 mL via ORAL
  Filled 2014-01-05: qty 30

## 2014-01-05 MED ORDER — SENNOSIDES-DOCUSATE SODIUM 8.6-50 MG PO TABS
2.0000 | ORAL_TABLET | Freq: Two times a day (BID) | ORAL | Status: DC
  Administered 2014-01-05 – 2014-01-07 (×5): 2 via ORAL
  Filled 2014-01-05: qty 1
  Filled 2014-01-05 (×2): qty 2
  Filled 2014-01-05 (×2): qty 1

## 2014-01-05 MED ORDER — MAGNESIUM OXIDE 400 MG PO TABS
400.0000 mg | ORAL_TABLET | Freq: Every day | ORAL | Status: DC
  Administered 2014-01-05 – 2014-01-07 (×3): 400 mg via ORAL
  Filled 2014-01-05 (×3): qty 1

## 2014-01-05 MED ORDER — CARBIDOPA-LEVODOPA 25-100 MG PO TABS
1.0000 | ORAL_TABLET | Freq: Three times a day (TID) | ORAL | Status: DC
  Administered 2014-01-05 – 2014-01-07 (×7): 1 via ORAL
  Filled 2014-01-05 (×9): qty 1

## 2014-01-05 MED ORDER — FUROSEMIDE 20 MG PO TABS
20.0000 mg | ORAL_TABLET | Freq: Every day | ORAL | Status: DC
  Administered 2014-01-05 – 2014-01-06 (×2): 20 mg via ORAL
  Filled 2014-01-05 (×2): qty 1

## 2014-01-05 NOTE — Progress Notes (Signed)
Utilization review completed.  

## 2014-01-05 NOTE — Plan of Care (Signed)
Problem: Acute Rehab PT Goals(only PT should resolve) Goal: Patient Will Transfer Sit To/From Stand And use of sliding board

## 2014-01-05 NOTE — Evaluation (Signed)
Physical Therapy Evaluation Patient Details Name: [name] MRN: [id number] DOB: [birth year] Today's Date: 01/05/2014 Time: 0093-8182 PT Time Calculation (min): 22 min  PT Assessment / Plan / Recommendation History of Present Illness  Pt. admitted with fall resulting in R It hip fx, s/p ORIF.  Pt. has Parkinsons and moderate dementia.  Lived at ALF prior to this fall and chart reports pt. at transfers level , only walking with PT.    Clinical Impression  Pt. Presents to PT at a transfers level at her baseline now complicated by increased dependence due to new hip fx and ORIF, PWB status on R LE .  She has baseline level of demenia but was able to tolerate and participate in PT to some degree .  She had some confusion and some disorientation .  She will remain at a transfers level due to the ROM and strength limitations in her LEs  And now PWB status R LE.  She will benefit from acute PT intervention to reduce burden of care at the next venue of care and for pt.s overall comfort and life quality.  Will follow on min  3x/week basis.    PT Assessment  Patient needs continued PT services    Follow Up Recommendations  SNF;Supervision/Assistance - 24 hour;Supervision for mobility/OOB    Does the patient have the potential to tolerate intense rehabilitation      Barriers to Discharge        Equipment Recommendations  Other (comment) (TBD at SNF, will need w/c with cushion)    Recommendations for Other Services     Frequency Min 3X/week    Precautions / Restrictions Precautions Precautions: Fall Restrictions Weight Bearing Restrictions: Yes RLE Weight Bearing: Partial weight bearing RLE Partial Weight Bearing Percentage or Pounds: 50% Other Position/Activity Restrictions: Pt. with decreased range of motion in LEs R>L; legs tend to cross over    Pertinent Vitals/Pain Pt. Did not indicate pain and seemed comfortable during transfer      Mobility  Bed Mobility Overal bed mobility:  Needs Assistance;+2 for physical assistance Bed Mobility: Supine to Sit Supine to sit: +2 for physical assistance;Max assist General bed mobility comments: Pt. was not able to initiate moving to edge of bed and was managed with use of bed pad Transfers Overall transfer level: Needs assistance Equipment used: None Transfers: Squat Pivot Transfers Squat pivot transfers: +2 physical assistance;Total assist General transfer comment: Pr. with no ability to bear weight on L LE due to decreased ROM and is limited to 50% PWB on R with additional R LE ROM limitations.  She needs 2 assist and use of bedpad for lift transfer (pt. 97#).  Feel staff can liikely manage her transfers without difficulty at 2 assist level but lift equiopment could be used as well.  Ambulation/Gait Ambulation/Gait assistance:  (pt. unable)    Exercises     PT Diagnosis: Other (comment);Generalized weakness (difficulty transferring)  PT Problem List: Decreased strength;Decreased range of motion;Decreased activity tolerance;Decreased balance;Decreased mobility;Decreased cognition;Decreased knowledge of use of DME;Decreased safety awareness;Decreased knowledge of precautions PT Treatment Interventions: DME instruction;Functional mobility training;Therapeutic activities;Therapeutic exercise;Balance training;Patient/family education     PT Goals(Current goals can be found in the care plan section) Acute Rehab PT Goals Patient Stated Goal: pt. did not state but was agreeable with PT working with her to work on balance and mobility PT Goal Formulation: Patient unable to participate in goal setting Time For Goal Achievement: 01/12/14 Potential to Achieve Goals: [place]  Last PT Received On: 01/05/14 Assistance Needed: +2 History of Present Illness: Pt. admitted with fall resulting in R It hip fx, s/p ORIF.  Pt. has Parkinsons and moderate dementia.  Lived at ALF prior to this fall and chart reports pt. at  transfers level , only walking with PT.         Prior [place] expects to be discharged to:: Skilled nursing facility Prior Function Level of Independence: Needs assistance Gait / Transfers Assistance Needed: Pt. reportedly at transfers level only with walking "with the PT" Communication Communication: No difficulties;HOH (appears mildly HOH)    Cognition  Cognition Arousal/Alertness: Awake/alert Behavior During Therapy: WFL for tasks assessed/performed Overall Cognitive Status: No family/caregiver present to determine baseline cognitive functioning Memory: Decreased recall of precautions;Decreased short-term memory    Extremity/Trunk Assessment Upper Extremity Assessment Upper Extremity Assessment: Defer to OT evaluation (generally limited to 50% shoulder range with decreased grip) Lower Extremity Assessment Lower Extremity Assessment: RLE deficits/detail;LLE deficits/detail RLE Deficits / Details: leg is positioned in external rotation, knee flexion and ankle plantarflexion (appears her baseline positioning due to tightness/rigidity ).  Pt. with minimal volitional movement  LLE Deficits / Details: Le generally adducted and tight/rigid with PROM  at ankle ~ (-30) degrees.  L LE weightbearing doubful in the recent past due to positioning  Cervical / Trunk Assessment Cervical / Trunk Assessment: Kyphotic   Balance Balance Overall balance assessment: Needs assistance Sitting-balance support: Feet unsupported;Bilateral upper extremity supported Sitting balance-Leahy Scale: Poor Sitting balance - Comments: Pt. initially needing mod assist to remain upright at EOB, progressing to min guard level with UE support after she sat 5 minutes  End of Session PT - End of Session Equipment Utilized During Treatment: Gait belt Activity Tolerance: Patient tolerated treatment well Patient left: in chair;with call bell/phone within reach Nurse Communication: Mobility  status  GP Functional Assessment Tool Used: clinical judgement Functional Limitation: Mobility: Walking and moving around Mobility: Walking and Moving Around Current Status (F6433): At least 80 percent but less than 100 percent impaired, limited or restricted Mobility: Walking and Moving Around Goal Status [phone]): At least 60 percent but less than 80 percent impaired, limited or restricted   Ladona Ridgel 01/05/2014, 12:13 PM Gerlean Ren PT Acute Rehab Services [place] [phone]

## 2014-01-05 NOTE — Progress Notes (Signed)
[facility] PROGRESS NOTE  CARRIE PROTSMAN XLK:[id number] DOB: August 27, 1928 DOA: 01/04/2014 PCP: Allean Found, MD  Assessment/Plan: 1-Right Hip Fracture: Patient S/P Open reduction internal fixation left hip intertrochanteric fracture. For DVT prophylaxis aspirin.  2-Parkinson; resume levo-dopa.  3-HTN;lasix.  4-Anxiety; continue with ativan PRN.  5-hyperkalemia; Stop IV fluids with potassium supplement. Resume lasix.  6-Anemia, acute blood loss, post surgery; expected. Repeat cbc in am.   Code Status: DNR Family Communication: None at bedside.  Disposition Plan: SNF when medically stable.    Consultants:  Dr Turner Daniels.   Procedures: Open reduction internal fixation left hip intertrochanteric fracture using a DePuy TK 2 4 hole 140 short barrel sideplate, 75 mm lag screw keyed    Antibiotics:  none  HPI/Subjective: No complaints.   Objective: Filed Vitals:   01/05/14 0800  BP:   Pulse:   Temp:   Resp: 18    Intake/Output Summary (Last 24 hours) at 01/05/14 0932 Last data filed at 01/05/14 3664  Gross per 24 hour  Intake 2298.33 ml  Output    475 ml  Net 1823.33 ml   There were no vitals filed for this visit.  Exam:   General:  No acute distress.   Cardiovascular: S 1, S 2 RRR  Respiratory: Decrease breath sounds, no crackles.   Abdomen: BS present, soft, NT  Musculoskeletal: Trace edema.   Data Reviewed: Basic Metabolic Panel:  Recent Labs Lab 01/04/14 0430 01/05/14 0520  NA 139 138  K 4.6 5.7*  CL 101 103  CO2 24 24  GLUCOSE 124* 144*  BUN 21 20  CREATININE 1.13* 1.13*  CALCIUM 9.9 8.9   Liver Function Tests: No results found for this basename: AST, ALT, ALKPHOS, BILITOT, PROT, ALBUMIN,  in the last 168 hours No results found for this basename: LIPASE, AMYLASE,  in the last 168 hours No results found for this basename: AMMONIA,  in the last 168 hours CBC:  Recent Labs Lab 01/04/14 0430 01/05/14 0745  WBC 16.0* 11.9*   NEUTROABS 13.0*  --   HGB 11.0* 8.6*  HCT 34.1* 26.1*  MCV 87.2 87.9  PLT 291 251   Cardiac Enzymes: No results found for this basename: CKTOTAL, CKMB, CKMBINDEX, TROPONINI,  in the last 168 hours BNP (last 3 results)  Recent Labs  09/05/13 0908  PROBNP 1649.0*   CBG:  Recent Labs Lab 01/04/14 1300 01/04/14 1518 01/04/14 1813  GLUCAP 101* 95 111*    Recent Results (from the past 240 hour(s))  MRSA PCR SCREENING     Status: None   Collection Time    01/05/14  3:50 AM      Result Value Ref Range Status   MRSA by PCR NEGATIVE  NEGATIVE Final   Comment:            The GeneXpert MRSA Assay (FDA     approved for NASAL specimens     only), is one component of a     comprehensive MRSA colonization     surveillance program. It is not     intended to diagnose MRSA     infection nor to guide or     monitor treatment for     MRSA infections.     Studies: Dg Hip Complete Right  01/04/2014   CLINICAL DATA:  Fall with hip deformity  EXAM: RIGHT HIP - COMPLETE 2+ VIEW  COMPARISON:  None.  FINDINGS: Acute intertrochanteric right femur fracture with marked varus angulation. The femoral head remains located.  Limited evaluation the bony pelvis due to rightward rotation. There is callus around the inferior pubic ramus on the right compatible with remote fracture. No acute pelvic ring fracture detected. The left hip is located.  IMPRESSION: 1.  Acute, impacted intertrochanteric right femur fracture. 2. Remote right obturator ring fracture.   Electronically Signed   By: Tiburcio Pea M.D.   On: 01/04/2014 03:08   Dg Hip Operative Right  01/04/2014   CLINICAL DATA:  ORIF right hip  EXAM: DG OPERATIVE RIGHT HIP  TECHNIQUE: A single spot fluoroscopic AP image of the right hip is submitted.  COMPARISON:  DG HIP COMPLETE*R* dated 01/04/2014  FINDINGS: There has been interval placement of a right lateral proximal femoral compression plate and screws transfixing intertrochanteric fracture which is  in near anatomic alignment. There is no failure or complication.  IMPRESSION: ORIF right intertrochanteric fracture.   Electronically Signed   By: Elige Ko   On: 01/04/2014 17:07   Dg Pelvis Portable  01/04/2014   CLINICAL DATA:  Postop right hip fracture.  EXAM: PORTABLE PELVIS 1-2 VIEWS  COMPARISON:  None available.  FINDINGS: Patient is status post ORIF of the right hip. An intertrochanteric fracture through it is aligned. Atherosclerotic calcifications are present. Degenerative changes are present in the lower lumbar spine with dextro convex scoliosis.  IMPRESSION: 1. Status post ORIF for intra trochanteric fracture without radiographic evidence for complication. 2. Atherosclerosis. 3. Scoliosis.   Electronically Signed   By: Gennette Pac M.D.   On: 01/04/2014 20:29    Scheduled Meds: . aspirin EC  325 mg Oral Q breakfast  . carbidopa-levodopa  1 tablet Oral TID  . citalopram  20 mg Oral Daily  . docusate sodium  100 mg Oral BID  . famotidine  20 mg Oral BID  . furosemide  20 mg Oral Daily  . magnesium oxide  400 mg Oral Daily  . pantoprazole  40 mg Oral BID  . QUEtiapine  12.5-25 mg Oral Daily  . senna-docusate  2 tablet Oral BID   Continuous Infusions:   Principal Problem:   Hip fracture, right Active Problems:   HTN (hypertension)   Parkinson's disease   Adrenal insufficiency   Raynaud's disease   Fall at nursing home   Fracture of right hip    Time spent: 35 minutes.     Amarah Brossman  [facility] Pager [phone]. If 7PM-7AM, please contact night-coverage at [email or web address], password [facility] 01/05/2014, 9:32 AM  LOS: 1 day

## 2014-01-05 NOTE — Plan of Care (Signed)
Problem: Acute Rehab PT Goals(only PT should resolve) Goal: Patient Will Transfer Sit To/From Stand Transfer between level surfaces with aid of sliding board

## 2014-01-05 NOTE — Progress Notes (Signed)
Follow-up:   Pt rec'd into [facility] room 5N-12 s/p transfer from [facility]. Ms Hoe is an 80 y/o pt that presented to [facility] on the morning of 01/04/2014 s/p fall at her assisted living facility. ED evaluation revealed (R) hip fracture and pt was tx'd to [facility] for repair of her hip fx. At bedside pt noted sleeping in NAD. VSS. Will continue to monitor closely.  Jeryl Columbia, NP-C [facility] Pager [phone]

## 2014-01-05 NOTE — Care Management Note (Signed)
CARE MANAGEMENT NOTE 01/05/2014  Patient:  [name], [name]   Account Number:  [id number]  Date Initiated:  01/05/2014  Documentation initiated by:  Ricki Miller  Subjective/Objective Assessment:   80 yr old female s/p right hip ORIF     Action/Plan:   Patient is for shortterm SNF placement. Social worker is aware.   Anticipated DC Date:  01/07/2014   Anticipated DC Plan:  SKILLED NURSING FACILITY  In-house referral  Clinical Social Worker      DC Planning Services  CM consult      Choice offered to / List presented to:             Status of service:  Completed, signed off Medicare Important Message given?   (If response is "NO", the following Medicare IM given date fields will be blank) Date Medicare IM given:   Date Additional Medicare IM given:    Discharge Disposition:  [place]

## 2014-01-05 NOTE — Clinical Social Work Placement (Addendum)
Clinical Social Work Department  CLINICAL SOCIAL WORK PLACEMENT NOTE  Patient: SHENIQUE CHILDERS Account Number: [id number] Admit date: 01/04/14  Clinical Social Worker: Rhea Pink LCSWA Date/time: 01/05/2014 11:30 AM  Clinical Social Work is seeking post-discharge placement for this patient at the following level of care: SKILLED NURSING (*CSW will update this form in Epic as items are completed)  01/05/2014 Patient/family provided with [place] Department of Clinical Social Work's list of facilities offering this level of care within the geographic area requested by the patient (or if unable, by the patient's family).  01/05/2014 Patient/family informed of their freedom to choose among providers that offer the needed level of care, that participate in Medicare, Medicaid or managed care program needed by the patient, have an available bed and are willing to accept the patient.  01/05/2014 Patient/family informed of [facility]' ownership interest in [facility], as well as of the fact that they are under no obligation to receive care at this facility.  PASARR submitted to EDS on pre-exisiting PASARR number received from EDS on  FL2 transmitted to all facilities in geographic area requested by pt/family on 01/05/2014  FL2 transmitted to all facilities within larger geographic area on  Patient informed that his/her managed care company has contracts with or will negotiate with certain facilities, including the following:  Patient/family informed of bed offers received:  Patient chooses bed at  Physician recommends and patient chooses bed at  [facility] to be [facility] to [facility]14- Blima Rich, [place]  Patient to be transferred to facility by [facility]- Blima Rich, LCSWA  The following physician request were entered in Epic:  Additional Comments:  [employer] was called for authorization

## 2014-01-05 NOTE — Progress Notes (Signed)
Patient ID: [name], female   DOB: 06-24-1928, 80 y.o.   MRN: [id number] PATIENT ID: [name]  MRN: [id number]  DOB/AGE:  10-13-1928 / 80 y.o.  1 Day Post-Op Procedure(s) (LRB): COMPRESSION HIP- right  (Right)    PROGRESS NOTE Subjective: Patient is alert, oriented,1x Nausea, no Vomiting, yes passing gas, no Bowel Movement. Taking PO sips. Denies SOB, Chest or Calf Pain. Using Incentive Spirometer, PAS in place. Ambulate: Patient had a limited ambulation prior to surgery secondary to dementia. She is to be 50% weightbearing and may ambulate with physical therapy and may also be transferred from bed to chair Patient reports pain as 3 on 0-10 scale  .    Objective: Vital signs in last 24 hours: Filed Vitals:   01/05/14 0254 01/05/14 0400 01/05/14 0713 01/05/14 0800  BP: 107/46  132/40   Pulse: 60  69   Temp: 98.4 F (36.9 C)  98.7 F (37.1 C)   TempSrc: Oral  Oral   Resp: 18 16 18 18   SpO2: 100% 95% 97% 97%      Intake/Output from previous day: I/O last 3 completed shifts: In: 2298.3 [P.O.:200; I.V.:2098.3] Out: 475 [Urine:475]   Intake/Output this shift:     LABORATORY DATA:  Recent Labs  01/04/14 0430 01/04/14 1300 01/04/14 1333 01/04/14 1518 01/04/14 1813 01/05/14 0520  WBC 16.0*  --   --   --   --   --   HGB 11.0*  --   --   --   --   --   HCT 34.1*  --   --   --   --   --   PLT 291  --   --   --   --   --   NA 139  --   --   --   --  138  K 4.6  --   --   --   --  5.7*  CL 101  --   --   --   --  103  CO2 24  --   --   --   --  24  BUN 21  --   --   --   --  20  CREATININE 1.13*  --   --   --   --  1.13*  GLUCOSE 124*  --   --   --   --  144*  GLUCAP  --  101*  --  95 111*  --   INR  --   --  1.13  --   --   --   CALCIUM 9.9  --   --   --   --  8.9    Examination: Neurologically intact ABD soft Neurovascular intact Sensation intact distally Intact pulses distally Dorsiflexion/Plantar flexion intact Incision: scant drainage No cellulitis  present} XR AP&Lat of hip shows well placed\fixed THA  Assessment:   1 Day Post-Op Procedure(s) (LRB): COMPRESSION HIP- right  (Right) ADDITIONAL DIAGNOSIS:  Diabetes, Hypertension and moderate dementia, history of coronary artery disease, history of encephalopathy,Parkinson's disease  Plan: PT/OT WBAT, THA  posterior precautions  DVT Prophylaxis: SCDx72 hrs, ASA 325 mg BID x 2 weeks  DISCHARGE PLAN: Skilled Nursing Facility/Rehab  DISCHARGE NEEDS: HHPT, HHRN, CPM, Walker and 3-in-1 comode seat

## 2014-01-05 NOTE — Clinical Social Work Psychosocial (Signed)
Clinical Social Work Department  BRIEF PSYCHOSOCIAL ASSESSMENT  Patient: [name]  Account Number: [id number]   Admit date: 01/04/14 Clinical Social Worker  , MSW Date/Time: 01/05/2014 11:00 AM Referred by: Physician Date Referred: 01/04/14 Referred for   SNF Placement   Other Referral:  Interview type: Patient's husband.. Patient is pleasantly confused ( not oriented to place or time) Other interview type: PSYCHOSOCIAL DATA  [facility] Level of care: ALF Primary support name: William Hardiman Primary support relationship to patient: Husband Degree of support available:  Strong and vested  CURRENT CONCERNS  Current Concerns   Post-Acute Placement- patient was at [facility] 2 days and fell. Patient was at the [facility] previously  Other Concerns:  SOCIAL WORK ASSESSMENT / PLAN  CSW met with pt re: PT recommendation for SNF.   Pt lives at [facility]  CSW explained placement process and answered questions.   Pt's family reports [facility]  as their preference patient just discharge from [facility] two days ago  CSW completed FL2 and initiated SNF search.     Assessment/plan status: Information/Referral to Community Resources  Other assessment/ plan:  Information/referral to community resources:  SNF   PTAR  PATIENT'S/FAMILY'S RESPONSE TO PLAN OF CARE:  Pt family are agreeable to retuning to [facility] in order to increase strength and independence with mobility prior to returning back to [facility]  Pt's family was very jovial and had no questions or concerns.  Patient's family verbalized understanding of placement process and appreciation for CSW assist and support.    , MSW, LCSWA [phone] 

## 2014-01-06 LAB — PREPARE RBC (CROSSMATCH)

## 2014-01-06 LAB — BASIC METABOLIC PANEL
BUN: 25 mg/dL — ABNORMAL HIGH (ref 6–23)
CO2: 27 mEq/L (ref 19–32)
Calcium: 8.6 mg/dL (ref 8.4–10.5)
Chloride: 103 mEq/L (ref 96–112)
Creatinine, Ser: 1.35 mg/dL — ABNORMAL HIGH (ref 0.50–1.10)
GFR calc Af Amer: 41 mL/min — ABNORMAL LOW (ref >90)
GFR, EST NON AFRICAN AMERICAN: 35 mL/min — AB (ref >90)
GLUCOSE: 92 mg/dL (ref 70–99)
POTASSIUM: 5 meq/L (ref 3.7–5.3)
SODIUM: 138 meq/L (ref 137–147)

## 2014-01-06 LAB — GLUCOSE, CAPILLARY: GLUCOSE-CAPILLARY: 101 mg/dL — AB (ref 70–99)

## 2014-01-06 LAB — CBC
HCT: 21.9 % — ABNORMAL LOW (ref 36.0–46.0)
HEMOGLOBIN: 7.2 g/dL — AB (ref 12.0–15.0)
MCH: 28.8 pg (ref 26.0–34.0)
MCHC: 32.9 g/dL (ref 30.0–36.0)
MCV: 87.6 fL (ref 78.0–100.0)
Platelets: 194 10*3/uL (ref 150–400)
RBC: 2.5 MIL/uL — AB (ref 3.87–5.11)
RDW: 16.8 % — ABNORMAL HIGH (ref 11.5–15.5)
WBC: 7.1 10*3/uL (ref 4.0–10.5)

## 2014-01-06 MED ORDER — POLYETHYLENE GLYCOL 3350 17 G PO PACK
17.0000 g | PACK | Freq: Two times a day (BID) | ORAL | Status: DC
  Administered 2014-01-06 – 2014-01-07 (×2): 17 g via ORAL
  Filled 2014-01-06 (×3): qty 1

## 2014-01-06 MED ORDER — FUROSEMIDE 10 MG/ML IJ SOLN
20.0000 mg | Freq: Once | INTRAMUSCULAR | Status: AC
  Administered 2014-01-06: 20 mg via INTRAVENOUS
  Filled 2014-01-06: qty 2

## 2014-01-06 MED ORDER — DSS 100 MG PO CAPS: 100.0000 mg | ORAL_CAPSULE | Freq: Two times a day (BID) | ORAL | Status: AC

## 2014-01-06 MED ORDER — TAMSULOSIN HCL 0.4 MG PO CAPS
0.4000 mg | ORAL_CAPSULE | Freq: Every day | ORAL | Status: DC
  Administered 2014-01-07: 0.4 mg via ORAL
  Filled 2014-01-06 (×2): qty 1

## 2014-01-06 NOTE — Progress Notes (Signed)
[facility] can be transferred to [facility] on [facility]14. Discharge summary was sent to the facility along with the [facility] [phone]   Rhea Pink, [place], [place]

## 2014-01-06 NOTE — Progress Notes (Signed)
Patient still without spontaneous void since last I/O cath at [place].  Bladder scan revealed 337cc.  I/O cath performed per protocol and 200cc clear, yellow urine obtained.  Patient tolerated procedure well.  Continue to monitor and offer PO fluids.

## 2014-01-06 NOTE — Discharge Summary (Addendum)
Physician Discharge Summary  [name] UJW:[id number] DOB: 1928/01/15 DOA: 01/04/2014  PCP: Allean Found, MD  Admit date: 01/04/2014 Discharge date: 01/06/2014  Time spent: 35 minutes  Recommendations for Outpatient Follow-up:  1. Needs to follow up with Dr Turner Daniels post surgery.  2. Needs CBC to follow hb level. B-met to follow renal function.  3. Patient will need voiding trial in 24 to 48 hours.   Discharge Diagnoses:    Hip fracture, right   Anemia, acute blood loss anemia, expected.    HTN (hypertension)   Parkinson's disease   Adrenal insufficiency   Raynaud's disease   Fall at nursing home   Fracture of right hip   Discharge Condition: Stable.   Diet recommendation: Heart Healthy  There were no vitals filed for this visit.  History of present illness:  80 yo female lives in assisted living, was getting up to go get kleenex from her bedside stand, when she turned around she lost her balance and fell to the floor. Hurt right hip. No head injury. No loc. Pt continued to have rt hip pain all day. She has dementia documented in her chart, but pt is alert and oriented x 3, and her story that she tells me is consistent with the story she report to the ED extender. History appears reliable, and pt mentation is normal. She denies any recent illnesses. Still ambulatory. She is worried about having surgery, and is not sure this is what she wants.   Hospital Course:  1-Right Hip Fracture: Patient S/P Open reduction internal fixation left hip intertrochanteric fracture. For DVT prophylaxis aspirin. Docusate for Bowel regimen. PT per ortho.  2-Parkinson; resume levo-dopa.  3-HTN;lasix.  4-Anxiety; continue with ativan PRN.  5-Hyperkalemia; Stop IV fluids with potassium supplement. Resume lasix.  6-Anemia, acute blood loss, post surgery; expected. Will transfuse 2 units of PRBC. Repeat cbc in am.  7-Urine retention; will start flomax. Need voiding trial.   Procedures: Open  reduction internal fixation left hip intertrochanteric fracture using a DePuy TK 2 4 hole 140 short barrel sideplate, 75 mm lag screw keyed    Consultations:  Dr Turner Daniels.   Discharge Exam: Filed Vitals:   01/06/14 1101  BP:   Pulse:   Temp:   Resp: 18    General: no distress.  Cardiovascular: S 1, S 2 RRR Respiratory: CTA  Discharge Instructions  Discharge Orders   Future Appointments Provider Department Dept Phone   05/22/2014 2:30 PM Huston Foley, MD [facility] [phone]   Future Orders Complete By Expires   Diet - low sodium heart healthy  As directed    Increase activity slowly  As directed    Partial weight bearing  As directed    Questions:     % Body Weight:  50   Laterality:  right   Extremity:  Lower       Medication List    STOP taking these medications       aspirin 81 MG tablet  Replaced by:  aspirin EC 325 MG tablet     ibuprofen 200 MG tablet  Commonly known as:  ADVIL,MOTRIN      TAKE these medications       amLODipine 10 MG tablet  Commonly known as:  NORVASC  Take 10 mg by mouth every morning.     aspirin EC 325 MG tablet  Take 1 tablet (325 mg total) by mouth 2 (two) times daily.     bisacodyl 10 MG suppository  Commonly known as:  DULCOLAX  Place 10 mg rectally every 3 (three) days as needed for moderate constipation.     carbidopa-levodopa 25-100 MG per tablet  Commonly known as:  SINEMET IR  Take 1 tablet by mouth 3 (three) times daily.     citalopram 20 MG tablet  Commonly known as:  CELEXA  Take 20 mg by mouth daily.     DSS 100 MG Caps  Take 100 mg by mouth 2 (two) times daily.     famotidine 20 MG tablet  Commonly known as:  PEPCID  Take 20 mg by mouth 2 (two) times daily.     furosemide 20 MG tablet  Commonly known as:  LASIX  Take 20 mg by mouth daily.     HYDROcodone-acetaminophen 5-325 MG per tablet  Commonly known as:  NORCO/VICODIN  Take 1-2 tablets by mouth every 6 (six) hours as needed  for moderate pain.     LORazepam 1 MG tablet  Commonly known as:  ATIVAN  Take 1 mg by mouth every 4 (four) hours as needed for anxiety.     magnesium oxide 400 MG tablet  Commonly known as:  MAG-OX  Take 400 mg by mouth daily.     pantoprazole 40 MG tablet  Commonly known as:  PROTONIX  Take 40 mg by mouth 2 (two) times daily.     QUEtiapine 25 MG tablet  Commonly known as:  SEROQUEL  Take 12.5-25 mg by mouth daily. 12.5 am in the am and 25 mg in the pm     senna-docusate 8.6-50 MG per tablet  Commonly known as:  Senokot-S  Take 2 tablets by mouth 2 (two) times daily.       Allergies  Allergen Reactions  . Mirtazapine Other (See Comments)    confusion  . Codeine Hives  . Latex Rash    Use cotton dressings       Follow-up Information   Follow up with Nestor Lewandowsky, MD In 2 weeks.   Specialty:  Orthopedic Surgery   Contact information:   [address] [place] [address] [phone]       Follow up with Allean Found, MD In 1 week.   Specialty:  Family Medicine   Contact information:   [address] [place] [address] [phone]        The results of significant diagnostics from this hospitalization (including imaging, microbiology, ancillary and laboratory) are listed below for reference.    Significant Diagnostic Studies: Dg Hip Complete Right  01/04/2014   CLINICAL DATA:  Fall with hip deformity  EXAM: RIGHT HIP - COMPLETE 2+ VIEW  COMPARISON:  None.  FINDINGS: Acute intertrochanteric right femur fracture with marked varus angulation. The femoral head remains located.  Limited evaluation the bony pelvis due to rightward rotation. There is callus around the inferior pubic ramus on the right compatible with remote fracture. No acute pelvic ring fracture detected. The left hip is located.  IMPRESSION: 1.  Acute, impacted intertrochanteric right femur fracture. 2. Remote right obturator ring fracture.   Electronically Signed   By:  Tiburcio Pea M.D.   On: 01/04/2014 03:08   Dg Hip Operative Right  01/04/2014   CLINICAL DATA:  ORIF right hip  EXAM: DG OPERATIVE RIGHT HIP  TECHNIQUE: A single spot fluoroscopic AP image of the right hip is submitted.  COMPARISON:  DG HIP COMPLETE*R* dated 01/04/2014  FINDINGS: There has been interval placement of a right lateral proximal  femoral compression plate and screws transfixing intertrochanteric fracture which is in near anatomic alignment. There is no failure or complication.  IMPRESSION: ORIF right intertrochanteric fracture.   Electronically Signed   By: Elige Ko   On: 01/04/2014 17:07   Dg Pelvis Portable  01/04/2014   CLINICAL DATA:  Postop right hip fracture.  EXAM: PORTABLE PELVIS 1-2 VIEWS  COMPARISON:  None available.  FINDINGS: Patient is status post ORIF of the right hip. An intertrochanteric fracture through it is aligned. Atherosclerotic calcifications are present. Degenerative changes are present in the lower lumbar spine with dextro convex scoliosis.  IMPRESSION: 1. Status post ORIF for intra trochanteric fracture without radiographic evidence for complication. 2. Atherosclerosis. 3. Scoliosis.   Electronically Signed   By: Gennette Pac M.D.   On: 01/04/2014 20:29    Microbiology: Recent Results (from the past 240 hour(s))  MRSA PCR SCREENING     Status: None   Collection Time    01/05/14  3:50 AM      Result Value Ref Range Status   MRSA by PCR NEGATIVE  NEGATIVE Final   Comment:            The GeneXpert MRSA Assay (FDA     approved for NASAL specimens     only), is one component of a     comprehensive MRSA colonization     surveillance program. It is not     intended to diagnose MRSA     infection nor to guide or     monitor treatment for     MRSA infections.     Labs: Basic Metabolic Panel:  Recent Labs Lab 01/04/14 0430 01/05/14 0520 01/06/14 0540  NA 139 138 138  K 4.6 5.7* 5.0  CL 101 103 103  CO2 24 24 27   GLUCOSE 124* 144* 92  BUN 21 20  25*  CREATININE 1.13* 1.13* 1.35*  CALCIUM 9.9 8.9 8.6   Liver Function Tests: No results found for this basename: AST, ALT, ALKPHOS, BILITOT, PROT, ALBUMIN,  in the last 168 hours No results found for this basename: LIPASE, AMYLASE,  in the last 168 hours No results found for this basename: AMMONIA,  in the last 168 hours CBC:  Recent Labs Lab 01/04/14 0430 01/05/14 0745 01/06/14 0540  WBC 16.0* 11.9* 7.1  NEUTROABS 13.0*  --   --   HGB 11.0* 8.6* 7.2*  HCT 34.1* 26.1* 21.9*  MCV 87.2 87.9 87.6  PLT 291 251 194   Cardiac Enzymes: No results found for this basename: CKTOTAL, CKMB, CKMBINDEX, TROPONINI,  in the last 168 hours BNP: BNP (last 3 results)  Recent Labs  09/05/13 0908  PROBNP 1649.0*   CBG:  Recent Labs Lab 01/04/14 1300 01/04/14 1518 01/04/14 1813  GLUCAP 101* 95 111*       Signed:  Senaida Ores A Caralynn Gelber  [facility] 01/06/2014, 11:56 AM

## 2014-01-06 NOTE — Progress Notes (Signed)
PATIENT ID: [name]  MRN: [id number]  DOB/AGE:  08-Apr-1928 / 80 y.o.  2 Days Post-Op Procedure(s) (LRB): COMPRESSION HIP- right  (Right)    PROGRESS NOTE Subjective: Patient is alert, oriented,no Nausea, no Vomiting, yes passing gas, no Bowel Movement. Taking PO well. Denies SOB, Chest or Calf Pain. Using Incentive Spirometer, PAS in place. Ambulate  50% to RLE Patient reports pain as moderate  .    Objective: Vital signs in last 24 hours: Filed Vitals:   01/05/14 1200 01/05/14 2030 01/06/14 0703 01/06/14 0800  BP:  147/82 150/43   Pulse:  97 70   Temp:  98.4 F (36.9 C) 98.2 F (36.8 C)   TempSrc:  Axillary Axillary   Resp: 18 20 18 18   SpO2: 97% 100% 100% 100%      Intake/Output from previous day: I/O last 3 completed shifts: In: 1278.3 [P.O.:380; I.V.:898.3] Out: 750 [Urine:750]   Intake/Output this shift: Total I/O In: 240 [P.O.:240] Out: -    LABORATORY DATA:  Recent Labs  01/04/14 1300 01/04/14 1333 01/04/14 1518 01/04/14 1813 01/05/14 0520 01/05/14 0745 01/06/14 0540  WBC  --   --   --   --   --  11.9* 7.1  HGB  --   --   --   --   --  8.6* 7.2*  HCT  --   --   --   --   --  26.1* 21.9*  PLT  --   --   --   --   --  251 194  NA  --   --   --   --  138  --  138  K  --   --   --   --  5.7*  --  5.0  CL  --   --   --   --  103  --  103  CO2  --   --   --   --  24  --  27  BUN  --   --   --   --  20  --  25*  CREATININE  --   --   --   --  1.13*  --  1.35*  GLUCOSE  --   --   --   --  144*  --  92  GLUCAP 101*  --  95 111*  --   --   --   INR  --  1.13  --   --   --   --   --   CALCIUM  --   --   --   --  8.9  --  8.6    Examination: Neurologically intact Neurovascular intact Sensation intact distally Intact pulses distally Dorsiflexion/Plantar flexion intact Incision: scant drainage No cellulitis present Compartment soft} XR AP&Lat of hip shows well placed\fixed THA  Assessment:   2 Days Post-Op Procedure(s) (LRB): COMPRESSION HIP-  right  (Right) ADDITIONAL DIAGNOSIS:  Acute Blood Loss Anemia and Diabetes, Hypertension and moderate dementia, history of coronary artery disease, history of encephalopathy,Parkinson's disease  Plan: PT/OT WB at 50%, THA  posterior precautions  DVT Prophylaxis: SCDx72 hrs, ASA 325 mg BID x 2 weeks  DISCHARGE PLAN: Skilled Nursing Facility/Rehab  DISCHARGE NEEDS: HHPT, HHRN, Walker and 3-in-1 comode seat

## 2014-01-06 NOTE — Progress Notes (Signed)
OT Cancellation Note and DIscharge  Patient Details Name: [name] MRN: [id number] DOB: [birth year]   Cancelled Treatment:    Reason Eval/Treat Not Completed: Other (comment). Pt has already been approved for SNF (per talking with SW), will defer OT eval to that facility. Acute OT will sign off.  Almon Register [phone] 01/06/2014, 3:15 PM

## 2014-01-06 NOTE — Progress Notes (Signed)
Physical Therapy Treatment Patient Details Name: [name] MRN: [id number] DOB: 07/13/1928 Today's Date: 01/06/2014 Time: 9735-3299 PT Time Calculation (min): 13 min  PT Assessment / Plan / Recommendation  History of Present Illness Pt. admitted with fall resulting in R It hip fx, s/p ORIF.  Pt. has Parkinsons and moderate dementia.  Lived at ALF prior to this fall and chart reports pt. at transfers level , only walking with PT.     PT Comments   **pivot to recliner with +2 total assist, pt is pleasantly confused, performed AAROM to RLE*  Follow Up Recommendations  SNF;Supervision/Assistance - 24 hour;Supervision for mobility/OOB     Does the patient have the potential to tolerate intense rehabilitation     Barriers to Discharge        Equipment Recommendations  Other (comment) (TBD at SNF, will need w/c with cushion)    Recommendations for Other Services    Frequency Min 3X/week   Progress towards PT Goals Progress towards PT goals: Progressing toward goals  Plan Current plan remains appropriate    Precautions / Restrictions Restrictions Weight Bearing Restrictions: Yes RLE Weight Bearing: Partial weight bearing RLE Partial Weight Bearing Percentage or Pounds: 50% Other Position/Activity Restrictions: Pt. with decreased range of motion in LEs R>L; legs tend to cross over    Pertinent Vitals/Pain *Pt verbally denied pain, but grimaced with movement of RLE. **    Mobility  Bed Mobility Overal bed mobility: Needs Assistance;+2 for physical assistance Bed Mobility: Supine to Sit Supine to sit: +2 for physical assistance;Max assist General bed mobility comments: Pt. was not able to initiate moving to edge of bed and was managed with use of bed pad Transfers Overall transfer level: Needs assistance Equipment used: None Transfers: Stand Pivot Transfers Squat pivot transfers: +2 physical assistance;Total assist General transfer comment: Pr. with no ability to bear weight on  L LE due to decreased ROM and is limited to 50% PWB on R with additional R LE ROM limitations.  She needs 2 assist and use of bedpad for lift transfer (pt. 97#).  Feel staff can liikely manage her transfers without difficulty at 2 assist level but lift equiopment could be used as well.     Exercises General Exercises - Lower Extremity Heel Slides: AAROM;Right;10 reps;Supine Hip ABduction/ADduction: AAROM;Right;10 reps;Supine   PT Diagnosis:    PT Problem List:   PT Treatment Interventions:     PT Goals (current goals can now be found in the care plan section) Acute Rehab PT Goals Patient Stated Goal: pt. did not state but was agreeable with PT working with her to work on balance and mobility PT Goal Formulation: Patient unable to participate in goal setting Time For Goal Achievement: 01/12/14 Potential to Achieve Goals: Fair  Visit Information  Last PT Received On: 01/06/14 Assistance Needed: +2 History of Present Illness: Pt. admitted with fall resulting in R It hip fx, s/p ORIF.  Pt. has Parkinsons and moderate dementia.  Lived at ALF prior to this fall and chart reports pt. at transfers level , only walking with PT.      Subjective Data  Patient Stated Goal: pt. did not state but was agreeable with PT working with her to work on balance and mobility   Cognition  Cognition Arousal/Alertness: Awake/alert Behavior During Therapy: WFL for tasks assessed/performed Overall Cognitive Status: No family/caregiver present to determine baseline cognitive functioning Memory: Decreased recall of precautions;Decreased short-term memory    Balance  Balance Sitting balance-Leahy Scale: Poor  Sitting balance - Comments: Pt. initially needing mod assist to remain upright at EOB, progressing to min guard level with UE support after she sat 5 minutes  End of Session PT - End of Session Equipment Utilized During Treatment: Gait belt Activity Tolerance: Patient tolerated treatment well Patient  left: in chair;with call bell/phone within reach Nurse Communication: Mobility status   GP     Blondell Reveal Kistler 01/06/2014, 11:42 AM [phone]

## 2014-01-07 DIAGNOSIS — E119 Type 2 diabetes mellitus without complications: Secondary | ICD-10-CM

## 2014-01-07 LAB — TYPE AND SCREEN
ABO/RH(D): A POS
Antibody Screen: NEGATIVE
Unit division: 0
Unit division: 0

## 2014-01-07 LAB — BASIC METABOLIC PANEL
BUN: 31 mg/dL — ABNORMAL HIGH (ref 6–23)
CALCIUM: 8.8 mg/dL (ref 8.4–10.5)
CO2: 26 meq/L (ref 19–32)
Chloride: 103 mEq/L (ref 96–112)
Creatinine, Ser: 1.32 mg/dL — ABNORMAL HIGH (ref 0.50–1.10)
GFR, EST AFRICAN AMERICAN: 42 mL/min — AB (ref >90)
GFR, EST NON AFRICAN AMERICAN: 36 mL/min — AB (ref >90)
Glucose, Bld: 88 mg/dL (ref 70–99)
Potassium: 4.4 mEq/L (ref 3.7–5.3)
SODIUM: 139 meq/L (ref 137–147)

## 2014-01-07 LAB — CBC
HCT: 29.5 % — ABNORMAL LOW (ref 36.0–46.0)
Hemoglobin: 10 g/dL — ABNORMAL LOW (ref 12.0–15.0)
MCH: 28.4 pg (ref 26.0–34.0)
MCHC: 33.9 g/dL (ref 30.0–36.0)
MCV: 83.8 fL (ref 78.0–100.0)
PLATELETS: 190 10*3/uL (ref 150–400)
RBC: 3.52 MIL/uL — AB (ref 3.87–5.11)
RDW: 18.3 % — AB (ref 11.5–15.5)
WBC: 7.3 10*3/uL (ref 4.0–10.5)

## 2014-01-07 LAB — GLUCOSE, CAPILLARY
GLUCOSE-CAPILLARY: 92 mg/dL (ref 70–99)
Glucose-Capillary: 93 mg/dL (ref 70–99)

## 2014-01-07 MED ORDER — TAMSULOSIN HCL 0.4 MG PO CAPS: 0.4000 mg | ORAL_CAPSULE | Freq: Every day | ORAL | Status: AC

## 2014-01-07 MED ORDER — PANTOPRAZOLE SODIUM 40 MG PO PACK
40.0000 mg | PACK | Freq: Every day | ORAL | Status: DC
  Administered 2014-01-07: 40 mg via ORAL
  Filled 2014-01-07: qty 20

## 2014-01-07 MED ORDER — DOCUSATE SODIUM 50 MG/5ML PO LIQD
100.0000 mg | Freq: Two times a day (BID) | ORAL | Status: DC
  Filled 2014-01-07 (×2): qty 10

## 2014-01-07 MED ORDER — LORAZEPAM 1 MG PO TABS: 1.0000 mg | ORAL_TABLET | ORAL | Status: AC | PRN

## 2014-01-07 MED ORDER — ASPIRIN 325 MG PO TABS
325.0000 mg | ORAL_TABLET | Freq: Every day | ORAL | Status: DC
  Administered 2014-01-07: 325 mg via ORAL
  Filled 2014-01-07: qty 1

## 2014-01-07 MED ORDER — AMLODIPINE BESYLATE 5 MG PO TABS: 10.0000 mg | ORAL_TABLET | ORAL | Status: DC

## 2014-01-07 MED ORDER — POLYETHYLENE GLYCOL 3350 17 G PO PACK: 17.0000 g | PACK | Freq: Two times a day (BID) | ORAL | Status: AC

## 2014-01-07 NOTE — Discharge Summary (Signed)
Physician Discharge Summary  [name] YQM:[id number] DOB: 17-Feb-1928 DOA: 01/04/2014  PCP: Allean Found, MD  Admit date: 01/04/2014 Discharge date: 01/07/2014  Time spent: 35 minutes  Recommendations for Outpatient Follow-up:  1. Needs to follow up with Dr Turner Daniels post surgery.  2. Needs CBC to follow hb level. B-met to follow renal function.  3. Patient will need voiding trial in 24 to 48 hours.   Discharge Diagnoses:    Hip fracture, right   Anemia, acute blood loss anemia, expected.    Urine retention.    HTN (hypertension)   Parkinson's disease   Adrenal insufficiency   Raynaud's disease   Fall at nursing home   Fracture of right hip   Discharge Condition: Stable.   Diet recommendation: Heart Healthy  There were no vitals filed for this visit.  History of present illness:  80 yo female lives in assisted living, was getting up to go get kleenex from her bedside stand, when she turned around she lost her balance and fell to the floor. Hurt right hip. No head injury. No loc. Pt continued to have rt hip pain all day. She has dementia documented in her chart, but pt is alert and oriented x 3, and her story that she tells me is consistent with the story she report to the ED extender. History appears reliable, and pt mentation is normal. She denies any recent illnesses. Still ambulatory. She is worried about having surgery, and is not sure this is what she wants.   Hospital Course:  1-Right Hip Fracture: Patient S/P Open reduction internal fixation left hip intertrochanteric fracture. For DVT prophylaxis aspirin. Docusate for Bowel regimen. PT per ortho.  2-Parkinson; resume levo-dopa.  3-HTN;lasix. Hold Norvasc on discharge 4-Anxiety; continue with ativan PRN.  5-Hyperkalemia; Stop IV fluids with potassium supplement. Resume lasix.  6-Anemia, acute blood loss, post surgery; expected. Will transfuse 2 units of PRBC. Repeat cbc in am.  7-Urine retention; will start flomax.  Need voiding trial.   Procedures: Open reduction internal fixation left hip intertrochanteric fracture using a DePuy TK 2 4 hole 140 short barrel sideplate, 75 mm lag screw keyed    Consultations:  Dr Turner Daniels.   Discharge Exam: Filed Vitals:   01/07/14 0548  BP: 111/56  Pulse: 70  Temp: 98.1 F (36.7 C)  Resp: 18    General: no distress.  Cardiovascular: S 1, S 2 RRR Respiratory: CTA  Discharge Instructions      Discharge Orders   Future Appointments Provider Department Dept Phone   05/22/2014 2:30 PM Huston Foley, MD [facility] [phone]   Future Orders Complete By Expires   Diet - low sodium heart healthy  As directed    Diet - low sodium heart healthy  As directed    Increase activity slowly  As directed    Increase activity slowly  As directed    Partial weight bearing  As directed    Questions:     % Body Weight:  50   Laterality:  right   Extremity:  Lower       Medication List    STOP taking these medications       amLODipine 10 MG tablet  Commonly known as:  NORVASC     aspirin 81 MG tablet  Replaced by:  aspirin EC 325 MG tablet     ibuprofen 200 MG tablet  Commonly known as:  ADVIL,MOTRIN      TAKE these medications  aspirin EC 325 MG tablet  Take 1 tablet (325 mg total) by mouth 2 (two) times daily.     bisacodyl 10 MG suppository  Commonly known as:  DULCOLAX  Place 10 mg rectally every 3 (three) days as needed for moderate constipation.     carbidopa-levodopa 25-100 MG per tablet  Commonly known as:  SINEMET IR  Take 1 tablet by mouth 3 (three) times daily.     citalopram 20 MG tablet  Commonly known as:  CELEXA  Take 20 mg by mouth daily.     DSS 100 MG Caps  Take 100 mg by mouth 2 (two) times daily.     famotidine 20 MG tablet  Commonly known as:  PEPCID  Take 20 mg by mouth 2 (two) times daily.     furosemide 20 MG tablet  Commonly known as:  LASIX  Take 20 mg by mouth daily.      HYDROcodone-acetaminophen 5-325 MG per tablet  Commonly known as:  NORCO/VICODIN  Take 1-2 tablets by mouth every 6 (six) hours as needed for moderate pain.     LORazepam 1 MG tablet  Commonly known as:  ATIVAN  Take 1 mg by mouth every 4 (four) hours as needed for anxiety.     magnesium oxide 400 MG tablet  Commonly known as:  MAG-OX  Take 400 mg by mouth daily.     pantoprazole 40 MG tablet  Commonly known as:  PROTONIX  Take 40 mg by mouth 2 (two) times daily.     polyethylene glycol packet  Commonly known as:  MIRALAX / GLYCOLAX  Take 17 g by mouth 2 (two) times daily.     QUEtiapine 25 MG tablet  Commonly known as:  SEROQUEL  Take 12.5-25 mg by mouth daily. 12.5 am in the am and 25 mg in the pm     senna-docusate 8.6-50 MG per tablet  Commonly known as:  Senokot-S  Take 2 tablets by mouth 2 (two) times daily.     tamsulosin 0.4 MG Caps capsule  Commonly known as:  FLOMAX  Take 1 capsule (0.4 mg total) by mouth daily.       Allergies  Allergen Reactions  . Mirtazapine Other (See Comments)    confusion  . Codeine Hives  . Latex Rash    Use cotton dressings   Follow-up Information   Follow up with Nestor Lewandowsky, MD In 2 weeks.   Specialty:  Orthopedic Surgery   Contact information:   [address] [place] [address] [phone]       Follow up with Allean Found, MD In 1 week.   Specialty:  Family Medicine   Contact information:   [address], [address] [place] [address] [phone]        The results of significant diagnostics from this hospitalization (including imaging, microbiology, ancillary and laboratory) are listed below for reference.    Significant Diagnostic Studies: Dg Hip Complete Right  01/04/2014   CLINICAL DATA:  Fall with hip deformity  EXAM: RIGHT HIP - COMPLETE 2+ VIEW  COMPARISON:  None.  FINDINGS: Acute intertrochanteric right femur fracture with marked varus angulation. The femoral head remains located.   Limited evaluation the bony pelvis due to rightward rotation. There is callus around the inferior pubic ramus on the right compatible with remote fracture. No acute pelvic ring fracture detected. The left hip is located.  IMPRESSION: 1.  Acute, impacted intertrochanteric right femur fracture. 2. Remote right obturator ring  fracture.   Electronically Signed   By: Tiburcio Pea M.D.   On: 01/04/2014 03:08   Dg Hip Operative Right  01/04/2014   CLINICAL DATA:  ORIF right hip  EXAM: DG OPERATIVE RIGHT HIP  TECHNIQUE: A single spot fluoroscopic AP image of the right hip is submitted.  COMPARISON:  DG HIP COMPLETE*R* dated 01/04/2014  FINDINGS: There has been interval placement of a right lateral proximal femoral compression plate and screws transfixing intertrochanteric fracture which is in near anatomic alignment. There is no failure or complication.  IMPRESSION: ORIF right intertrochanteric fracture.   Electronically Signed   By: Elige Ko   On: 01/04/2014 17:07   Dg Pelvis Portable  01/04/2014   CLINICAL DATA:  Postop right hip fracture.  EXAM: PORTABLE PELVIS 1-2 VIEWS  COMPARISON:  None available.  FINDINGS: Patient is status post ORIF of the right hip. An intertrochanteric fracture through it is aligned. Atherosclerotic calcifications are present. Degenerative changes are present in the lower lumbar spine with dextro convex scoliosis.  IMPRESSION: 1. Status post ORIF for intra trochanteric fracture without radiographic evidence for complication. 2. Atherosclerosis. 3. Scoliosis.   Electronically Signed   By: Gennette Pac M.D.   On: 01/04/2014 20:29    Microbiology: Recent Results (from the past 240 hour(s))  MRSA PCR SCREENING     Status: None   Collection Time    01/05/14  3:50 AM      Result Value Ref Range Status   MRSA by PCR NEGATIVE  NEGATIVE Final   Comment:            The GeneXpert MRSA Assay (FDA     approved for NASAL specimens     only), is one component of a     comprehensive MRSA  colonization     surveillance program. It is not     intended to diagnose MRSA     infection nor to guide or     monitor treatment for     MRSA infections.     Labs: Basic Metabolic Panel:  Recent Labs Lab 01/04/14 0430 01/05/14 0520 01/06/14 0540 01/07/14 0450  NA 139 138 138 139  K 4.6 5.7* 5.0 4.4  CL 101 103 103 103  CO2 24 24 27 26   GLUCOSE 124* 144* 92 88  BUN 21 20 25* 31*  CREATININE 1.13* 1.13* 1.35* 1.32*  CALCIUM 9.9 8.9 8.6 8.8   Liver Function Tests: No results found for this basename: AST, ALT, ALKPHOS, BILITOT, PROT, ALBUMIN,  in the last 168 hours No results found for this basename: LIPASE, AMYLASE,  in the last 168 hours No results found for this basename: AMMONIA,  in the last 168 hours CBC:  Recent Labs Lab 01/04/14 0430 01/05/14 0745 01/06/14 0540 01/07/14 0450  WBC 16.0* 11.9* 7.1 7.3  NEUTROABS 13.0*  --   --   --   HGB 11.0* 8.6* 7.2* 10.0*  HCT 34.1* 26.1* 21.9* 29.5*  MCV 87.2 87.9 87.6 83.8  PLT 291 251 194 190   Cardiac Enzymes: No results found for this basename: CKTOTAL, CKMB, CKMBINDEX, TROPONINI,  in the last 168 hours BNP: BNP (last 3 results)  Recent Labs  09/05/13 0908  PROBNP 1649.0*   CBG:  Recent Labs Lab 01/04/14 1300 01/04/14 1518 01/04/14 1813 01/06/14 2248 01/07/14 0650  GLUCAP 101* 95 111* 101* 92       Signed:  Senaida Ores A Shemeika Starzyk  [facility] 01/07/2014, 9:04 AM

## 2014-01-07 NOTE — Progress Notes (Signed)
PATIENT ID: [name]   3 Days Post-Op Procedure(s) (LRB): COMPRESSION HIP- right  (Right)  Subjective: Patient denies pain in right hip. No other complaints or concerns.   Objective:  Filed Vitals:   01/07/14 0548  BP: 111/56  Pulse: 70  Temp: 98.1 F (36.7 C)  Resp: 18     R hip dressing with scant dried blood Otherwise c/d/i Wiggles toes, distally NVI  Labs:   Recent Labs  01/05/14 0745 01/06/14 0540 01/07/14 0450  HGB 8.6* 7.2* 10.0*   Recent Labs  01/06/14 0540 01/07/14 0450  WBC 7.1 7.3  RBC 2.50* 3.52*  HCT 21.9* 29.5*  PLT 194 190   Recent Labs  01/06/14 0540 01/07/14 0450  NA 138 139  K 5.0 4.4  CL 103 103  CO2 27 26  BUN 25* 31*  CREATININE 1.35* 1.32*  GLUCOSE 92 88  CALCIUM 8.6 8.8    Assessment and Plan: 50% WB R LE, THA posterior hip precautions DVT Prophylaxis: SCDx72 hrs, ASA 325 mg BID x 2 weeks  DISCHARGE PLAN: Skilled Nursing Facility/Rehab  DISCHARGE NEEDS: HHPT, HHRN, Walker and 3-in-1 comode seat Discharger per primary team, looks like it will be today Pain rx/ASA scripts in chart  VTE proph: ASA 325mg  BID, SCDs

## 2014-01-07 NOTE — Progress Notes (Signed)
Patient is medically stable to D/C to [facility] today. Per RN supervisor patient is cleared to come today. Patient's husband was at bedside and is aware of above. Clinical Education officer, museum (CSW) prepared D/C packet and arranged non-emergency EMS ([facility]) for transport. Nursing is aware of above. Please reconsult if further social work needs arise. CSW signing off.   Blima Rich, Jennette Weekend CSW [phone]

## 2014-01-10 ENCOUNTER — Encounter (HOSPITAL_COMMUNITY): Payer: Self-pay | Admitting: Orthopedic Surgery

## 2014-02-04 ENCOUNTER — Encounter: Payer: Self-pay | Admitting: *Deleted

## 2014-04-03 DIAGNOSIS — Z0271 Encounter for disability determination: Secondary | ICD-10-CM

## 2014-05-03 DEATH — deceased

## 2014-05-15 ENCOUNTER — Telehealth: Payer: Self-pay | Admitting: Neurology

## 2014-05-15 NOTE — Telephone Encounter (Signed)
Patient's husband called to state that patient has passed away.  Patient needs to be marked deceased in chart.

## 2014-05-16 NOTE — Telephone Encounter (Signed)
Called pt's husband Gwyndolyn Saxon and got more information. Pt's information was entered into her chart. FYI

## 2014-05-22 ENCOUNTER — Ambulatory Visit: Payer: Medicare PPO | Admitting: Neurology

## 2015-09-12 IMAGING — CT CT CHEST W/O CM
2 of 4 series · 16 of 46 positions shown, 18 images · non-contrast
Comparison: 11/21/2012

CLINICAL DATA: Fall. Hip and rib pain.

EXAM:
CT CHEST WITHOUT CONTRAST
TECHNIQUE: Multidetector CT imaging of the chest was performed following the
standard protocol without IV contrast.

[Series 2: chest/abd/pel 5.0 b31f · axial · 0.68mm/px · z∈[-498,-23]mm · 13 of 107 slices shown, 15 images]
[im 6/107  soft-tissue]
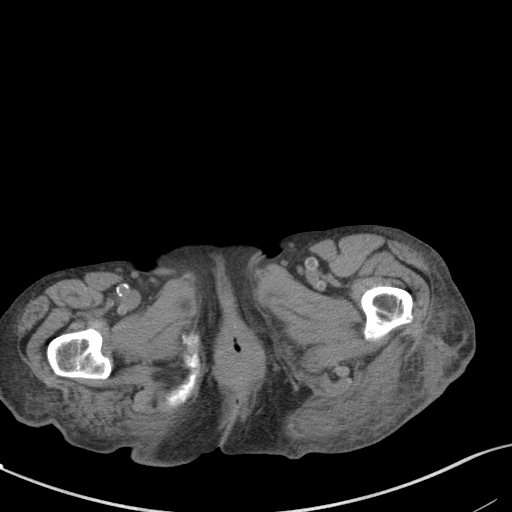
[im 6/107  bone]
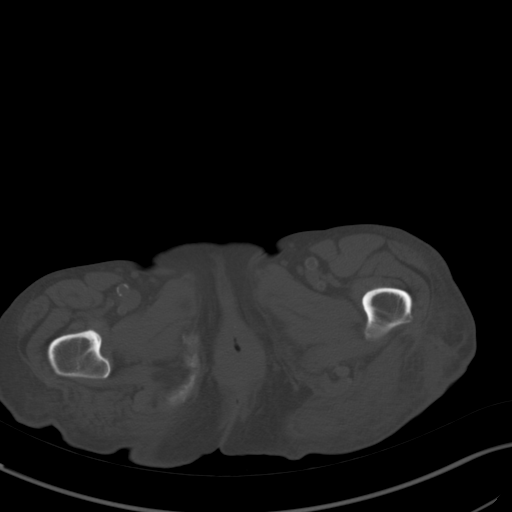
[im 16/107  soft-tissue]
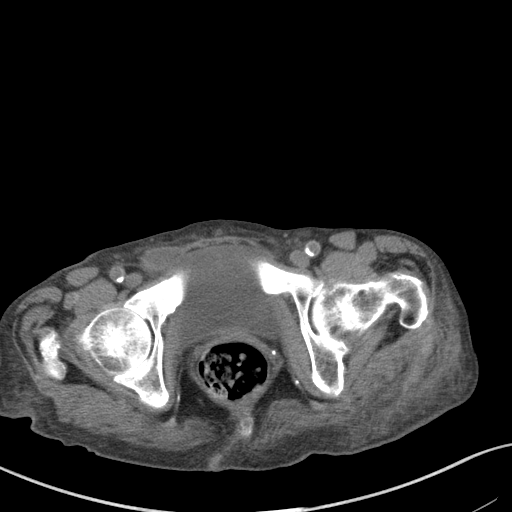
[im 21/107  soft-tissue]
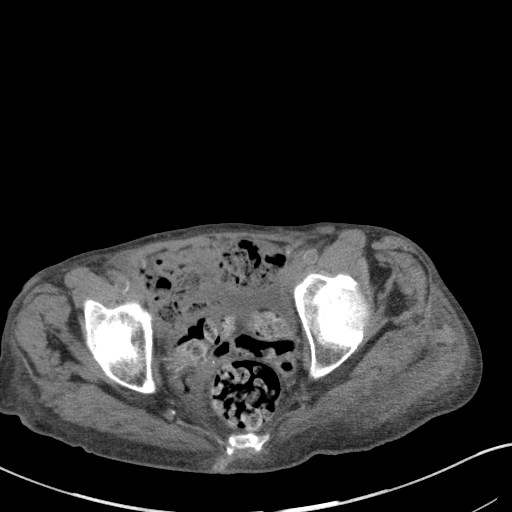
[im 31/107  soft-tissue]
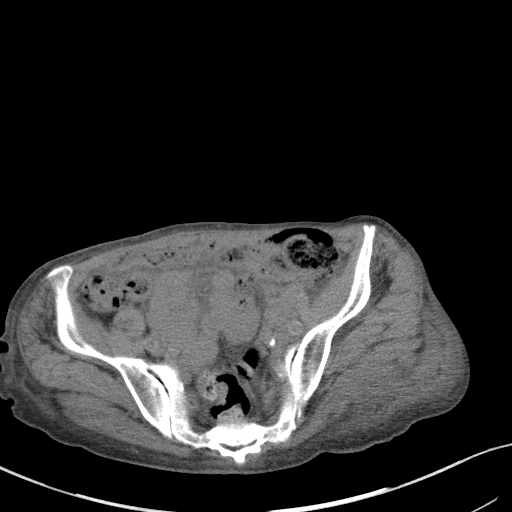
[im 36/107  soft-tissue]
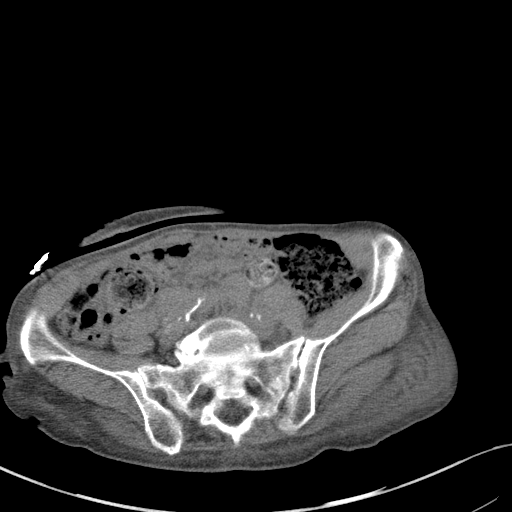
[im 46/107  soft-tissue]
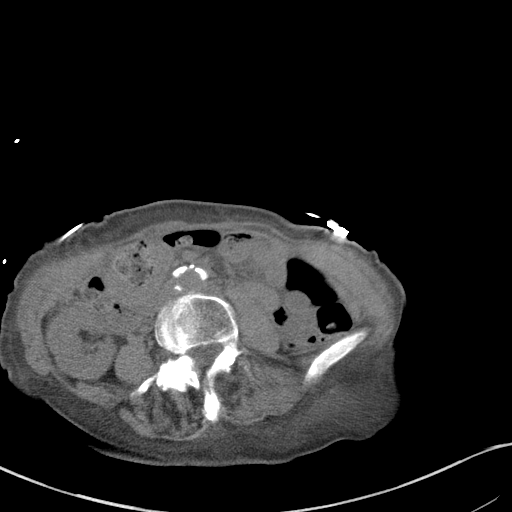
[im 56/107  soft-tissue]
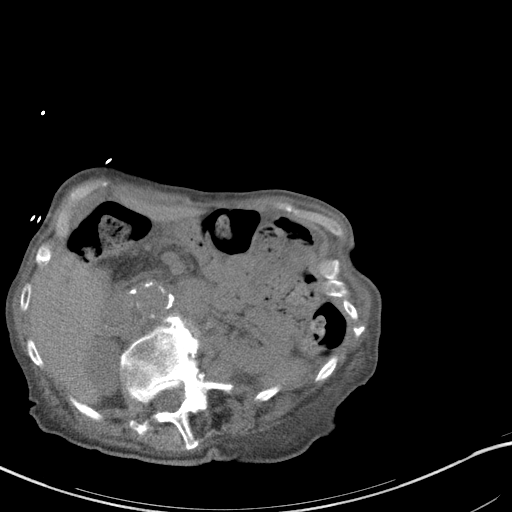
[im 61/107  soft-tissue]
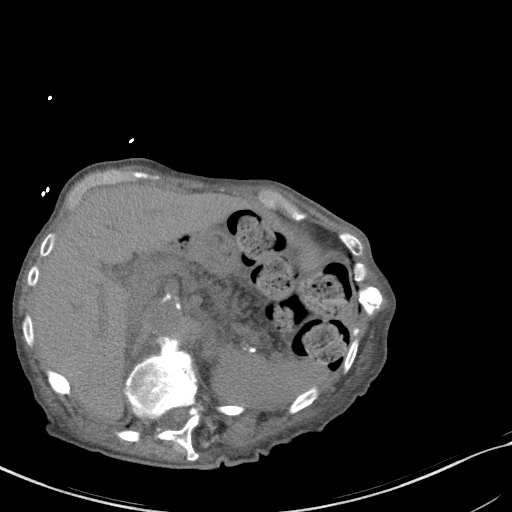
[im 71/107  soft-tissue]
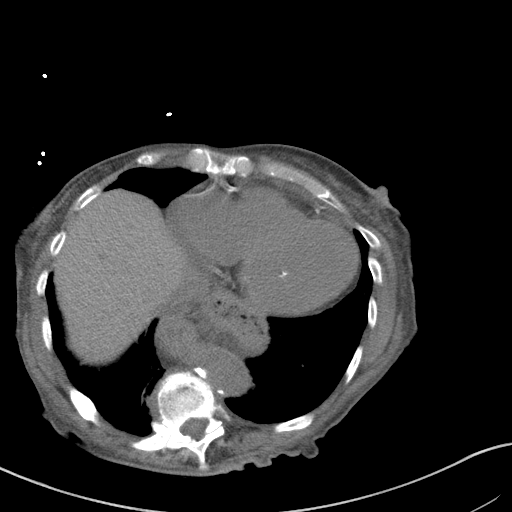
[im 71/107  bone]
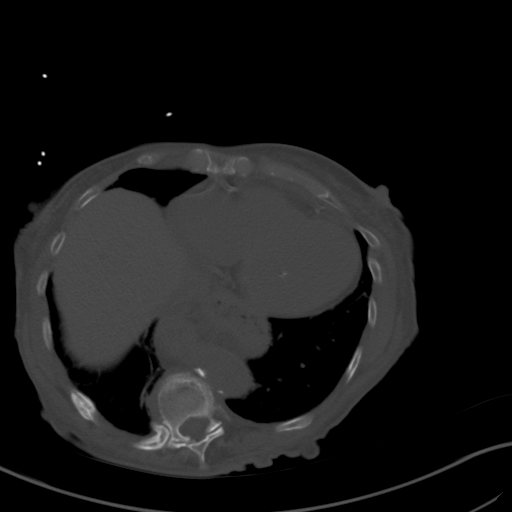
[im 76/107  soft-tissue]
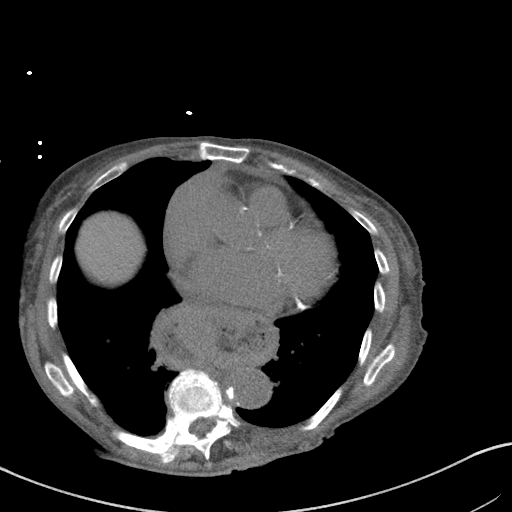
[im 86/107  soft-tissue]
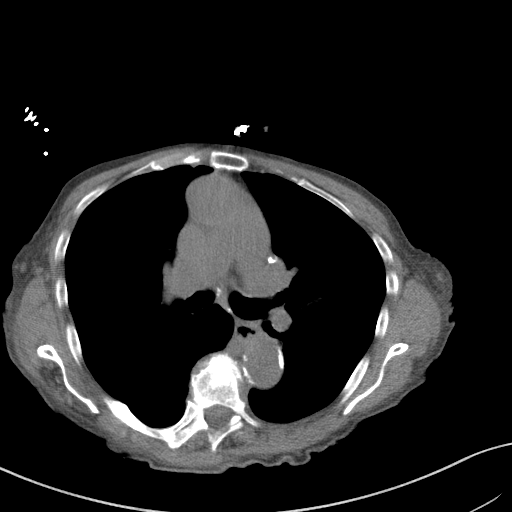
[im 91/107  soft-tissue]
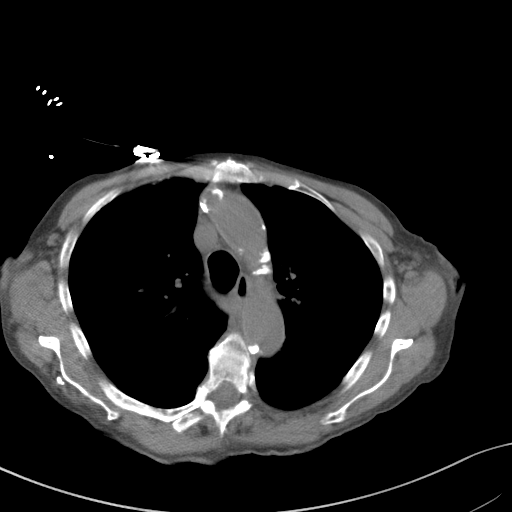
[im 101/107  soft-tissue]
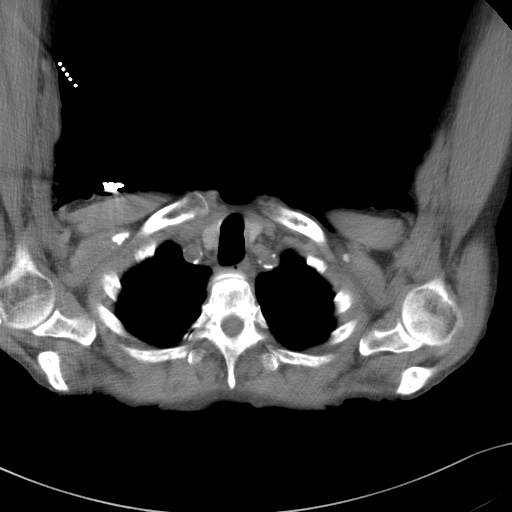

[Series 5: chest/abd/pel 3.0 coronal · coronal · 0.65mm/px · 3 of 74 slices shown]
[im 25/74  soft-tissue]
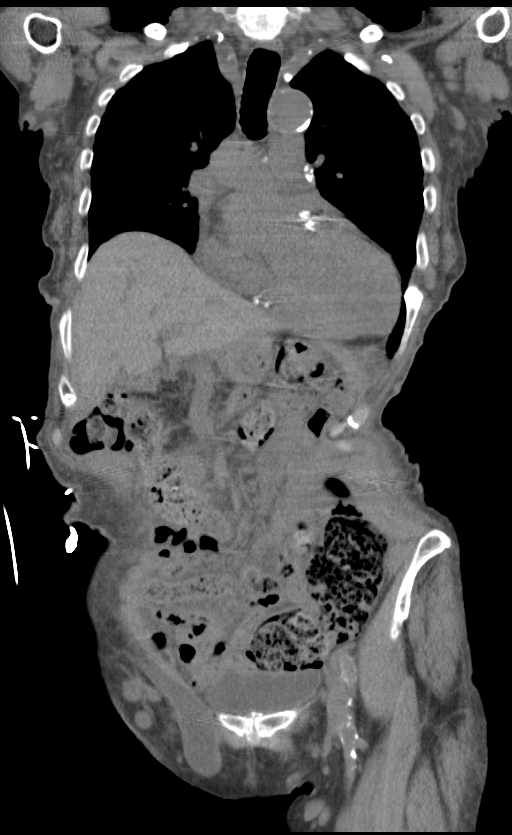
[im 33/74  soft-tissue]
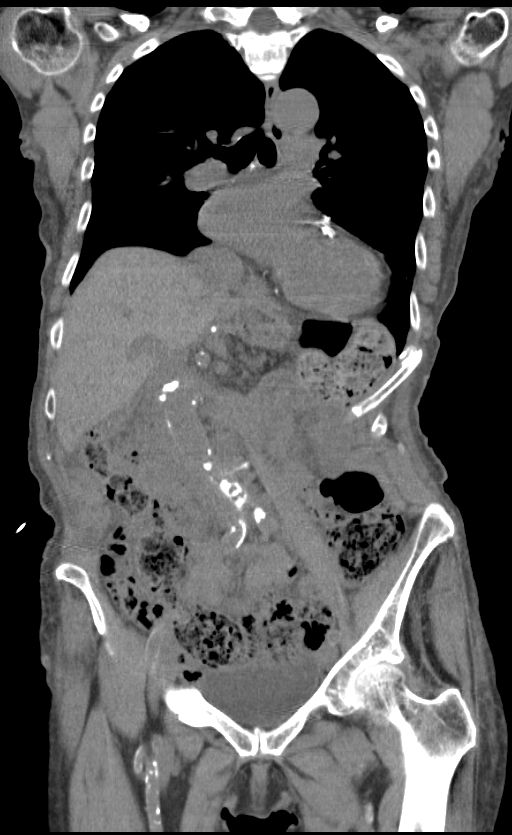
[im 41/74  soft-tissue]
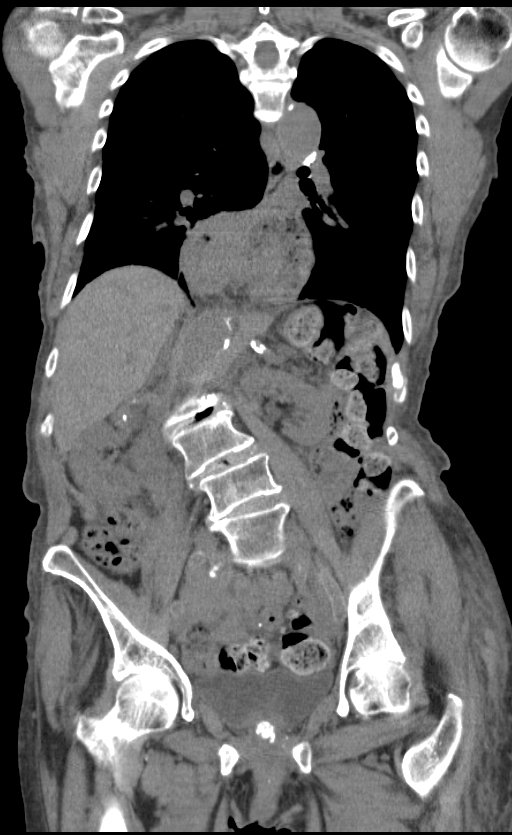

[16 of 46 positions shown; findings below may reference images not displayed]

FINDINGS: The lungs are adequately inflated without focal consolidation or
effusion. There are 2 small calcified granulomas over the left lung.
There is mild cardiomegaly with a small amount of pericardial fluid
present. There is moderate calcified atherosclerotic disease
involving coronary arteries. There is a moderate size hiatal hernia
unchanged. There are multiple old left anterior and right posterior
rib fractures. There are degenerative changes of the spine. There is
moderate curvature of the thoracic spine convex to the right. There
is a moderate compression fracture of T7 which is new since the
previous exam.
IMPRESSION: No acute findings in the chest.

Bilateral old rib fractures.

Mild cardiomegaly with small pericardial effusion.

Stable moderate size hiatal hernia.

Moderate atherosclerotic coronary artery disease.

## 2015-09-12 IMAGING — CR DG WRIST COMPLETE 3+V*L*
4 series · 4 of 4 positions shown · non-contrast
Comparison: Left hand radiographs 03/14/2013

CLINICAL DATA: Fell on left side, left side pain and bruising

EXAM:
LEFT WRIST - COMPLETE 3+ VIEW

[view not recorded (1 of 4)]
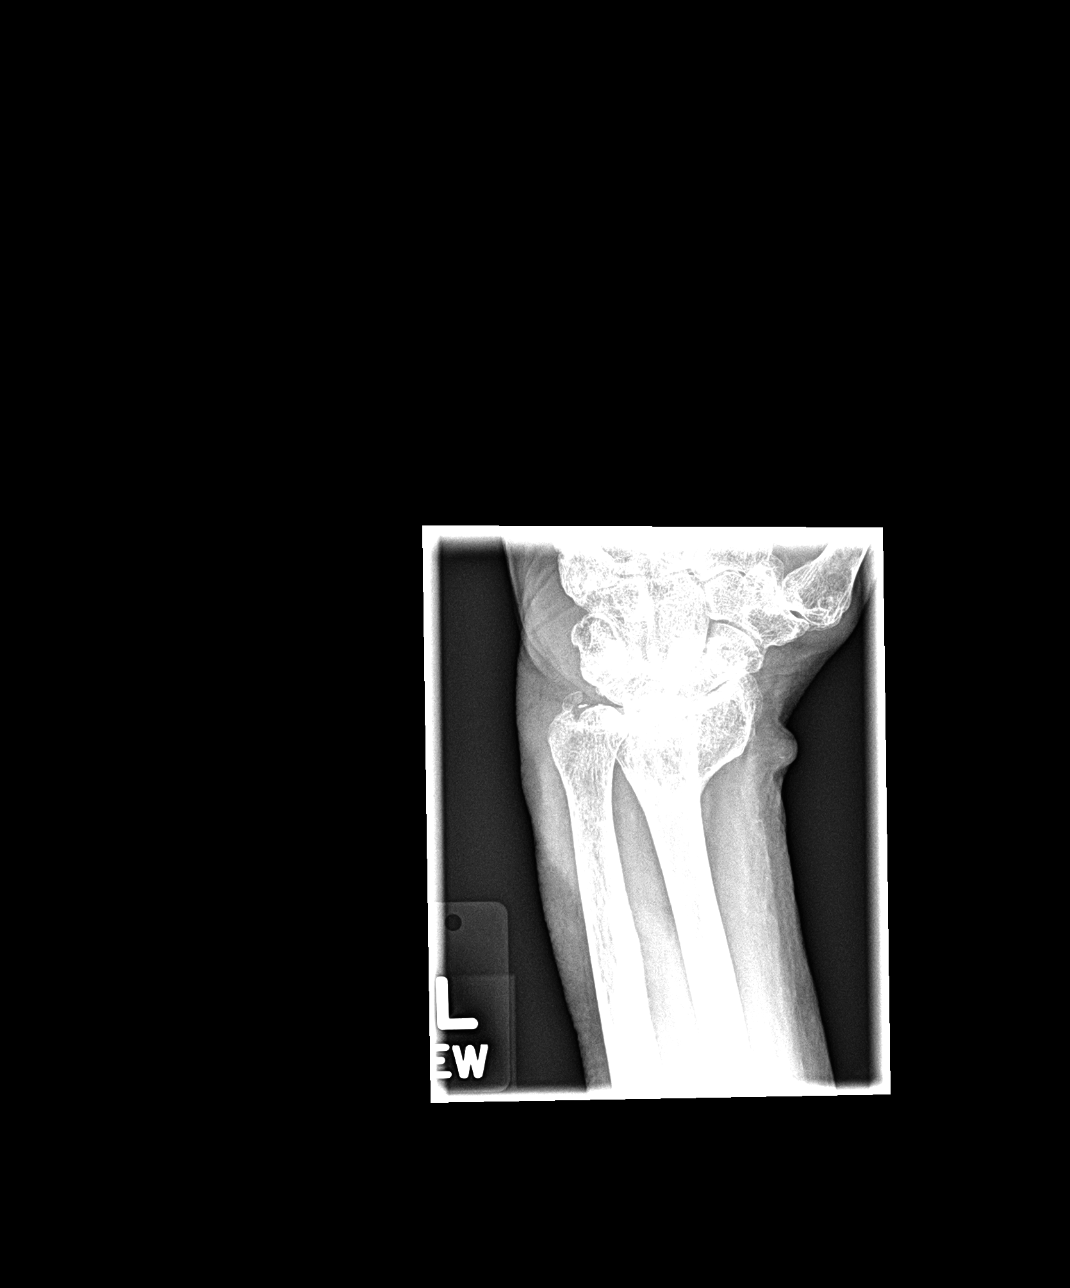

[view not recorded (2 of 4)]
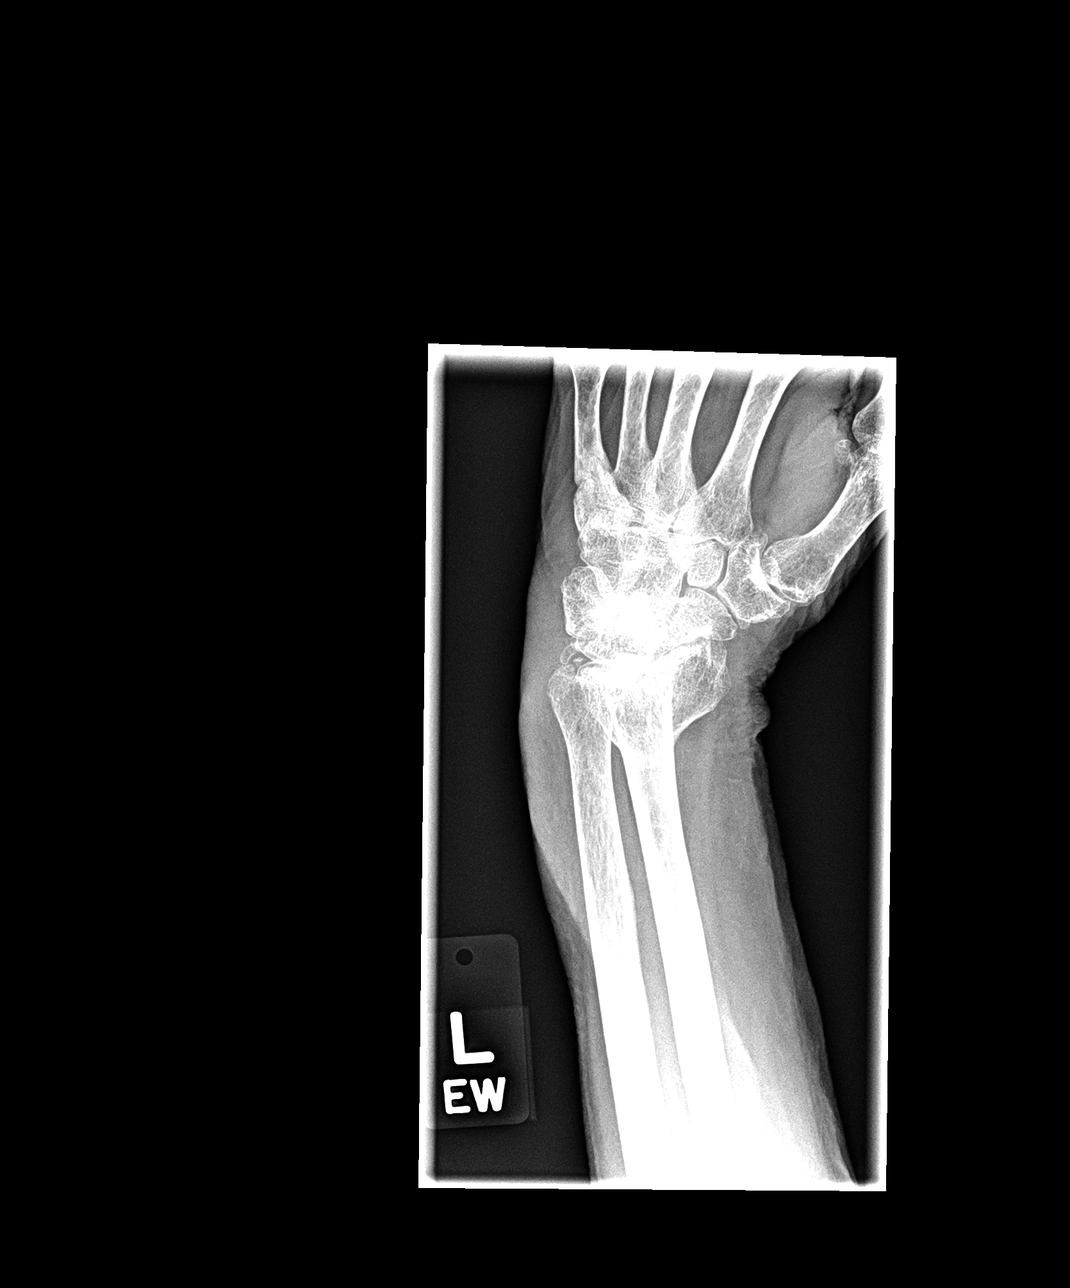

[view not recorded (3 of 4)]
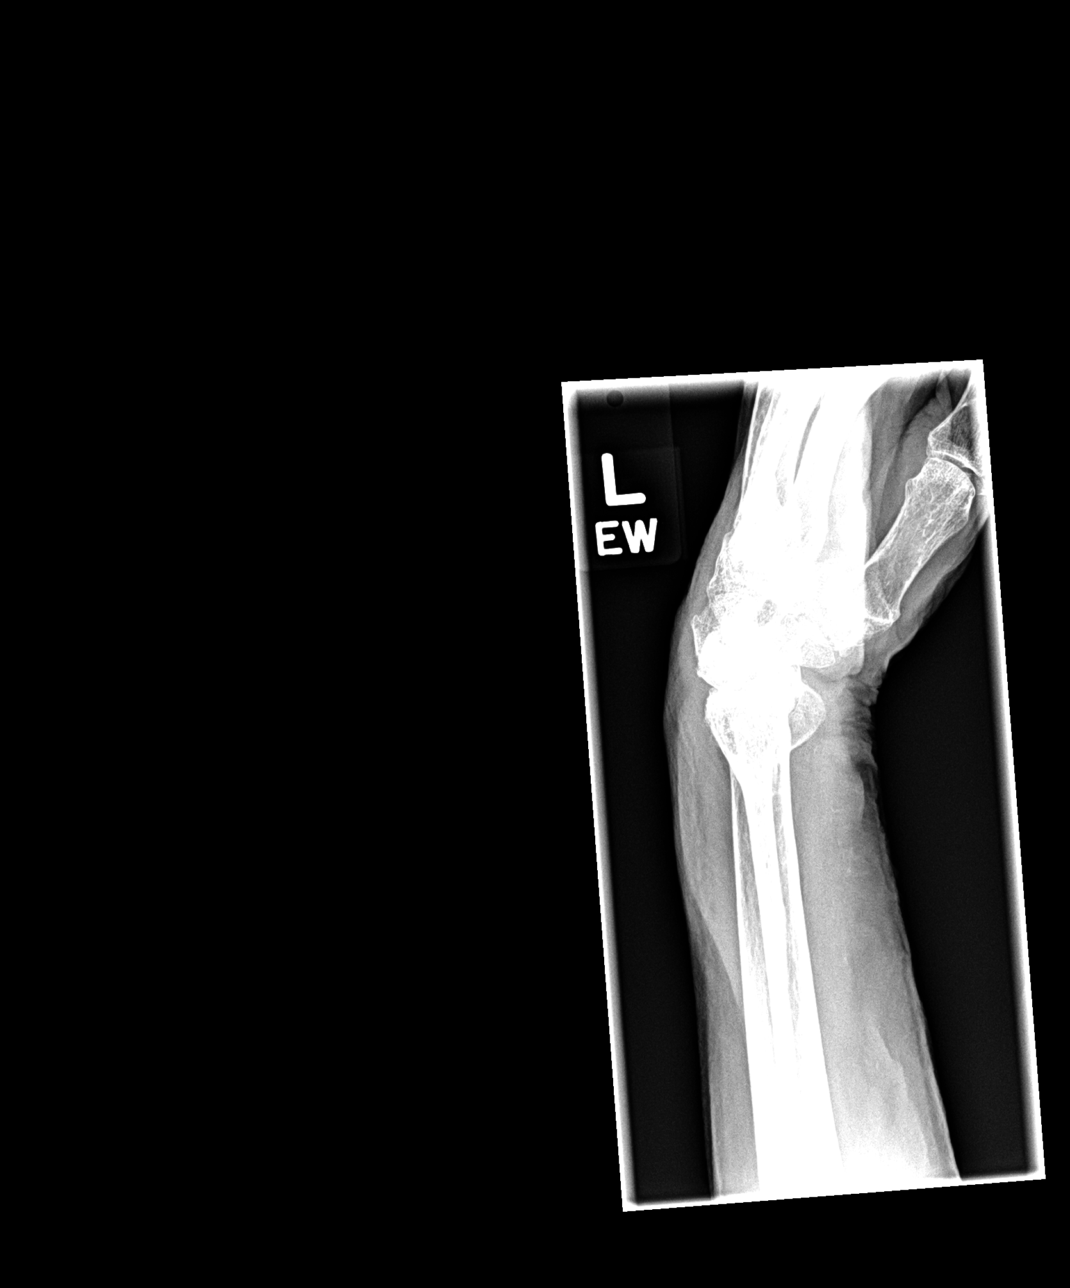

[view not recorded (4 of 4)]
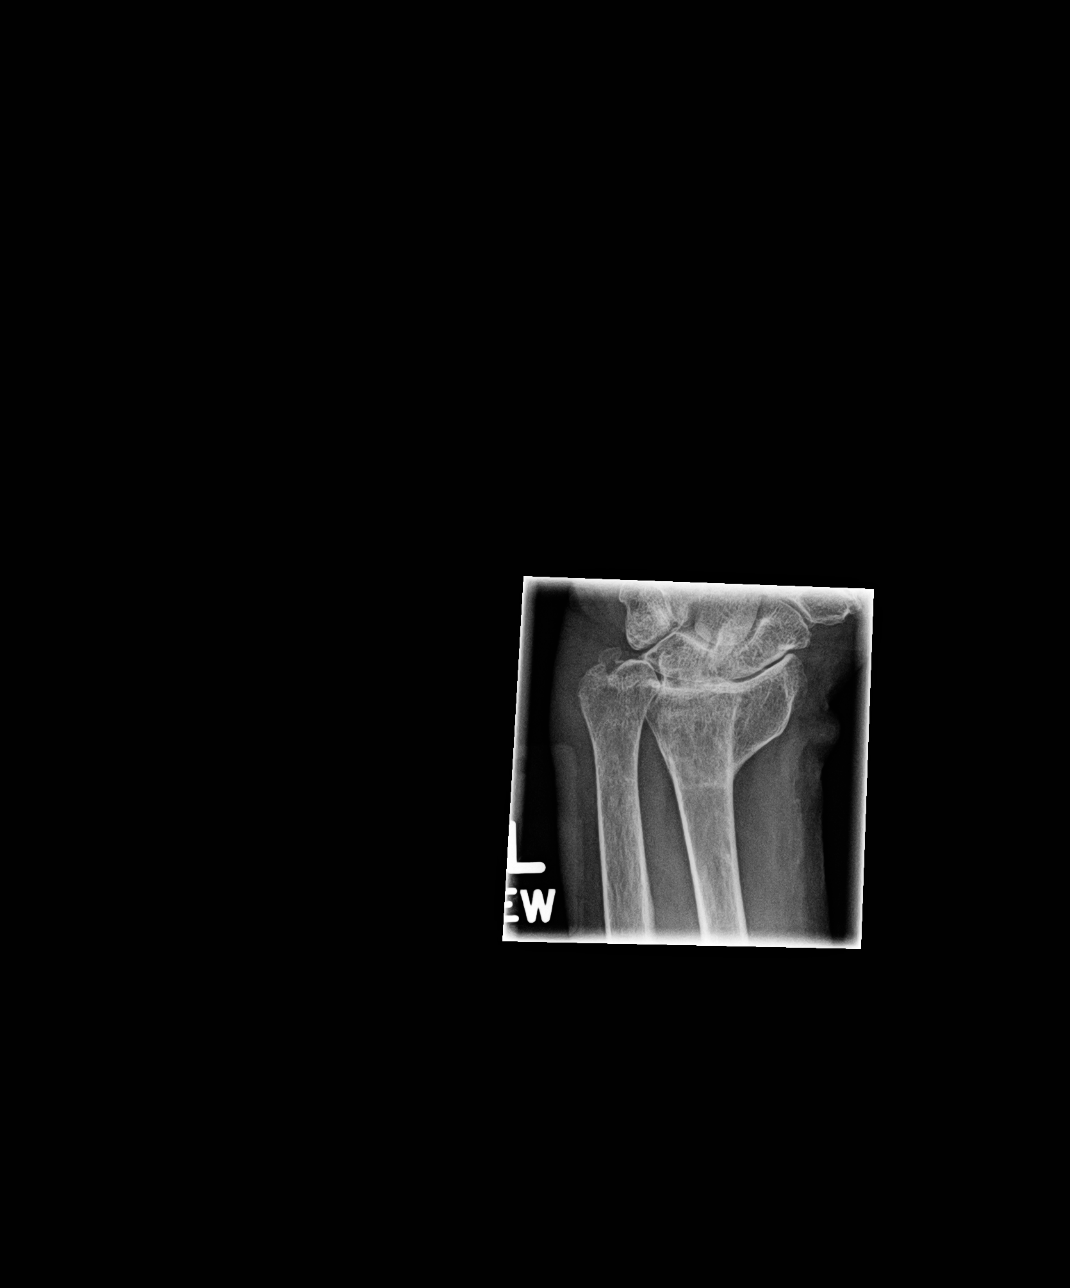

[4 of 4 positions shown; findings below may reference images not displayed]

FINDINGS: Osseous demineralization.

Old distal left radial metaphyseal and ulnar styloid fractures.

Radiocarpal joint space narrowing.

Soft tissue swelling at wrist and distal forearm.

No definite acute fracture, dislocation, or bone destruction.

Mild ulnar plus variance.
IMPRESSION: Old distal left radial metaphyseal and ulnar styloid fractures.

No definite acute osseous findings.

## 2016-02-11 ENCOUNTER — Telehealth: Payer: Self-pay

## 2016-02-11 NOTE — Telephone Encounter (Signed)
Waiting on payment of $67.50 for 148 pages from [facility].

## 2016-02-21 NOTE — Telephone Encounter (Signed)
Payment received and records were faxed on 02/21/16 to [facility]

## 2022-05-03 DEATH — deceased
